# Patient Record
Sex: Male | Born: 1960 | Race: Black or African American | Hispanic: No | Smoking: Former smoker
Health system: Southern US, Community
[De-identification: ages and names within clinical notes are randomized; demographics above are authoritative.]

## PROBLEM LIST (undated history)

## (undated) ENCOUNTER — Emergency Department (HOSPITAL_COMMUNITY): Admission: EM | Payer: Medicaid Other | Source: Home / Self Care

## (undated) VITALS — BP 134/92 | HR 88 | Temp 97.7°F | Resp 18 | Ht 73.0 in | Wt 209.4 lb

## (undated) DIAGNOSIS — F259 Schizoaffective disorder, unspecified: Secondary | ICD-10-CM

## (undated) DIAGNOSIS — I10 Essential (primary) hypertension: Secondary | ICD-10-CM

## (undated) DIAGNOSIS — R569 Unspecified convulsions: Secondary | ICD-10-CM

## (undated) DIAGNOSIS — F3130 Bipolar disorder, current episode depressed, mild or moderate severity, unspecified: Secondary | ICD-10-CM

## (undated) DIAGNOSIS — F250 Schizoaffective disorder, bipolar type: Secondary | ICD-10-CM

## (undated) DIAGNOSIS — F419 Anxiety disorder, unspecified: Secondary | ICD-10-CM

## (undated) HISTORY — PX: HERNIA REPAIR: SHX51

## (undated) HISTORY — PX: CLOSED REDUCTION WITH HUMER PIN INSERTION: SHX5010

## (undated) HISTORY — PX: ANKLE SURGERY: SHX546

## (undated) NOTE — *Deleted (*Deleted)
Adult Psychoeducational Group Not Date:  01/31/2020 Time:  0900-1045 Group Topic/Focus: PROGRESSIVE RELAXATION. A group where deep breathing is taught and tensing and relaxation muscle groups is used. Imagery is used as well.  Pts are asked to imagine 3 pillars that hold them up when they are not able to hold themselves up.  Participation Level:  Active  Participation Quality:  Appropriate  Affect:  Appropriate  Cognitive:  Oriented  Insight: Improving  Engagement in Group:  Engaged  Modes of Intervention:  Activity, Discussion, Education, and Support  Additional Comments:  ***  [facility] A 01/31/2020`  

---

## 2011-09-02 ENCOUNTER — Emergency Department (HOSPITAL_COMMUNITY)
Admission: EM | Admit: 2011-09-02 | Discharge: 2011-09-04 | Disposition: A | Discharge status: Other Acute Inpatient Hospital | Payer: Self-pay | Attending: Emergency Medicine | Admitting: Emergency Medicine

## 2011-09-02 ENCOUNTER — Encounter (HOSPITAL_COMMUNITY): Payer: Self-pay | Admitting: Emergency Medicine

## 2011-09-02 DIAGNOSIS — F32A Depression, unspecified: Secondary | ICD-10-CM

## 2011-09-02 DIAGNOSIS — F3130 Bipolar disorder, current episode depressed, mild or moderate severity, unspecified: Secondary | ICD-10-CM | POA: Insufficient documentation

## 2011-09-02 DIAGNOSIS — F319 Bipolar disorder, unspecified: Secondary | ICD-10-CM

## 2011-09-02 DIAGNOSIS — R45851 Suicidal ideations: Secondary | ICD-10-CM | POA: Insufficient documentation

## 2011-09-02 DIAGNOSIS — F329 Major depressive disorder, single episode, unspecified: Secondary | ICD-10-CM

## 2011-09-02 DIAGNOSIS — I10 Essential (primary) hypertension: Secondary | ICD-10-CM | POA: Insufficient documentation

## 2011-09-02 HISTORY — DX: Bipolar disorder, current episode depressed, mild or moderate severity, unspecified: F31.30

## 2011-09-02 HISTORY — DX: Essential (primary) hypertension: I10

## 2011-09-02 HISTORY — DX: Schizoaffective disorder, bipolar type: F25.0

## 2011-09-02 HISTORY — DX: Schizoaffective disorder, unspecified: F25.9

## 2011-09-02 LAB — RAPID URINE DRUG SCREEN, HOSP PERFORMED
Amphetamines: NOT DETECTED
Barbiturates: NOT DETECTED
Benzodiazepines: NOT DETECTED
Cocaine: POSITIVE — AB
Opiates: NOT DETECTED
Tetrahydrocannabinol: POSITIVE — AB

## 2011-09-02 LAB — COMPREHENSIVE METABOLIC PANEL
ALT: 20 U/L (ref 0–53)
AST: 29 U/L (ref 0–37)
Albumin: 4 g/dL (ref 3.5–5.2)
Alkaline Phosphatase: 47 U/L (ref 39–117)
BUN: 23 mg/dL (ref 6–23)
CO2: 26 mEq/L (ref 19–32)
Calcium: 9 mg/dL (ref 8.4–10.5)
Chloride: 104 mEq/L (ref 96–112)
Creatinine, Ser: 0.99 mg/dL (ref 0.50–1.35)
GFR calc Af Amer: >90 mL/min (ref >90)
GFR calc non Af Amer: >90 mL/min (ref >90)
Glucose, Bld: 103 mg/dL — ABNORMAL HIGH (ref 70–99)
Potassium: 3.9 mEq/L (ref 3.5–5.1)
Sodium: 139 mEq/L (ref 135–145)
Total Bilirubin: 0.4 mg/dL (ref 0.3–1.2)
Total Protein: 7.2 g/dL (ref 6.0–8.3)

## 2011-09-02 LAB — DIFFERENTIAL
Basophils Absolute: 0 10*3/uL (ref 0.0–0.1)
Basophils Relative: 0 % (ref 0–1)
Eosinophils Absolute: 0.1 10*3/uL (ref 0.0–0.7)
Eosinophils Relative: 3 % (ref 0–5)
Lymphocytes Relative: 28 % (ref 12–46)
Lymphs Abs: 1 10*3/uL (ref 0.7–4.0)
Monocytes Absolute: 0.6 10*3/uL (ref 0.1–1.0)
Monocytes Relative: 15 % — ABNORMAL HIGH (ref 3–12)
Neutro Abs: 2 10*3/uL (ref 1.7–7.7)
Neutrophils Relative %: 54 % (ref 43–77)

## 2011-09-02 LAB — CBC
HCT: 45.2 % (ref 39.0–52.0)
Hemoglobin: 15.4 g/dL (ref 13.0–17.0)
MCH: 26.8 pg (ref 26.0–34.0)
MCHC: 34.1 g/dL (ref 30.0–36.0)
MCV: 78.6 fL (ref 78.0–100.0)
Platelets: 247 10*3/uL (ref 150–400)
RBC: 5.75 MIL/uL (ref 4.22–5.81)
RDW: 14.4 % (ref 11.5–15.5)
WBC: 3.6 10*3/uL — ABNORMAL LOW (ref 4.0–10.5)

## 2011-09-02 LAB — URINALYSIS, ROUTINE W REFLEX MICROSCOPIC
Glucose, UA: NEGATIVE mg/dL
Hgb urine dipstick: NEGATIVE
Ketones, ur: NEGATIVE mg/dL
Leukocytes, UA: NEGATIVE
Nitrite: NEGATIVE
Protein, ur: NEGATIVE mg/dL
Specific Gravity, Urine: 1.033 — ABNORMAL HIGH (ref 1.005–1.030)
Urobilinogen, UA: 1 mg/dL (ref 0.0–1.0)
pH: 5.5 (ref 5.0–8.0)

## 2011-09-02 LAB — ETHANOL: Alcohol, Ethyl (B): <11 mg/dL (ref 0–11)

## 2011-09-02 MED ORDER — HALOPERIDOL 2 MG PO TABS
2.0000 mg | ORAL_TABLET | Freq: Two times a day (BID) | ORAL | Status: DC
  Administered 2011-09-02 – 2011-09-04 (×4): 2 mg via ORAL
  Filled 2011-09-02 (×4): qty 1

## 2011-09-02 MED ORDER — DIVALPROEX SODIUM 250 MG PO DR TAB
250.0000 mg | DELAYED_RELEASE_TABLET | Freq: Three times a day (TID) | ORAL | Status: DC
  Administered 2011-09-02: 250 mg via ORAL
  Filled 2011-09-02 (×3): qty 1

## 2011-09-02 MED ORDER — ACETAMINOPHEN 325 MG PO TABS: 650.0000 mg | ORAL_TABLET | ORAL | Status: DC | PRN

## 2011-09-02 MED ORDER — GABAPENTIN 300 MG PO CAPS
300.0000 mg | ORAL_CAPSULE | Freq: Three times a day (TID) | ORAL | Status: DC
  Administered 2011-09-02 – 2011-09-04 (×7): 300 mg via ORAL
  Filled 2011-09-02 (×11): qty 1

## 2011-09-02 MED ORDER — ALUM & MAG HYDROXIDE-SIMETH 200-200-20 MG/5ML PO SUSP: 30.0000 mL | ORAL | Status: DC | PRN

## 2011-09-02 MED ORDER — CLONIDINE HCL 0.1 MG PO TABS
0.2000 mg | ORAL_TABLET | Freq: Two times a day (BID) | ORAL | Status: DC
  Administered 2011-09-02 – 2011-09-04 (×5): 0.2 mg via ORAL
  Filled 2011-09-02 (×3): qty 2
  Filled 2011-09-02: qty 1
  Filled 2011-09-02: qty 2
  Filled 2011-09-02: qty 1

## 2011-09-02 MED ORDER — ZOLPIDEM TARTRATE 5 MG PO TABS
5.0000 mg | ORAL_TABLET | Freq: Every evening | ORAL | Status: DC | PRN
  Administered 2011-09-03: 5 mg via ORAL
  Filled 2011-09-02: qty 1

## 2011-09-02 MED ORDER — BENZTROPINE MESYLATE 1 MG PO TABS
0.5000 mg | ORAL_TABLET | Freq: Two times a day (BID) | ORAL | Status: DC
  Administered 2011-09-02 (×2): 0.5 mg via ORAL
  Filled 2011-09-02 (×3): qty 1

## 2011-09-02 MED ORDER — LORAZEPAM 1 MG PO TABS: 1.0000 mg | ORAL_TABLET | Freq: Three times a day (TID) | ORAL | Status: DC | PRN

## 2011-09-02 MED ORDER — IBUPROFEN 600 MG PO TABS: 600.0000 mg | ORAL_TABLET | Freq: Three times a day (TID) | ORAL | Status: DC | PRN

## 2011-09-02 MED ORDER — PANTOPRAZOLE SODIUM 40 MG PO TBEC
40.0000 mg | DELAYED_RELEASE_TABLET | Freq: Every day | ORAL | Status: DC
  Administered 2011-09-02 – 2011-09-04 (×3): 40 mg via ORAL
  Filled 2011-09-02 (×3): qty 1

## 2011-09-02 MED ORDER — BENZTROPINE MESYLATE 1 MG/ML IJ SOLN: 1.0000 mg | Freq: Two times a day (BID) | INTRAMUSCULAR | Status: DC

## 2011-09-02 MED ORDER — ONDANSETRON HCL 4 MG PO TABS: 4.0000 mg | ORAL_TABLET | Freq: Three times a day (TID) | ORAL | Status: DC | PRN

## 2011-09-02 MED ORDER — NICOTINE 7 MG/24HR TD PT24
7.0000 mg | MEDICATED_PATCH | Freq: Every day | TRANSDERMAL | Status: DC
  Administered 2011-09-02 – 2011-09-04 (×3): 7 mg via TRANSDERMAL
  Filled 2011-09-02 (×3): qty 1

## 2011-09-02 MED ORDER — DIVALPROEX SODIUM 500 MG PO DR TAB
500.0000 mg | DELAYED_RELEASE_TABLET | Freq: Two times a day (BID) | ORAL | Status: DC
  Administered 2011-09-02 – 2011-09-04 (×4): 500 mg via ORAL
  Filled 2011-09-02 (×4): qty 1

## 2011-09-02 NOTE — ED Notes (Addendum)
Pt has h/o of bipolar and schizophrenia, has not taken his medications in several weeks d/t inability to afford them.  Pt called [facility] to [facility] to [facility] unable to accept him and sent him here.  Pt here voluntarily, pt calm and cooperative at this time. Pt reports SI this am without plan, denies HI.

## 2011-09-02 NOTE — ED Provider Notes (Signed)
Pt had telepsych consult.  He rec'd admission.  Continue IVC papers.  He rec'd cogentin, haldol, and vpa.  I ordered all of these. Act consulted for placement.  Cheri Guppy, MD 09/02/11 2123

## 2011-09-02 NOTE — ED Notes (Signed)
Up to the desk on the phone 
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
## 2011-09-02 NOTE — ED Notes (Signed)
Pt to BR to give urine specimen

## 2011-09-02 NOTE — BHH Counselor (Signed)
Trisha Mangle MD telepsych consult recommends inpatient treatment and continue IVC. However, pt is not under IVC. EDP made aware of recommendation and Trisha Mangle incorrectly thinking pt under IVC.Marland Kitchen

## 2011-09-02 NOTE — ED Provider Notes (Signed)
History     CSN: [id number]  Arrival date & time 09/02/11  1039   First MD Initiated Contact with Patient 09/02/11 1059      Chief Complaint  Patient presents with  . V70.1    (Consider location/radiation/quality/duration/timing/severity/associated sxs/prior treatment) The history is provided by the patient. The history is limited by the condition of the patient.   51 year old male comes in because of suicidal ideation. He states that he relocated here recently and out of his medications 2 weeks ago. He has not taken anything during that time. He started having suicidal ideation last night. He states that he had a knife and was going to cut himself with a knife was taken away from him. He has had symptoms of depression with early morning awakening, crying spells, and anhedonia. He has had visual hallucinations seeing his mother and grandmother for both red. He has had auditory hallucinations with voices telling him to kill himself. He admits to drinking 4 beers last night each which were 24 ounces and also using marijuana last night. He denies other drug use. He is here voluntarily and mainly wants to get back on his medications.  Past Medical History  Diagnosis Date  . Bipolar affective disorder, depressed   . Schizophrenia, schizo-affective   . Hypertension     History reviewed. No pertinent past surgical history.  History reviewed. No pertinent family history.  History  Substance Use Topics  . Smoking status: Not on file  . Smokeless tobacco: Not on file  . Alcohol Use:       Review of Systems  All other systems reviewed and are negative.    Allergies  Review of patient's allergies indicates no known allergies.  Home Medications   Current Outpatient Rx  Name Route Sig Dispense Refill  . BENZTROPINE MESYLATE 0.5 MG PO TABS Oral Take 0.5 mg by mouth 2 (two) times daily.    Marland Kitchen CLONIDINE HCL 0.2 MG PO TABS Oral Take 0.2 mg by mouth 2 (two) times daily.    Marland Kitchen  DIVALPROEX SODIUM 250 MG PO TBEC Oral Take 250 mg by mouth 3 (three) times daily.    Marland Kitchen GABAPENTIN 300 MG PO CAPS Oral Take 300 mg by mouth 3 (three) times daily.      BP 159/93  Temp 98.3 F (36.8 C) (Oral)  Resp 14  Ht 6\' 1"  (1.854 m)  Wt 215 lb (97.523 kg)  BMI 28.37 kg/m2  SpO2 98%  Physical Exam  Nursing note and vitals reviewed.  51 year old male who is pleasant and alert and in no acute distress. Vital signs are significant for mild hypertension with blood pressure 159/93. Oxygen saturation is 98% which is normal. Head is normocephalic and atraumatic. PERRLA, EOMI. Oropharynx is clear. Neck is nontender and supple. Back is nontender. Lungs are clear without rales, wheezes, or rhonchi. Heart has regular rate and rhythm without murmur. Abdomen is soft, flat, nontender without masses or hepatosplenomegaly. Extremities have no cyanosis or edema, full range of motion is present. Skin is warm and dry without rash. Neurologic: He is awake, alert, oriented x3, cranial nerves are intact,  there are no motor or sensory deficits. Psychiatric: He appears mildly depressed.  ED Course  Procedures (including critical care time)  Results for orders placed during the hospital encounter of 09/02/11  CBC      Component Value Range   WBC 3.6 (*) 4.0 - 10.5 K/uL   RBC 5.75  4.22 - 5.81 MIL/uL   Hemoglobin 15.4  13.0 - 17.0 g/dL   HCT 96.0  45.4 - 09.8 %   MCV 78.6  78.0 - 100.0 fL   MCH 26.8  26.0 - 34.0 pg   MCHC 34.1  30.0 - 36.0 g/dL   RDW 11.9  14.7 - 82.9 %   Platelets 247  150 - 400 K/uL  DIFFERENTIAL      Component Value Range   Neutrophils Relative 54  43 - 77 %   Neutro Abs 2.0  1.7 - 7.7 K/uL   Lymphocytes Relative 28  12 - 46 %   Lymphs Abs 1.0  0.7 - 4.0 K/uL   Monocytes Relative 15 (*) 3 - 12 %   Monocytes Absolute 0.6  0.1 - 1.0 K/uL   Eosinophils Relative 3  0 - 5 %   Eosinophils Absolute 0.1  0.0 - 0.7 K/uL   Basophils Relative 0  0 - 1 %   Basophils Absolute 0.0  0.0 -  0.1 K/uL  COMPREHENSIVE METABOLIC PANEL      Component Value Range   Sodium 139  135 - 145 mEq/L   Potassium 3.9  3.5 - 5.1 mEq/L   Chloride 104  96 - 112 mEq/L   CO2 26  19 - 32 mEq/L   Glucose, Bld 103 (*) 70 - 99 mg/dL   BUN 23  6 - 23 mg/dL   Creatinine, Ser 5.62  0.50 - 1.35 mg/dL   Calcium 9.0  8.4 - 13.0 mg/dL   Total Protein 7.2  6.0 - 8.3 g/dL   Albumin 4.0  3.5 - 5.2 g/dL   AST 29  0 - 37 U/L   ALT 20  0 - 53 U/L   Alkaline Phosphatase 47  39 - 117 U/L   Total Bilirubin 0.4  0.3 - 1.2 mg/dL   GFR calc non Af Amer >90  >90 mL/min   GFR calc Af Amer >90  >90 mL/min  ETHANOL      Component Value Range   Alcohol, Ethyl (B) <11  0 - 11 mg/dL  URINE RAPID DRUG SCREEN (HOSP PERFORMED)      Component Value Range   Opiates NONE DETECTED  NONE DETECTED   Cocaine POSITIVE (*) NONE DETECTED   Benzodiazepines NONE DETECTED  NONE DETECTED   Amphetamines NONE DETECTED  NONE DETECTED   Tetrahydrocannabinol POSITIVE (*) NONE DETECTED   Barbiturates NONE DETECTED  NONE DETECTED  URINALYSIS, ROUTINE W REFLEX MICROSCOPIC      Component Value Range   Color, Urine AMBER (*) YELLOW   APPearance CLEAR  CLEAR   Specific Gravity, Urine 1.033 (*) 1.005 - 1.030   pH 5.5  5.0 - 8.0   Glucose, UA NEGATIVE  NEGATIVE mg/dL   Hgb urine dipstick NEGATIVE  NEGATIVE   Bilirubin Urine SMALL (*) NEGATIVE   Ketones, ur NEGATIVE  NEGATIVE mg/dL   Protein, ur NEGATIVE  NEGATIVE mg/dL   Urobilinogen, UA 1.0  0.0 - 1.0 mg/dL   Nitrite NEGATIVE  NEGATIVE   Leukocytes, UA NEGATIVE  NEGATIVE   No results found.    1. Suicidal ideation   2. Depression   3. Bipolar disorder   4. Hypertension       MDM  Depression with suicidal ideation and visual and auditory hallucinations. He he'll be placed in the psychiatric emergency department and restarted on his medications. Psychiatry and ACT Team consultations will be obtained. He has no prior records and the [facility]  system.  Dione Booze, MD 09/03/11 1100

## 2011-09-02 NOTE — ED Notes (Signed)
telepsych in progress 

## 2011-09-03 MED ORDER — BENZTROPINE MESYLATE 1 MG PO TABS
1.0000 mg | ORAL_TABLET | Freq: Two times a day (BID) | ORAL | Status: DC
  Administered 2011-09-03 – 2011-09-04 (×3): 1 mg via ORAL
  Filled 2011-09-03 (×2): qty 1

## 2011-09-03 NOTE — ED Notes (Signed)
Pt attended [facility] ED group facilitated by Chaplain Burnis Kingfisher and PhD Counseling intern Juanetta Beets.    Group focused on concept of "journey," encouraging pt's to conceptualize their hospitalization in a larger narrative context and describe concrete next step.   After introductions brief check-in, Pt's worked to [employer] connected with the concept of journey and described how these words connected with their own narrative.  Pt's then selected a photograph and spoke about how this picture symbolizes their journey.  Pt's together decided on a theme of longing for peace in their journey.     Pt introduced self to group and described history of incarceration.  He spoke with group about difficulty finding stability and his desire to be in a safe and stable place.  Described being off his medication and that he was hearing voices. Pt reported that the photographs that were on the table in the room were prompting some voices in his head.  Pt moved to other side of room when group changed to work on an exercise.   Pt remained in group throughout session, but did not engage except when facilitators checked in with him.  He reported that he was feeling ill and was sick to his stomach.    Belva Crome  MDiv, Chaplain
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
## 2011-09-03 NOTE — ED Notes (Signed)
When asked, pt states he has a hx of seizures with the last one being 1.5 to 2 months ago.  Also states he is currently seeing "dead people that he knows"

## 2011-09-03 NOTE — ED Notes (Signed)
Pt reports he is having racing thoughts and AH telling him to hurt himself while he is sleeping, pt reports he wakes up out of his sleep and just stays awake, pt states he is not going to act on the thoughts. Pt pleasant, cooperative in room watching television

## 2011-09-04 ENCOUNTER — Encounter (HOSPITAL_COMMUNITY): Payer: Self-pay | Admitting: *Deleted

## 2011-09-04 ENCOUNTER — Inpatient Hospital Stay (HOSPITAL_COMMUNITY)
Admission: AD | Admit: 2011-09-04 | Discharge: 2011-09-10 | DRG: 897 | Disposition: A | Discharge status: Home / Self Care | Payer: Federal, State, Local not specified - Other | Source: Ambulatory Visit | Attending: Psychiatry | Admitting: Psychiatry

## 2011-09-04 DIAGNOSIS — F1220 Cannabis dependence, uncomplicated: Secondary | ICD-10-CM | POA: Diagnosis present

## 2011-09-04 DIAGNOSIS — G40802 Other epilepsy, not intractable, without status epilepticus: Secondary | ICD-10-CM | POA: Diagnosis present

## 2011-09-04 DIAGNOSIS — Z79899 Other long term (current) drug therapy: Secondary | ICD-10-CM

## 2011-09-04 DIAGNOSIS — F1910 Other psychoactive substance abuse, uncomplicated: Secondary | ICD-10-CM

## 2011-09-04 DIAGNOSIS — F1020 Alcohol dependence, uncomplicated: Secondary | ICD-10-CM | POA: Diagnosis present

## 2011-09-04 DIAGNOSIS — F1410 Cocaine abuse, uncomplicated: Secondary | ICD-10-CM | POA: Diagnosis present

## 2011-09-04 DIAGNOSIS — F39 Unspecified mood [affective] disorder: Secondary | ICD-10-CM

## 2011-09-04 DIAGNOSIS — F1994 Other psychoactive substance use, unspecified with psychoactive substance-induced mood disorder: Principal | ICD-10-CM | POA: Diagnosis present

## 2011-09-04 DIAGNOSIS — I10 Essential (primary) hypertension: Secondary | ICD-10-CM | POA: Diagnosis present

## 2011-09-04 DIAGNOSIS — F1920 Other psychoactive substance dependence, uncomplicated: Secondary | ICD-10-CM | POA: Diagnosis present

## 2011-09-04 HISTORY — DX: Anxiety disorder, unspecified: F41.9

## 2011-09-04 HISTORY — DX: Unspecified convulsions: R56.9

## 2011-09-04 MED ORDER — CHLORDIAZEPOXIDE HCL 25 MG PO CAPS
25.0000 mg | ORAL_CAPSULE | Freq: Four times a day (QID) | ORAL | Status: AC | PRN
  Administered 2011-09-06 – 2011-09-07 (×2): 25 mg via ORAL
  Filled 2011-09-04 (×2): qty 1

## 2011-09-04 MED ORDER — HYDROXYZINE HCL 25 MG PO TABS
25.0000 mg | ORAL_TABLET | Freq: Four times a day (QID) | ORAL | Status: AC | PRN
  Administered 2011-09-05 – 2011-09-06 (×3): 25 mg via ORAL

## 2011-09-04 MED ORDER — VITAMIN B-1 100 MG PO TABS
100.0000 mg | ORAL_TABLET | Freq: Every day | ORAL | Status: DC
  Administered 2011-09-05 – 2011-09-10 (×6): 100 mg via ORAL
  Filled 2011-09-04 (×7): qty 1

## 2011-09-04 MED ORDER — NICOTINE 14 MG/24HR TD PT24
14.0000 mg | MEDICATED_PATCH | Freq: Every day | TRANSDERMAL | Status: DC
  Administered 2011-09-05 – 2011-09-10 (×6): 14 mg via TRANSDERMAL
  Filled 2011-09-04 (×9): qty 1

## 2011-09-04 MED ORDER — LOPERAMIDE HCL 2 MG PO CAPS: 2.0000 mg | ORAL_CAPSULE | ORAL | Status: AC | PRN

## 2011-09-04 MED ORDER — MAGNESIUM HYDROXIDE 400 MG/5ML PO SUSP
30.0000 mL | Freq: Every day | ORAL | Status: DC | PRN
Start: 2011-09-04 — End: 2011-09-10

## 2011-09-04 MED ORDER — ADULT MULTIVITAMIN W/MINERALS CH
1.0000 | ORAL_TABLET | Freq: Every day | ORAL | Status: DC
  Administered 2011-09-05 – 2011-09-07 (×3): 1 via ORAL
  Administered 2011-09-08: 08:00:00 via ORAL
  Administered 2011-09-09 – 2011-09-10 (×2): 1 via ORAL
  Filled 2011-09-04 (×8): qty 1

## 2011-09-04 MED ORDER — CLONIDINE HCL 0.2 MG PO TABS
0.2000 mg | ORAL_TABLET | Freq: Two times a day (BID) | ORAL | Status: DC
  Administered 2011-09-04 – 2011-09-10 (×12): 0.2 mg via ORAL
  Filled 2011-09-04 (×7): qty 1
  Filled 2011-09-04: qty 2
  Filled 2011-09-04 (×7): qty 1

## 2011-09-04 MED ORDER — DIVALPROEX SODIUM 250 MG PO DR TAB
250.0000 mg | DELAYED_RELEASE_TABLET | Freq: Three times a day (TID) | ORAL | Status: DC
  Administered 2011-09-05 – 2011-09-10 (×17): 250 mg via ORAL
  Filled 2011-09-04 (×19): qty 1

## 2011-09-04 MED ORDER — ESCITALOPRAM OXALATE 20 MG PO TABS
20.0000 mg | ORAL_TABLET | Freq: Every day | ORAL | Status: DC
  Administered 2011-09-05: 20 mg via ORAL
  Filled 2011-09-04 (×2): qty 1

## 2011-09-04 MED ORDER — CLONIDINE HCL 0.2 MG PO TABS: 0.2000 mg | ORAL_TABLET | Freq: Three times a day (TID) | ORAL | Status: DC

## 2011-09-04 MED ORDER — ACETAMINOPHEN 325 MG PO TABS
650.0000 mg | ORAL_TABLET | Freq: Four times a day (QID) | ORAL | Status: DC | PRN
Start: 2011-09-04 — End: 2011-09-10
  Administered 2011-09-05: 650 mg via ORAL

## 2011-09-04 MED ORDER — ALUM & MAG HYDROXIDE-SIMETH 200-200-20 MG/5ML PO SUSP: 30.0000 mL | ORAL | Status: DC | PRN

## 2011-09-04 MED ORDER — THIAMINE HCL 100 MG/ML IJ SOLN
100.0000 mg | Freq: Once | INTRAMUSCULAR | Status: AC
  Administered 2011-09-04: 100 mg via INTRAMUSCULAR

## 2011-09-04 MED ORDER — BENZTROPINE MESYLATE 0.5 MG PO TABS
0.5000 mg | ORAL_TABLET | Freq: Two times a day (BID) | ORAL | Status: DC
  Administered 2011-09-05 – 2011-09-10 (×11): 0.5 mg via ORAL
  Filled 2011-09-04 (×13): qty 1

## 2011-09-04 MED ORDER — ONDANSETRON 4 MG PO TBDP: 4.0000 mg | ORAL_TABLET | Freq: Four times a day (QID) | ORAL | Status: AC | PRN

## 2011-09-04 MED ORDER — GABAPENTIN 300 MG PO CAPS
300.0000 mg | ORAL_CAPSULE | Freq: Three times a day (TID) | ORAL | Status: DC
  Administered 2011-09-05 – 2011-09-10 (×17): 300 mg via ORAL
  Filled 2011-09-04 (×19): qty 1

## 2011-09-04 NOTE — Tx Team (Addendum)
Initial Interdisciplinary Treatment Plan  PATIENT STRENGTHS: (choose at least two) Ability for insight Average or above average intelligence Capable of independent living Communication skills General fund of knowledge Motivation for treatment/growth Physical Health Supportive family/friends  PATIENT STRESSORS: Financial difficulties Medication change or noncompliance Substance abuse   PROBLEM LIST: Problem List/Patient Goals Date to be addressed Date deferred Reason deferred Estimated date of resolution  Depression      SI      Anxiety      Polysubstance abuse      Thought Disorder                               DISCHARGE CRITERIA:  Ability to meet basic life and health needs Adequate post-discharge living arrangements Improved stabilization in mood, thinking, and/or behavior Medical problems require only outpatient monitoring Motivation to continue treatment in a less acute level of care Need for constant or close observation no longer present Reduction of life-threatening or endangering symptoms to within safe limits Safe-care adequate arrangements made Verbal commitment to aftercare and medication compliance Withdrawal symptoms are absent or subacute and managed without 24-hour nursing intervention  PRELIMINARY DISCHARGE PLAN: Attend 12-step recovery group Outpatient therapy  PATIENT/FAMIILY INVOLVEMENT: This treatment plan has been presented to and reviewed with the patient, [facility].  The patient has been given the opportunity to ask questions and make suggestions.  Arturo Morton 09/04/2011, 8:49 PM

## 2011-09-04 NOTE — ED Provider Notes (Signed)
  Physical Exam  BP 129/82  Pulse 53  Temp 98.2 F (36.8 C) (Oral)  Resp 18  Ht 6\' 1"  (1.854 m)  Wt 215 lb (97.523 kg)  BMI 28.37 kg/m2  SpO2 100%  Physical Exam  ED Course  Procedures  MDM Accepted at behavioral health. Dr readling      Harrold Donath R. Rubin Payor, MD 09/04/11 1751
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
## 2011-09-04 NOTE — BHH Counselor (Cosign Needed)
Referred to [facility] and declined due to patient acuity. Pt accepted by Dr. Harvie Heck Readling to Dr. Koren Shiver.  Pending a 400 hall bed.

## 2011-09-04 NOTE — Consult Note (Signed)
Reason for Consult: Polysubstance abuse versus dependence noncompliance with treatment schizophrenia, versus schizoaffective disorder Referring Physician: Dr. Alexia Freestone is an 51 y.o. male.  HPI: He patient was seen and chart reviewed. Patient reported he has been suffering with the chronic psychiatric illness, since he was living in [address] [place], [place], and was hospitalized years ago. Patient reportedly relocated to [place], [place], and was hospitalized at [facility] for 28 days and than transferred to this central regional hospitalization where he stayed another  28 days before he was released to his sister's home. Patient reported to much stress over her sister's home, so he was relocated to [place], staying with his cousin for last 2 weeks. Patient stopped taking his medications because he ran out and started using drugs by hanging around with the wrong crowd. Patient has history of for multi-year incarceration for multi-legal troubles. Patient reportedly has no current pending charges. Patient is suffering with the emotional problems including racing thoughts, lack of focus, getting frustrated, tearful, and requesting to stabilize on his medication management and help with a detox and rehabilitation services. Patient feels he was not helped on several places because of his legal history. He patient seems to be genuine and honest when he is giving the information about his past.   Past Medical History  Diagnosis Date  . Bipolar affective disorder, depressed   . Schizophrenia, schizo-affective   . Hypertension     History reviewed. No pertinent past surgical history.  History reviewed. No pertinent family history.  Social History:  does not have a smoking history on file. He does not have any smokeless tobacco history on file. He reports that he drinks alcohol. He reports that he uses illicit drugs (Marijuana and Cocaine).  Allergies: No Known  Allergies  Medications: I have reviewed the patient's current medications.  No results found for this or any previous visit (from the past 48 hour(s)).  No results found.  Positive for anxiety, bad mood, behavior problems, bipolar, depression, excessive alcohol consumption, illegal drug usage, mood swings and I polysubstance abuse versus dependence, and legal problems in the past Blood pressure 129/82, pulse 53, temperature 98.2 F (36.8 C), temperature source Oral, resp. rate 18, height 6\' 1"  (1.854 m), weight 215 lb (97.523 kg), SpO2 100.00%.   Assessment/Plan: Polysubstance abuse versus dependence. Schizophrenia, versus schizoaffective disorder, by history.  Recommended admission to the acute psychiatric hospitalization for safety and seclude therapeutic milieu and stabilization. Patient may need rehabilitation services upon discharge from the behavioral Health Center.  Callista Hoh,JANARDHAHA R. 09/04/2011, 5:15 PM

## 2011-09-04 NOTE — Progress Notes (Signed)
Behavioral Health Group  Co-facilitated behavioral health group w/ Corliss Marcus, MA, MEd, LPCA, NCC, for pt's in [facility]. Group was open and engaged w/ mutual sharing and support. Group focused on grief and loss, specifically losses related to mental health and substance use and how to work through grief processes associated with these losses.  Pt was progressively engaged in the group. Pt shared that he lost his mother to cancer about 16y ago and "I still haven't grieved the loss." Pt reports that he has been in prison for many yrs and now that he is out, has no idea how to survive. Pt stated "I know the basics but that's about it and the way you survive in prison ain't how you survive out here." Pt reports that he has nowhere to go, occ hears voices, and recently began drug use and ETOH to stop voices. Pt stated several times, "I'm not ready to go back to where I was before I came in here" and "I need to go somewhere inpatient to get some help." Pt stated he does not have coping skills to cope and is trying to reach out for help through [facility]. Pt related to others stories who shared about losing loved ones to cancer as well as empathizing w/ other members who stated they felt lost and scared.  Arilynn Blakeney B MS, LPCA, NCC

## 2011-09-04 NOTE — Progress Notes (Signed)
Pt has been accepted to [facility] for treatment by Dr. Allena Katz, bed 301-2. EDP has been notified and is in agreement with disposition. RN made aware to call report. All support paper work has been completed and faxed to [facility] for review. Pt is in agreement to transfer to [facility]. No further needs identified at this time.

## 2011-09-05 DIAGNOSIS — F39 Unspecified mood [affective] disorder: Secondary | ICD-10-CM

## 2011-09-05 LAB — VALPROIC ACID LEVEL: Valproic Acid Lvl: 40.1 ug/mL — ABNORMAL LOW (ref 50.0–100.0)

## 2011-09-05 LAB — TSH: TSH: 2.065 u[IU]/mL (ref 0.350–4.500)

## 2011-09-05 MED ORDER — CITALOPRAM HYDROBROMIDE 20 MG PO TABS
20.0000 mg | ORAL_TABLET | Freq: Every day | ORAL | Status: DC
  Administered 2011-09-05 – 2011-09-10 (×6): 20 mg via ORAL
  Filled 2011-09-05 (×8): qty 1

## 2011-09-05 NOTE — H&P (Signed)
Psychiatric Admission Assessment Adult  Patient Identification:  Adrian Alexander Date of Evaluation:  09/05/2011 Chief Complaint:  Polysubstance Abuse; Schizoaffective Disorder, Unspecified History of Present Illness:  This is a voluntary admission for this 51 year old AAM from [place] who has been in this area for only a few weeks.  He states he is suicidal due to being off of his medications that he normally takes.  He states he is trying to get established with [facility] and was unable to do so yet.  He is voicing auditory hallucinations and suicidal ideation.  These symptoms have worsened with the use of alcohol and cocaine.  He says he drank all night last night and only used cocaine x1.        Prior to admission he reports having poor sleep, decreased appetite, increasing depression and anhedonia, he states he is suicidal and has a plan to cut himself with a knife.  He admits he has made previous attempts at cutting his wrists.  He rates his anxiety as 10/10 and states he does have Auditory hallucinations recently as of yesterday but he could not make them out.  He denies visual hallucinations.  He notes increased hopelessness as well.       He does note seizure disorder and reports seizures with alcohol withdrawal in the past.  He states his last seizure was a month ago.  Past Psychiatric History: Diagnosis:  Schizoaffective disorder with substance abuse  Hospitalizations: 4 in the last year.  Outpatient Care: initiated at [facility]  Substance Abuse Care:  Self-Mutilation:   Suicidal Attempts: previously cut his wrists.  Violent Behaviors:   Past Medical History:   Past Medical History  . Hypertension   . Seizures     Last month. gets a sensation   Seizure History:  see HPI Allergies:  No Known Allergies PTA Medications: Prescriptions prior to admission  Medication Sig Dispense Refill  . benztropine (COGENTIN) 0.5 MG tablet Take 0.5 mg by mouth 2 (two) times daily.      . cloNIDine  (CATAPRES) 0.2 MG tablet Take 0.2 mg by mouth 2 (two) times daily.      . divalproex (DEPAKOTE) 250 MG DR tablet Take 250 mg by mouth 3 (three) times daily.      Marland Kitchen gabapentin (NEURONTIN) 300 MG capsule Take 300 mg by mouth 3 (three) times daily.        Previous Psychotropic Medications:  Medication/Dose                 Substance Abuse History in the last 12 months: Substance Age of 1st Use Last Use Amount Specific Type  Nicotine      Alcohol      Cannabis      Opiates      Cocaine      Methamphetamines      LSD      Ecstasy      Benzodiazepines      Caffeine      Inhalants      Others:                         Consequences of Substance Abuse:   Social History: Current Place of Residence:   Place of Birth:   Family Members: Marital Status:  Separated Children:  Sons:  Daughters: Relationships: Education:  4th grade Educational Problems/Performance: Religious Beliefs/Practices: History of Abuse (Emotional/Phsycial/Sexual) Occupational Experiences; Military History:  None. Legal History: Hobbies/Interests:  Family History:  No  family history on file. ROS:  Negative with the exception of the HPI. PE; Completed by the MD in the ED.  I have reviewed those findings and evaluated the patient and agree with those findings.  Mental Status Examination/Evaluation: Objective:  Appearance: Disheveled  Eye Contact::  Fair  Speech:  Clear and Coherent  Volume:  Normal  Mood:  Anxious and Depressed  Affect:  Congruent  Thought Process:  Goal Directed and Linear  Orientation:  Full  Thought Content:  WDL  Suicidal Thoughts:  No  Homicidal Thoughts:  No  Memory:  Immediate;   Fair  Judgement:  Poor  Insight:  Lacking  Psychomotor Activity:  Normal  Concentration:  Fair  Recall:  Fair  Akathisia:  No  Handed:    AIMS (if indicated):     Assets:  Communication Skills Desire for Improvement  Sleep:  Number of Hours: 6.5     Laboratory/X-Ray Psychological  Evaluation(s)  UDS-cocaine, THC BAL<11 CMP-unremarkable Valproic Acid-40       Assessment:    AXIS I:  Alcohol Dependence; Cocaine Abuse; Cannabis Dependence; BPAD vs. Schizoaffective Disorder, per Hx; Seizure Disorder NOS; HTN AXIS II:  Deferred AXIS III:   Past Medical History  Diagnosis Date  . Bipolar affective disorder, depressed   . Schizophrenia, schizo-affective   . Hypertension   . Anxiety   . Seizures     Last month. gets a sensation   AXIS IV:  housing problems, occupational problems, problems related to social environment, problems with access to health care services and problems with primary support group AXIS V:  51-60 moderate symptoms  Treatment Recommendations Summary: 1. Admit for crisis management and stabilization. 2. Detox as needed. 3. Start medications as ordered. 4. Address concerns for continued health and mental health management upon discharge. 5. Develop treatment plan to decrease risk of relapse or readmission.  Treatment Plan: 1.  Restart home medications as ordered. 2. Monitor progress. 3. Monitor for need for detox protocol based on CIWA and patient's symptoms. 4. Treat health problems as needed. 5. Establish plan for continued care and follow up on discharge. Current Medications:  Current Facility-Administered Medications  Medication Dose Route Frequency Provider Last Rate Last Dose  . acetaminophen (TYLENOL) tablet 650 mg  650 mg Oral Q6H PRN Mickeal Skinner, MD      . alum & mag hydroxide-simeth (MAALOX/MYLANTA) 200-200-20 MG/5ML suspension 30 mL  30 mL Oral Q4H PRN Mickeal Skinner, MD      . benztropine (COGENTIN) tablet 0.5 mg  0.5 mg Oral BID Mickeal Skinner, MD   0.5 mg at 09/05/11 0759  . chlordiazePOXIDE (LIBRIUM) capsule 25 mg  25 mg Oral Q6H PRN Mickeal Skinner, MD      . citalopram (CELEXA) tablet 20 mg  20 mg Oral Daily Alyson Kuroski-Mazzei, DO   20 mg at 09/05/11 1205  . cloNIDine (CATAPRES) tablet 0.2 mg  0.2 mg Oral BID  Mickeal Skinner, MD   0.2 mg at 09/05/11 0758  . divalproex (DEPAKOTE) DR tablet 250 mg  250 mg Oral TID PC Mickeal Skinner, MD   250 mg at 09/05/11 1205  . gabapentin (NEURONTIN) capsule 300 mg  300 mg Oral TID Mickeal Skinner, MD   300 mg at 09/05/11 1205  . hydrOXYzine (ATARAX/VISTARIL) tablet 25 mg  25 mg Oral Q6H PRN Mickeal Skinner, MD      . loperamide (IMODIUM) capsule 2-4 mg  2-4 mg Oral PRN Mickeal Skinner, MD      . magnesium hydroxide (  MILK OF MAGNESIA) suspension 30 mL  30 mL Oral Daily PRN Mickeal Skinner, MD      . multivitamin with minerals tablet 1 tablet  1 tablet Oral Daily Mickeal Skinner, MD   1 tablet at 09/05/11 0758  . nicotine (NICODERM CQ - dosed in mg/24 hours) patch 14 mg  14 mg Transdermal Q0600 Mickeal Skinner, MD   14 mg at 09/05/11 0600  . ondansetron (ZOFRAN-ODT) disintegrating tablet 4 mg  4 mg Oral Q6H PRN Mickeal Skinner, MD      . thiamine (B-1) injection 100 mg  100 mg Intramuscular Once Mickeal Skinner, MD   100 mg at 09/04/11 2200  . thiamine (VITAMIN B-1) tablet 100 mg  100 mg Oral Daily Mickeal Skinner, MD   100 mg at 09/05/11 0758  . DISCONTD: cloNIDine (CATAPRES) tablet 0.2 mg  0.2 mg Oral TID Mickeal Skinner, MD      . DISCONTD: escitalopram (LEXAPRO) tablet 20 mg  20 mg Oral Daily Mickeal Skinner, MD   20 mg at 09/05/11 0758   Facility-Administered Medications Ordered in Other Encounters  Medication Dose Route Frequency Provider Last Rate Last Dose  . DISCONTD: acetaminophen (TYLENOL) tablet 650 mg  650 mg Oral Q4H PRN Dione Booze, MD      . DISCONTD: alum & mag hydroxide-simeth (MAALOX/MYLANTA) 200-200-20 MG/5ML suspension 30 mL  30 mL Oral PRN Dione Booze, MD      . DISCONTD: benztropine (COGENTIN) tablet 1 mg  1 mg Oral BID Cheri Guppy, MD   1 mg at 09/04/11 0936  . DISCONTD: cloNIDine (CATAPRES) tablet 0.2 mg  0.2 mg Oral BID Dione Booze, MD   0.2 mg at 09/04/11 0936  . DISCONTD: divalproex (DEPAKOTE) DR tablet 500 mg  500 mg Oral Q12H Cheri Guppy,  MD   500 mg at 09/04/11 0936  . DISCONTD: gabapentin (NEURONTIN) capsule 300 mg  300 mg Oral TID Dione Booze, MD   300 mg at 09/04/11 1551  . DISCONTD: haloperidol (HALDOL) tablet 2 mg  2 mg Oral BID Cheri Guppy, MD   2 mg at 09/04/11 0936  . DISCONTD: ibuprofen (ADVIL,MOTRIN) tablet 600 mg  600 mg Oral Q8H PRN Dione Booze, MD      . DISCONTD: LORazepam (ATIVAN) tablet 1 mg  1 mg Oral Q8H PRN Dione Booze, MD      . DISCONTD: nicotine (NICODERM CQ - dosed in mg/24 hr) patch 7 mg  7 mg Transdermal Daily Dione Booze, MD   7 mg at 09/04/11 0940  . DISCONTD: ondansetron (ZOFRAN) tablet 4 mg  4 mg Oral Q8H PRN Dione Booze, MD      . DISCONTD: pantoprazole (PROTONIX) EC tablet 40 mg  40 mg Oral Q0600 Dione Booze, MD   40 mg at 09/04/11 0602  . DISCONTD: zolpidem (AMBIEN) tablet 5 mg  5 mg Oral QHS PRN Dione Booze, MD   5 mg at 09/03/11 2151    Observation Level/Precautions:  routine  Laboratory:    Psychotherapy:    Medications:    Routine PRN Medications:  Yes  Consultations:    Discharge Concerns:    Other:      Lloyd Huger T. Malaijah Houchen PAC for  Dr. Lupe Carney  6/19/20132:23 PM
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
## 2011-09-05 NOTE — Discharge Planning (Signed)
New patient attended AM group; good participation.  States he is here due to running out of meds and hearing voices.  Admits to drinking and drugging to deal with the voices.  Was living in [facility] for 2 years.  Recently relocated here.  Prior to that was in prison for 16 years.  Wants to go to rehab from here.

## 2011-09-05 NOTE — Progress Notes (Signed)
Pt. Has flat affect, depressed mood.  Pt. Attended and participated  In evening group.  Encouragement and support given.  Pt. Receptive.

## 2011-09-05 NOTE — Progress Notes (Signed)
D: Pt has passive SI but agrees to contract for safety; pt has blunted affect and a depressed mood; pt is appropriate and cooperative with staff and has minimal complaints; he is quiet but approachable; pt also can't read or write and needs help each day to complete any forms he may need to fill out A: Pt given emotional support from staff; pt is encouraged to come to staff with questions/concerns; pt's medication routine continued R: Pt remains appropriate and cooperative; will continue to monitor for safety

## 2011-09-05 NOTE — [facility] Suicide Risk Assessment (Signed)
Suicide Risk Assessment  Admission Assessment      Demographic factors:  See chart.  Current Mental Status:  Current Mental Status:  (Currently denies SI); SI prior to admission w/ plan "to use a deer knife"; Hx SIB  Patient seen and evaluated. Chart reviewed. Patient stated that his mood was "not good". His affect was mood congruent and irritable. He denied any current thoughts of self injurious behavior, suicidal ideation or homicidal ideation. There were no auditory or visual hallucinations, paranoia, delusional thought processes, or mania noted.  Thought process was linear and goal directed.  No psychomotor agitation or retardation was noted. His speech was normal rate, tone and volume. Eye contact was good. Judgment and insight are fair.  Patient has been up and engaged on the unit.  No acute safety concerns reported from team.  Loss Factors: Financial problems / change in socioeconomic status  Historical Factors: Prior suicide attempts;Family history of suicide;Family history of mental illness or substance abuse; off meds for past 3 wks - Haldol, Depakote, Neurontin, Cogentin, Clonidine & Celexa  Risk Reduction Factors: Positive coping skills or problem solving skills;Sense of responsibility to family; was seeing psychiatrist at [facility]; sisters in [place]  CLINICAL FACTORS: Alcohol Dependence; Cocaine Abuse; Cannabis Dependence; BPAD vs. Schizoaffective Disorder, per Hx;  Seizure Disorder NOS; HTN  COGNITIVE FEATURES THAT CONTRIBUTE TO RISK: limited insight.  SUICIDE RISK: Pt viewed as a chronic increased risk of harm to self in light of his past hx and risk factors.  No acute safety concerns on the unit.  Pt contracting for safety and in need of crisis stabilization & Tx.  PLAN OF CARE: Pt admitted for crisis stabilization and treatment.  Please see orders.  Restarted all outpt meds minus Haldol, which pt wants to start later 2/2 current sedation.  Last drink 4 days ago, pt declined  need for librium taper.  Medications reviewed with pt and medication education provided. Will continue q15 minute checks per unit protocol.  No clinical indication for one on one level of observation at this time.  Pt contracting for safety.  Mental health treatment, medication management and continued sobriety will mitigate against the increased risk of harm to self and/or others.  Discussed the importance of recovery with pt, as well as, tools to move forward in a healthy & safe manner.  Pt agreeable with the plan.  Discussed with the team. Pt interested in going to an [facility] in [place] s/p discharge.  Lupe Carney 09/05/2011, 9:29 AM

## 2011-09-05 NOTE — Progress Notes (Addendum)
D: Patient arrived on the unit tonight.  Patient appears depressed but pleasant and cooperative.  Patient states he is tired and wants to sleep tonight.   Patient denies SI/HI and denies AVH but contracts for safety if they occur. Patient states he just wants to get help and he has been off of his meds for about 3 weeks.  Patient also stated that he fell prior to admission and has had some soreness in his abdomen from the fall.  A: Patient offered encouragement and support.   R: Patient in room resting no apparent distress. Patient contracts for safety  Staff to monitor Q15 min for safety.

## 2011-09-05 NOTE — Progress Notes (Signed)
[facility] Group Notes:  (Counselor/Nursing/MHT/Case Management/Adjunct)  09/05/2011 8:21 PM  Type of Therapy:  Group Therapy at 11  Participation Level:  Minimal  Participation Quality:  Hesitant  Affect:  Depressed and Irritable  Cognitive:  Oriented  Insight:  Limited  Engagement in Group:  Limited  Engagement in Therapy:  Limited  Modes of Intervention:  Clarification, Socialization and Support  Summary of Progress/Problems: Patient was hesitant to share during group discussion on emotional regulation. The one comment patient did share was "seems like nobody wants to help because of my past." Patient was encouraged by others in group to focus on future and ask, tell us what he needs.  Patient's response was to withdraw more.    [name] 09/05/2011, 8:21 PM

## 2011-09-06 DIAGNOSIS — F1220 Cannabis dependence, uncomplicated: Secondary | ICD-10-CM

## 2011-09-06 DIAGNOSIS — F1420 Cocaine dependence, uncomplicated: Secondary | ICD-10-CM

## 2011-09-06 MED ORDER — HALOPERIDOL 2 MG PO TABS
2.0000 mg | ORAL_TABLET | Freq: Every day | ORAL | Status: DC
  Administered 2011-09-06 – 2011-09-09 (×4): 2 mg via ORAL
  Filled 2011-09-06: qty 2
  Filled 2011-09-06 (×5): qty 1

## 2011-09-06 NOTE — Progress Notes (Signed)
[facility] Adult Inpatient Family/Significant Other Suicide Prevention Education  Suicide Prevention Education:  Contact Attempts: [name], patient's sister,  at [phone] has been identified by the patient as the family member who will aid the patient in the event of a mental health crisis.  With written consent from the patient, two attempts were made to provide suicide prevention education, prior to and/or following the patient's discharge.  We were unsuccessful in providing suicide prevention education.  A suicide education pamphlet was given to the patient to share with family/significant other.  Date and time of first attempt: 09/06/2011 /2:55 PM  Message left as voice mail was identified as [name] Date and time of second attempt: 09/07/2011 at 11AM and 12:33 PM   [name] 09/06/2011, 12:34 PM
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
## 2011-09-06 NOTE — Progress Notes (Signed)
Patient ID: Adrian Alexander, male   DOB: [birth year], 51 y.o.   MRN: [id number] D: Pt. In dayroom watching TV, pt. Reports AH, but does not elaborate, but does appear preoccupied. Pt. Says day has been "messed up, got things to take care of so I can get clean." Pt. Expresses interest in 90 day tx. Pt. Reports he is motivated this time, "I'm almost 52". Pt. Says he has supportive family and a son 22 who lives in the city. A: Writer encouraged pt. To move forward as he has already noted life of drinking and drug have been counterproductive. Writer reviewed new med (haldol) and S.E. R: Writer will monitor for S.E. Of new meds. Staff will monitor q52min for safety.

## 2011-09-06 NOTE — Discharge Planning (Signed)
Adrian Alexander attended AM group.  C/O stomach pain; said the nurse was checking it out for him.  No other complaints.  Yesterday, he and I called his ex-wife in [place] who supports him getting into rehab from here.  Adrian Alexander filled out an application for [facility] yesterday, and we FAXed that in.  I also found a rehab called [facility] in [place] that he can go to.  It is a year long program.  Ex stated she would pay for a train ticket to return to [facility] if he needs help with that.

## 2011-09-06 NOTE — Progress Notes (Signed)
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 Schizoaffective Disorder, per Hx; Seizure Disorder NOS; HTN  The patient was seen today and reports the following:  Sleep: "good" Appetite: "improving"  Mild>(1-10) >Severe  Hopelessness (1-10): Depression (1-10):  Anxiety (1-10):  Suicidal Ideation: Denies suicidal ideation. Plan: None Intent: None Means:  None Homicidal Ideation: Denies homicidal ideation. Plan: None Intent: None Means: None  Eye Contact: Good.  General Appearance/Behavior: cooperative and pleasant Motor Behavior:  normal Speech: clear and coherant  Mental Status: oriented x 3 Level of Consciousness:  alert Mood: mildly depressed Affect: congruent  Thought Process: linear Thought Content: voices 20% reduction in auditory hallucinations. Perception: intact  Judgment: improving Insight: fair Cognition: at least average  Sleep: Number of Hours:      height is 6\' 2"  (1.88 m) and weight is 94.802 kg (209 lb). His oral temperature is 98 F (36.7 C). His blood pressure is 104/68 and his pulse is 54. His respiration is 18.    . benztropine  0.5 mg Oral BID  . citalopram  20 mg Oral Daily  . cloNIDine  0.2 mg Oral BID  . divalproex  250 mg Oral TID PC  . gabapentin  300 mg Oral TID  . multivitamin with minerals  1 tablet Oral Daily  . nicotine  14 mg Transdermal Q0600  . thiamine  100 mg Oral Daily    Results for orders placed during the hospital encounter of 09/04/11 (from the past 48 hour(s))  TSH     Status: Normal   Collection Time   09/05/11  6:10 AM      Component Value Range Comment   TSH 2.065  0.350 - 4.500 uIU/mL   VALPROIC ACID LEVEL     Status: Abnormal   Collection Time   09/05/11  6:10 AM      Component Value Range Comment   Valproic Acid Lvl 40.1 (*) 50.0 - 100.0 ug/mL     No results found for this or any previous visit (from the past 48 hour(s)).  ROS:    Constitutional: WDWN AAF NAD   GI: Negative for N,V,D,C   Neuro: Negative for dizziness, blurred vision, visual changes, headaches   Resp: Negative for wheezing, SOB, cough   Cardio: Negative for CP, diaphoresis, fatigue   MSK: Negative for joint pain, swelling, DROM, or ambulatory difficulties. Time was spent with the patient discussing his symptoms. He is currently considering his options for further care upon discharge including going into a treatment program and returning to [place].  Treatment Plan Summary:  1. Restart Haldol to reduce the auditory hallucinations per the patient's request. 2. Continue the current plan of care. 3. Follow as planned with anticipated discharge over the weekend if no complications.  Rona Ravens. [facility] 09/06/2011

## 2011-09-06 NOTE — Progress Notes (Signed)
D) reports sleep as fair, appetite improving, energy level low and ability to pay attention improving. Rates depression 7/10 and hopelessness 6/10. Withdrawal symptoms noted were sedation and chilling and mild tremors. Also states that he has experienced lightheadedness and dizziness.  A) Patient encouraged to go to group and talk about addiction. Encouragement and support given. R) Patient receptive to staff encouragement and instruction.

## 2011-09-06 NOTE — Treatment Plan (Signed)
Interdisciplinary Treatment Plan Update (Adult)  Date: 09/06/2011  Time Reviewed: 11:16 AM   Progress in Treatment: Attending groups: Yes Participating in groups: Yes Taking medication as prescribed: Yes Tolerating medication: Yes   Family/Significant other contact made:  Yes Patient understands diagnosis:  Yes  As evidenced by asking for help with getting back on meds and withdrawal symptoms from alcohol Discussing patient identified problems/goals with staff:  Yes  See below Medical problems stabilized or resolved:  Yes Denies suicidal/homicidal ideation: Yes  In AM group Issues/concerns per patient self-inventory:  Yes  Depression 7, hopelessness 6  Lightheadedness and dizziness Other:  New problem(s) identified: N/A  Reason for Continuation of Hospitalization: Depression Medication stabilization  Interventions implemented related to continuation of hospitalization: Continue current medications  Encourage group attendance and participation  Additional comments:  Estimated length of stay: 1-2 days  Discharge Plan: Rehab  New goal(s): N/A  Review of initial/current patient goals per problem list:   1.  Goal(s):Safely detox from alcohol  Met:  Yes  Target date:6/20  As evidenced NW:[id number] vitals are stable, and he has minimal withdrawal symptoms  PRN librium to be d/ced tomorrow  2.  Goal (s): Stabilize mood with help of medication and therapeutic milieu  Met:  No  Target date 6/21:  As evidenced QM:VHQI report of depression at a 4 or less  3.  Goal(s):Get into rehab from here  Met:  No  Target date:6/21  As evidenced by: confirmation of bed.  He is hoping to get into [facility], or that ex-wife will find a place in [place]  4.  Goal(s):  Met:  No  Target date:  As evidenced by:  Attendees: Patient:     Family:     Physician:  Lupe Carney 09/06/2011 11:16 AM   Nursing:    09/06/2011 11:16 AM   Case Manager:  Richelle Ito, LCSW 09/06/2011  11:16 AM   Counselor:  Ronda Fairly, LCSWA 09/06/2011 11:16 AM   Other:     Other:     Other:     Other:      Scribe for Treatment Team:   Ida Rogue, 09/06/2011 11:16 AM

## 2011-09-06 NOTE — Progress Notes (Signed)
09/06/2011         Time: 1415      Group Topic/Focus: The focus of this group is on discussing various styles of communication and communicating assertively using 'I' (feeling) statements.   Participation Level: Minimal  Participation Quality: Drowsy  Affect: Appropriate  Cognitive: Oriented  Additional Comments: Patient well-engaged while awake, but had trouble staying awake throughout group and fell asleep several times.   Arya Luttrull 09/06/2011 3:46 PM

## 2011-09-06 NOTE — Progress Notes (Signed)
Patient ID: Adrian Alexander, male   DOB: 1961-03-01, 51 y.o.   MRN:  D: Pt. In bed, eyes closed A:  Resp. Even no distress noted. R: Staff will monitor q9min for safety.

## 2011-09-07 NOTE — Progress Notes (Signed)
(  D) Patient presents with flat, sad affect. Reports that the voices come and go and are not as intrusive in the AM. Patient interacts minimally with peers. Eye contact brief. (A) Patient encouraged to review coping skills, attend all groups and let staff know if voices become overwhelming. (R) Patient receptive to encouragement. Joice Lofts RN MS EdS 09/07/2011  9:23 AM

## 2011-09-07 NOTE — Progress Notes (Signed)
(  D)Pt has been flat in affect, depressed in mood. Pt somewhat guarded and speaks quietly. Pt reported feeling anxious and shaky. Pt shared that he could "Feel myself detoxing." (A)PRN Librium given. Support and encouragement given. (R)Pt receptive. Pt went to sleep after eating dinner and has not had any other complaints.

## 2011-09-07 NOTE — Discharge Planning (Signed)
Lynwood reports passive SI in AM group.  Presents as sad, despondent, hopeless.  "I cannot say I would be safe discharging this weekend."  Wants a confirmed bed at rehab from here, and the trip to [place] to get into the [facility] apparently does not fill the bill.  No openings at [facility] today.  [facility] had a bed, but no doctor available for suicide assessment.  Will try again Monday.

## 2011-09-07 NOTE — Progress Notes (Signed)
[facility] Group Notes:  (Counselor/Nursing/MHT/Case Management/Adjunct)  09/07/2011 12:38 PM  Type of Therapy:  Group Therapy at 11:00  Participation Level:  Active  Participation Quality:  Attentive and Sharing  Affect:  Appropriate  Cognitive:  Appropriate  Insight:  Good  Engagement in Group:  Good  Engagement in Therapy:  Good  Modes of Intervention:  Education and Support  Summary of Progress/Problems: Lynwood was attentive to discussion on Post Acute Withdrawal Syndrome (PAWS). He shared that "yes our families may benefit from this information but it is more important how we deliver it  My sister see me walking by on the street she see my attitude but if I man up and sit down in front of her and speak my truth she can then and only then hear me. Communication is hard and if I don't learn how to start it will get harder."    [facility] Group Notes:  (Counselor/Nursing/MHT/Case Management/Adjunct)  09/07/2011 3:57 PM  Type of Therapy:  Group Therapy at 1:15  Participation Level:  Active  Participation Quality:  Appropriate, Attentive and Sharing  Affect:  Appropriate  Cognitive:  Appropriate  Insight:  Good  Engagement in Group:  Good  Engagement in Therapy:  Good  Modes of Intervention:  Clarification, Problem-solving, Socialization and Support  Summary of Progress/Problems:  Lynwood participated in discussion on vulnerability and was large part of group decision making to discuss vulnerability vs doing a relaxation activity.  Lynwood advocated for "discussion that can help [place] learn something."  Patient shared how it is important to learn to trust someone verses keeping things bottled up and learning to accept information and suggestions that will help [place].  He recalled his experience of taking family for granted and learning lesson of how much they meant to him when separated from supports while incarcerated.  "Having a desire and a willingness to improve my life is basically all  I have to go on now."    [name] 09/07/2011, 4:02 PM

## 2011-09-08 DIAGNOSIS — F1020 Alcohol dependence, uncomplicated: Secondary | ICD-10-CM

## 2011-09-08 MED ORDER — HYDROXYZINE HCL 25 MG PO TABS
25.0000 mg | ORAL_TABLET | Freq: Four times a day (QID) | ORAL | Status: DC | PRN
  Administered 2011-09-08 – 2011-09-09 (×2): 25 mg via ORAL

## 2011-09-08 NOTE — Progress Notes (Signed)
Patient ID: Adrian Alexander, male   DOB: [birth year], 51 y.o.   MRN: [id number]  Pt. Seen and evaluated.  It is uncertain at this time whether he is going to be discharged today to go home or to [facility].  The CM is unsure if there is a bed available, and clinical staff has not been given enough notice to prepare for immediate discharge should that be the case.  Adrian Alexander has also expressed interest in returning to the [place] area upon discharge as well.  He is alert and oriented, speech is clear, and he is in full touch with reality.  He denies AH/VH and reports no SI/HI.  He is actively contacting shelters and treatment programs on the phone.  He has been up and active in the unit milieu today and participating in group conversations.  He notes no symptoms of withdrawal and has no new complaints.  He is eating and sleeping well. ROS: negative.  Temp:  [98 F (36.7 C)-98.6 F (37 C)] 98 F (36.7 C) (06/22 0700) Pulse Rate:  [52-63] 52  (06/22 0700) Resp:  [16-18] 16  (06/22 0700) BP: (104-130)/(70-84) 104/70 mmHg (06/22 0705)     . benztropine  0.5 mg Oral BID  . citalopram  20 mg Oral Daily  . cloNIDine  0.2 mg Oral BID  . divalproex  250 mg Oral TID PC  . gabapentin  300 mg Oral TID  . haloperidol  2 mg Oral QHS  . multivitamin with minerals  1 tablet Oral Daily  . nicotine  14 mg Transdermal Q0600  . thiamine  100 mg Oral Daily   A. Alcohol dependence P) Will wait to hear from case manager regarding discharge plans      Continue current medications with out changes.   Rona Ravens. Mirabella Hilario PAC

## 2011-09-08 NOTE — Progress Notes (Signed)
Patient ID: Adrian Alexander, male   DOB: 25-Nov-1960, 51 y.o.   MRN: [id number] D: Pt. Lying in bed, resting, no concerns at this time. A: Writer monitored for signs of detox. Encouraged client to verbalize any concerns. R: Staff will monitor for safety q31min.

## 2011-09-08 NOTE — Progress Notes (Signed)
Patient ID: Adrian Alexander, male   DOB: 12/24/1960, 51 y.o.   MRN: [id number]  Pt. attended and participated in aftercare planning group. Pt. accepted information on suicide prevention, warning signs to look for with suicide and crisis line numbers to use. Pt. listed their current anxiety level as 8 and their current depression level as 8.

## 2011-09-08 NOTE — Progress Notes (Signed)
Patient ID: Adrian Alexander, male   DOB: 08/26/1960, 51 y.o.   MRN: [id number]  [facility] Group Notes:  (Counselor/Nursing/MHT/Case Management/Adjunct)  09/08/2011 1:15 PM  Type of Therapy:  Group Therapy, Dance/Movement Therapy   Participation Level:  Active  Participation Quality:  Appropriate  Affect:  Appropriate  Cognitive:  Appropriate  Insight:  Limited  Engagement in Group:  Good  Engagement in Therapy:  Good  Modes of Intervention:  Clarification, Problem-solving, Role-play, Socialization and Support  Summary of Progress/Problems:  Therapist discussed the term self-sabotage and enabling.  Therapist asked group to discuss the role self-sabotage plays in their lives.  Therapist asked group to describe how enabler's can sabotage the recovery process.  Therapist asked pt. what do we do to sabotage ourselves and why.  Therapist asked pt. to name one healthy coping mechanism to apply when wanting to self sabotage.  Pt. stated "Self sabotage means thinking negative and setting yourself up for failure. Pt. stated "one healthy coping mechanism is taking medication on a regular basis.    Rhunette Croft

## 2011-09-08 NOTE — Progress Notes (Signed)
[facility] MD Progress Note  09/08/2011 11:11 AM  Diagnosis:   Axis I: Bipolar, Depressed, Substance Abuse and Substance Induced Mood Disorder Axis II: Deferred Axis III:  Past Medical History  Diagnosis Date  . Bipolar affective disorder, depressed   . Schizophrenia, schizo-affective   . Hypertension   . Anxiety   . Seizures     Last month. gets a sensation    Subjective: Adrian Alexander presents today complaining of feeling "jittery." When asked to explain what that meant, he stated that his stomach was feeling odd. He denies any cravings for alcohol cocaine or marijuana, but reports that when he woke this morning he felt that he could taste cocaine. He slept somewhat better last night, and was able to eat breakfast this morning. He denies any suicidal or homicidal ideation today. He denies any visual hallucinations, but does endorse some auditory hallucinations of voices he is unable to understand. He does feel that those are diminishing. He expresses a desire to attend a residential long-term treatment facility after discharge.  ADL's:  Intact  Sleep: Good  Appetite:  Good  Suicidal Ideation:  Patient denies thought, plan, or intent Homicidal Ideation:  Patient denies thought, plan, or intent  AEB (as evidenced by):  Mental Status Examination/Evaluation: Objective:  Appearance: Disheveled  Eye Contact::  Fair  Speech:  Clear and Coherent  Volume:  Decreased  Mood:  Depressed  Affect:  Congruent  Thought Process:  Linear  Orientation:  Full  Thought Content:  Hallucinations: Auditory  Suicidal Thoughts:  No  Homicidal Thoughts:  No  Memory:  Immediate;   Good Recent;   Good Remote;   Good  Judgement:  Fair  Insight:  Fair  Psychomotor Activity:  Psychomotor Retardation  Concentration:  Good  Recall:  Good  Akathisia:  No  Handed:    AIMS (if indicated):     Assets:  Desire for Improvement  Sleep:  Number of Hours: 5.5    Vital Signs:Blood pressure 104/70, pulse 52,  temperature 98 F (36.7 C), temperature source Oral, resp. rate 16, height 6\' 2"  (1.88 m), weight 94.802 kg (209 lb). Current Medications: Current Facility-Administered Medications  Medication Dose Route Frequency Provider Last Rate Last Dose  . acetaminophen (TYLENOL) tablet 650 mg  650 mg Oral Q6H PRN Mickeal Skinner, MD   650 mg at 09/05/11 1702  . alum & mag hydroxide-simeth (MAALOX/MYLANTA) 200-200-20 MG/5ML suspension 30 mL  30 mL Oral Q4H PRN Mickeal Skinner, MD      . benztropine (COGENTIN) tablet 0.5 mg  0.5 mg Oral BID Mickeal Skinner, MD   0.5 mg at 09/08/11 0759  . chlordiazePOXIDE (LIBRIUM) capsule 25 mg  25 mg Oral Q6H PRN Mickeal Skinner, MD   25 mg at 09/07/11 1706  . citalopram (CELEXA) tablet 20 mg  20 mg Oral Daily Alyson Kuroski-Mazzei, DO   20 mg at 09/08/11 0756  . cloNIDine (CATAPRES) tablet 0.2 mg  0.2 mg Oral BID Mickeal Skinner, MD   0.2 mg at 09/08/11 0756  . divalproex (DEPAKOTE) DR tablet 250 mg  250 mg Oral TID PC Mickeal Skinner, MD   250 mg at 09/08/11 0756  . gabapentin (NEURONTIN) capsule 300 mg  300 mg Oral TID Mickeal Skinner, MD   300 mg at 09/08/11 0756  . haloperidol (HALDOL) tablet 2 mg  2 mg Oral QHS Verne Spurr, PA-C   2 mg at 09/07/11 2208  . hydrOXYzine (ATARAX/VISTARIL) tablet 25 mg  25 mg Oral Q6H PRN Mickeal Skinner, MD  25 mg at 09/06/11 2146  . loperamide (IMODIUM) capsule 2-4 mg  2-4 mg Oral PRN Mickeal Skinner, MD      . magnesium hydroxide (MILK OF MAGNESIA) suspension 30 mL  30 mL Oral Daily PRN Mickeal Skinner, MD      . multivitamin with minerals tablet 1 tablet  1 tablet Oral Daily Mickeal Skinner, MD      . nicotine (NICODERM CQ - dosed in mg/24 hours) patch 14 mg  14 mg Transdermal Q0600 Mickeal Skinner, MD   14 mg at 09/08/11 1610  . ondansetron (ZOFRAN-ODT) disintegrating tablet 4 mg  4 mg Oral Q6H PRN Mickeal Skinner, MD      . thiamine (VITAMIN B-1) tablet 100 mg  100 mg Oral Daily Mickeal Skinner, MD   100 mg at 09/08/11 0756    Lab Results:  No results found for this or any previous visit (from the past 48 hour(s)).  Physical Findings: AIMS:  , ,  ,  ,    CIWA:  CIWA-Ar Total: 0  COWS:     Treatment Plan Summary: Daily contact with patient to assess and evaluate symptoms and progress in treatment Medication management  Plan: We will continue his current plan of care, and monitor him closely. We'll continue to research possible facilities to attend after discharge.  Kenyada Dosch 09/08/2011, 11:11 AM

## 2011-09-08 NOTE — Progress Notes (Signed)
D slept poorly last nite, appetite is improving, energy level is low and ability to pay attention is improving, depressed 81/0 and hopeless 8/10, denies Si or Hi, wants to take better care of himself and go to treatment, eats in the DR and takes his meds as ordered by MD, attending group. A q23min safety checks continue and support offered R safety maintained

## 2011-09-08 NOTE — Progress Notes (Signed)
[facility] Family/Significant Other Suicide Prevention Education  Suicide Prevention Education:  Education Completed; [name], patient's sister, at [phone]  has been identified by the patient as the family member/significant other with whom the patient will be residing, and identified as the person(s) who will aid the patient in the event of a mental health crisis (suicidal ideations/suicide attempt).  With written consent from the patient, the family member/significant other has been provided the following suicide prevention education, prior to the and/or following the discharge of the patient.  The suicide prevention education provided includes the following:  Suicide risk factors  Suicide prevention and interventions  National Suicide Hotline telephone number  [facility] assessment telephone number  [facility] Emergency Assistance 911  [facility] and/or Residential Mobile Crisis Unit telephone number  Request made of family/significant other to:  Remove weapons (e.g., guns, rifles, knives), all items previously/currently identified as safety concern.    Remove drugs/medications (over-the-counter, prescriptions, illicit drugs), all items previously/currently identified as a safety concern.  The family member/significant other verbalizes understanding of the suicide prevention education information provided.  The family member/significant other agrees to remove the items of safety concern listed above.  Sister would like to see pt in a treatment center and reports that his lack of housing is his main trigger to use, for he has many friends that "get high". Sister states that if the pt "get around the right people" he will take his medications. Sister would like to the pt go back to [facility] for that is where his sibling are and he has more supports. [facility] 09/08/2011, 10:17 AM

## 2011-09-08 NOTE — Progress Notes (Signed)
Pt pleasant on approach.  States that he needs anxiety medication, his librium protocol was discontinued today and with it his vistaril.  Pt wanted to see if it was possible to get this back.  No other complaints voiced, positive for evening AA group.  Interacting appropriately on unit, denies SI/HI/hallucinations at this time.  Doctor on call notified of Pt's request for anxiety medication, orders received.  Support and encouragement offered, Pt pleased at new orders, will continue to monitor.

## 2011-09-09 MED ORDER — TRAZODONE HCL 50 MG PO TABS
50.0000 mg | ORAL_TABLET | Freq: Every day | ORAL | Status: DC
  Administered 2011-09-09: 50 mg via ORAL
  Filled 2011-09-09 (×2): qty 1

## 2011-09-09 MED ORDER — BENZOCAINE 10 % MT GEL
Freq: Four times a day (QID) | OROMUCOSAL | Status: DC | PRN
  Filled 2011-09-09: qty 9.4

## 2011-09-09 MED ORDER — IBUPROFEN 200 MG PO TABS
400.0000 mg | ORAL_TABLET | Freq: Four times a day (QID) | ORAL | Status: DC | PRN
  Administered 2011-09-09: 400 mg via ORAL
  Filled 2011-09-09: qty 2

## 2011-09-09 NOTE — Progress Notes (Signed)
Patient ID: Adrian Alexander, male   DOB: 1960/08/16, 51 y.o.   MRN: [id number] Pt. attended and participated in aftercare planning group. Pt. accepted information on suicide prevention, warning signs to look for with suicide and crisis line numbers to use. The pt. agreed to call crisis line numbers if having warning signs or having thoughts of suicide. Pt. listed their current anxiety level as 6 and depression level as 6 on a scale of 1 to 10 with 10  being the high.

## 2011-09-09 NOTE — Progress Notes (Signed)
D.  Pt pleasant on approach, does report left upper tooth pain.  Pt positive for group this evening, currently denying SI/HI/hallucinations though he did report some audio and visual hallucinations earlier today.  A.  Doctor notified of tooth pain, orders received.  Support and encouragement provided. R.  Pt remains safe on unit, in dayroom eating a snack.

## 2011-09-09 NOTE — Progress Notes (Signed)
D: Pt has been up and has been active and visible in milieu today, pt has expressed he has been feeling anxious and having auditory hallucinations, pt has received all medications today without incident and has been participating in various milieu activities. A: Pt provided with support and encouragement. R: Safety maintained

## 2011-09-09 NOTE — Progress Notes (Signed)
Patient ID: Ariston Grandison, male   DOB: 11-11-1960, 51 y.o.   MRN: [id number]  [facility] Group Notes:  (Counselor/Nursing/MHT/Case Management/Adjunct)  09/09/2011 1:15 PM  Type of Therapy:  Group Therapy, Dance/Movement Therapy   Participation Level:  Active  Participation Quality:  Appropriate  Affect:  Appropriate  Cognitive:  Appropriate  Insight:  Limited  Engagement in Group:  Good  Engagement in Therapy:  Good  Modes of Intervention:  Clarification, Problem-solving, Role-play, Socialization and Support  Summary of Progress/Problems:  Therapist read the poem "I Am Your Disease" and asked group how does this poem apply to their lives.  Therapist asked group what are ways to support yourselves when you feeling as if you have no where or no one to turn to.  Pt. stated that "This poem is very powerful and I see myself in this poem.  If I have can keep going back to AA meetings for support".   Rhunette Croft

## 2011-09-09 NOTE — Progress Notes (Signed)
[facility] MD Progress Note  09/09/2011 9:26 AM  Diagnosis:   Axis I: Alcohol Abuse, Bipolar, Depressed, Substance Abuse and Substance Induced Mood Disorder Axis II: Deferred Axis III:  Past Medical History  Diagnosis Date  . Bipolar affective disorder, depressed   . Schizophrenia, schizo-affective   . Hypertension   . Anxiety   . Seizures     Last month. gets a sensation   Subjective: Adrian Alexander reports that he feels jittery today. He states that he ran out of his medication that helps him to not feel jittery. He also continues to complain about and on feeling in his stomach. He endorses cravings for cocaine. He also endorses auditory and visual hallucinations, but declines an offer to increase his Haldol. He reports that he fell asleep okay last night, but then woke early this morning. He tends to have increased anxiety when he is alone, so he tries to stay around people as much as possible. He denies any suicidal or homicidal ideation. He expresses a desire to go to [facility] for 14 days, then transition to a long-term treatment facility for substance abuse.  ADL's:  Intact  Sleep: Fair  Appetite:  Good  Suicidal Ideation:  Patient denies thought, plan, intent Homicidal Ideation:  Patient denies thought, plan, or intent  AEB (as evidenced by):  Mental Status Examination/Evaluation: Objective:  Appearance: Disheveled  Eye Contact::  Minimal  Speech:  Clear and Coherent and Slow  Volume:  Decreased  Mood:  Depressed  Affect:  Congruent  Thought Process:  Goal Directed and Logical  Orientation:  Full  Thought Content:  Hallucinations: Auditory Visual  Suicidal Thoughts:  No  Homicidal Thoughts:  No  Memory:  Immediate;   Good Recent;   Good Remote;   Good  Judgement:  Fair  Insight:  Fair  Psychomotor Activity:  Psychomotor Retardation  Concentration:  Good  Recall:  Good  Akathisia:  No  Handed:    AIMS (if indicated):     Assets:  Desire for Improvement Resilience  Sleep:   Number of Hours: 5.75    Vital Signs:Blood pressure 120/75, pulse 59, temperature 96.8 F (36 C), temperature source Oral, resp. rate 16, height 6\' 2"  (1.88 m), weight 94.802 kg (209 lb). Current Medications: Current Facility-Administered Medications  Medication Dose Route Frequency Provider Last Rate Last Dose  . acetaminophen (TYLENOL) tablet 650 mg  650 mg Oral Q6H PRN Mickeal Skinner, MD   650 mg at 09/05/11 1702  . alum & mag hydroxide-simeth (MAALOX/MYLANTA) 200-200-20 MG/5ML suspension 30 mL  30 mL Oral Q4H PRN Mickeal Skinner, MD      . benztropine (COGENTIN) tablet 0.5 mg  0.5 mg Oral BID Mickeal Skinner, MD   0.5 mg at 09/09/11 0830  . citalopram (CELEXA) tablet 20 mg  20 mg Oral Daily Alyson Kuroski-Mazzei, DO   20 mg at 09/09/11 0830  . cloNIDine (CATAPRES) tablet 0.2 mg  0.2 mg Oral BID Mickeal Skinner, MD   0.2 mg at 09/09/11 0830  . divalproex (DEPAKOTE) DR tablet 250 mg  250 mg Oral TID PC Mickeal Skinner, MD   250 mg at 09/09/11 0830  . gabapentin (NEURONTIN) capsule 300 mg  300 mg Oral TID Mickeal Skinner, MD   300 mg at 09/09/11 0830  . haloperidol (HALDOL) tablet 2 mg  2 mg Oral QHS Verne Spurr, PA-C   2 mg at 09/08/11 2135  . hydrOXYzine (ATARAX/VISTARIL) tablet 25 mg  25 mg Oral Q6H PRN Nehemiah Settle, MD  25 mg at 09/08/11 2207  . magnesium hydroxide (MILK OF MAGNESIA) suspension 30 mL  30 mL Oral Daily PRN Mickeal Skinner, MD      . multivitamin with minerals tablet 1 tablet  1 tablet Oral Daily Mickeal Skinner, MD   1 tablet at 09/09/11 0830  . nicotine (NICODERM CQ - dosed in mg/24 hours) patch 14 mg  14 mg Transdermal Q0600 Mickeal Skinner, MD   14 mg at 09/09/11 0600  . thiamine (VITAMIN B-1) tablet 100 mg  100 mg Oral Daily Mickeal Skinner, MD   100 mg at 09/09/11 0830  . traZODone (DESYREL) tablet 50 mg  50 mg Oral QHS Jorje Guild, PA-C        Lab Results: No results found for this or any previous visit (from the past 48 hour(s)).  Physical Findings: AIMS:   , ,  ,  ,    CIWA:  CIWA-Ar Total: 3  COWS:     Treatment Plan Summary: Daily contact with patient to assess and evaluate symptoms and progress in treatment Medication management  Plan: We will start him on trazodone 50 mg at bedtime. We will continue to research possible treatment facilities for him to discharge to.  Adrian Alexander 09/09/2011, 9:26 AM

## 2011-09-10 DIAGNOSIS — F1994 Other psychoactive substance use, unspecified with psychoactive substance-induced mood disorder: Secondary | ICD-10-CM | POA: Diagnosis present

## 2011-09-10 DIAGNOSIS — F1920 Other psychoactive substance dependence, uncomplicated: Secondary | ICD-10-CM

## 2011-09-10 MED ORDER — HALOPERIDOL 2 MG PO TABS: 2.0000 mg | ORAL_TABLET | Freq: Every day | ORAL | Status: AC

## 2011-09-10 MED ORDER — GABAPENTIN 300 MG PO CAPS: 300.0000 mg | ORAL_CAPSULE | Freq: Three times a day (TID) | ORAL | Status: DC

## 2011-09-10 MED ORDER — DIVALPROEX SODIUM 250 MG PO DR TAB: 250.0000 mg | DELAYED_RELEASE_TABLET | Freq: Three times a day (TID) | ORAL | Status: DC

## 2011-09-10 MED ORDER — TRAZODONE HCL 50 MG PO TABS: 50.0000 mg | ORAL_TABLET | Freq: Every day | ORAL | Status: AC

## 2011-09-10 MED ORDER — CITALOPRAM HYDROBROMIDE 20 MG PO TABS: 20.0000 mg | ORAL_TABLET | Freq: Every day | ORAL | Status: AC

## 2011-09-10 MED ORDER — BENZTROPINE MESYLATE 0.5 MG PO TABS: 0.5000 mg | ORAL_TABLET | Freq: Two times a day (BID) | ORAL | Status: DC

## 2011-09-10 MED ORDER — CLONIDINE HCL 0.2 MG PO TABS: 0.2000 mg | ORAL_TABLET | Freq: Two times a day (BID) | ORAL | Status: DC

## 2011-09-10 NOTE — Discharge Summary (Signed)
Physician Discharge Summary Note  Patient:  Adrian Alexander is an 51 y.o., male .6  MRN:  [id number] DOB:  [birth year] Patient phone:  [phone] (home)  Patient address:   [address] [place] [address],   Date of Admission:  09/04/2011 Date of Discharge:  // Reason for Admission:  Discharge Diagnoses: Principal Problem:  *Alcohol dependence Active Problems:  Mood disorder Discharge Diagnoses:  AXIS I: Polysubstance Dependence including Alcohol, Cocaine and Cannabis.  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  AXIS II: Deferred.  AXIS III: 1. Seizure Disorder.  2. Hypertension.  AXIS IV: Chronic Mental Illness. Substance Abuse Related Illnesses. Unemployment. Corporate treasurer.  AXIS V: GAF at time of admission approximately 40. GAF at time of discharge approximately 50.   Level of Care:  Residential treatment facility  Hospital Course:  Adrian Alexander was admitted for crisis management and stabilization.  He was restarted on Neurontin and clonidine for his anxiety and hypertension respectively.  For his psychosis he was given Haldol for his psychosis as well as depakote for mood stabilization.  He was monitored with daily evaluations with clinical providers, saw case managers, Materials engineer. He was also evaluated with  Daily self assessments to monitor his mental and emotional status.  After evaluation by the treatment team Adrian Alexander voiced a desire to enter into a treatment program and placement was sought at [facility].  His detox was completed and he was able to go to [facility] on the 6th day of his admission when a bed was available.  He noted no withdrawal symptoms and was in full contact with reality.  He was motivated and anxious to move to the next step of recovery.   Consults:  none  Significant Diagnostic Studies:  none  Discharge Vitals:   Blood pressure 118/74, pulse 62, temperature 97 F (36.1 C), temperature source Oral, resp. rate 20,  height 6\' 2"  (1.88 m), weight 94.802 kg (209 lb).  Mental Status Exam: See Mental Status Examination and Suicide Risk Assessment completed by Attending Physician prior to discharge.  Discharge destination:  [facility] Is patient on multiple antipsychotic therapies at discharge:  No   Has Patient had three or more failed trials of antipsychotic monotherapy by history:  No Recommended Plan for Multiple Antipsychotic Therapies: not applicable  Discharge Orders    Future Orders Please Complete By Expires   Diet - low sodium heart healthy      Increase activity slowly      Discharge instructions      Comments:   Take all medications as prescribed.  Keep all follow up appointments to make sure you get your refills in a timely manner.     Medication List  As of 09/10/2011 10:28 AM   TAKE these medications      Indication    benztropine 0.5 MG tablet   Commonly known as: COGENTIN   Take 1 tablet (0.5 mg total) by mouth 2 (two) times daily. For EPS.       citalopram 20 MG tablet   Commonly known as: CELEXA   Take 1 tablet (20 mg total) by mouth daily. For anxiety and depression.    Indication: Social Anxiety Disorder      cloNIDine 0.2 MG tablet   Commonly known as: CATAPRES   Take 1 tablet (0.2 mg total) by mouth 2 (two) times daily. For hypertension.    Indication: High Blood Pressure      divalproex 250 MG DR tablet   Commonly  known as: DEPAKOTE   Take 1 tablet (250 mg total) by mouth 3 (three) times daily. For mood stabilization.       gabapentin 300 MG capsule   Commonly known as: NEURONTIN   Take 1 capsule (300 mg total) by mouth 3 (three) times daily. For anxiety and pain.    Indication: Neurogenic Pain      haloperidol 2 MG tablet   Commonly known as: HALDOL   Take 1 tablet (2 mg total) by mouth at bedtime. For sleep and mental clarity.       traZODone 50 MG tablet   Commonly known as: DESYREL   Take 1 tablet (50 mg total) by mouth at bedtime. For insomnia.    Indication:  Trouble Sleeping           Follow-up Information    Follow up with [facility] on 09/10/2011. (Will pick you up today)    Contact information:   [address] [employer]  [336] [phone]         Follow-up recommendations:  Heart healthy diet, activity as tolerated.  Comments:    Signed: Lloyd Huger T. Chris Narasimhan PAC For Dr. Harvie Heck D. Readling 09/10/2011, 10:28 AM

## 2011-09-10 NOTE — Progress Notes (Signed)
[facility] Case Management Discharge Plan:  Will you be returning to the same living situation after discharge: No. At discharge, do you have transportation home?:Yes,  [facility] Do you have the ability to pay for your medications:Yes,  mental health  Interagency Information:     Release of information consent forms completed and in the chart;  Patient's signature needed at discharge.  Patient to Follow up at:  Follow-up Information    Follow up with [facility] on 09/10/2011. (Will pick you up today)    Contact information:   [address]  [place]  [336] [phone]         Patient denies SI/HI:   Yes,  yes    Safety Planning and Suicide Prevention discussed:  Yes,  yes  Barrier to discharge identified:No.  Summary and Recommendations:   Adrian Alexander 09/10/2011, 9:50 AM

## 2011-09-10 NOTE — [facility] Suicide Risk Assessment (Signed)
Suicide Risk Assessment  Discharge Assessment     Demographic factors:  Male;Divorced or widowed;Low socioeconomic status;Unemployed  Current Mental Status Per Nursing Assessment::   On Admission:   (Currently denies SI) At Discharge:  The patient reports to sleeping well last night and report a good appetite.  He reports mild depressive symptoms as well as moderate anxiety but adamantly denies any suicidal or homicidal ideations or any auditory or visual hallucinations or delusional thinking.  He denies any alcohol withdrawal and reports some mild craving for cocaine.  He states he is ready for discharge today to [facility] for further treatment of his substance abuse issues.  Current Mental Status Per Physician:  Diagnosis:  Axis I: Polysubstance Dependence including Alcohol, Cocaine and Cannabis.            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  The patient was seen today and reports the following:   ADL's: Intact.   Sleep: The patient reports to sleeping well last night.  Appetite: The patient reports a good appetite today.   Mild>(1-10) >Severe  Hopelessness (1-10): 0  Depression (1-10): 3  Anxiety (1-10): 6   Suicidal Ideation: The patient adamantly denies any suicidal ideations today.  Plan: No  Intent: No  Means: No   Homicidal Ideation: The patient adamantly denies any homicidal ideations today.  Plan: No  Intent: No.  Means: No   General Appearance/Behavior: The patient was friendly and cooperative today with this provider.  Eye Contact: Good.  Speech: Appropriate in rate and volume with no pressuring noted today.  Motor Behavior: wnl.  Level of Consciousness: Alert and Oriented x 3.  Mental Status: Alert and Oriented x 3.  Mood: Appears mildly depressed today.  Affect: Appears mildly constricted.  Anxiety Level: Moderate anxiety reported today.  Thought Process: wnl.  Thought Content: The patient denies any auditory or visual hallucinations  today as well as any delusional thinking. Perception: wnl..  JudgmentPeri [place].  Insight: Good.  Cognition: Oriented to person, place and time.   Loss Factors: Financial problems / change in socioeconomic status  Historical Factors: Prior suicide attempts;Family history of suicide;Family history of mental illness or substance abuse  Risk Reduction Factors:   Good insight into illness.  Patient going to a longer term care facility for further treatment of   Continued Clinical Symptoms:  Depression:   Comorbid alcohol abuse/dependence Alcohol/Substance Abuse/Dependencies More than one psychiatric diagnosis Previous Psychiatric Diagnoses and Treatments  Discharge Diagnoses:   AXIS I:   Polysubstance Dependence including Alcohol, Cocaine and Cannabis.              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 AXIS II:   Deferred. AXIS III:   1.  Seizure Disorder.   2.  Hypertension. AXIS IV:   Chronic Mental Illness.  Substance Abuse Related Illnesses.  Unemployment.  Corporate treasurer. AXIS V:   GAF at time of admission approximately 40.  GAF at time of discharge approximately 50.  Cognitive Features That Contribute To Risk:  None Noted.    Review of Systems:  Neurological: The patient denies any headaches today. He denies any seizures or dizziness.  G.I.: The patient denies any constipation or G.I. Upset today.  Musculoskeletal: The patient denies any muscle or skeletal difficulties.   Time was spent today discussing with the patient his current symptoms.  The patient reports to sleeping well last night and report a good appetite.  He reports mild depressive symptoms as well as moderate anxiety but  adamantly denies any suicidal or homicidal ideations or any auditory or visual hallucinations or delusional thinking.  He denies any alcohol withdrawal and reports some mild craving for cocaine.  He states he is ready for discharge today to [facility] for further treatment of his  substance abuse issues.  Current Medications:    . benztropine  0.5 mg Oral BID  . citalopram  20 mg Oral Daily  . cloNIDine  0.2 mg Oral BID  . divalproex  250 mg Oral TID PC  . gabapentin  300 mg Oral TID  . haloperidol  2 mg Oral QHS  . multivitamin with minerals  1 tablet Oral Daily  . nicotine  14 mg Transdermal Q0600  . thiamine  100 mg Oral Daily  . traZODone  50 mg Oral QHS   Treatment Plan Summary:  1. Daily contact with patient to assess and evaluate symptoms and progress in treatment.  2. Medication management  3. The patient will deny suicidal ideations or homicidal ideations for 48 hours prior to discharge and have a depression and anxiety rating of 3 or less. The patient will also deny any auditory or visual hallucinations or delusional thinking.  4. The patient will deny any symptoms of substance withdrawal at time of discharge.   Plan:  1. Will continue the patient on the medications as listed above. 2. Laboratory studies reviewed.  3. Will continue to monitor.  4. The patient will be discharge today to enter [facility] for further treatment of his substance abuse issues.  Suicide Risk:  Minimal: No identifiable suicidal ideation.  Patients presenting with no risk factors but with morbid ruminations; may be classified as minimal risk based on the severity of the depressive symptoms  Plan Of Care/Follow-up recommendations:  Activity:  As tolerated. Diet:  Heart Healthy Diet. Other:  Please take all medications only as directed and keep all scheduled follow up appointments.  Saima Monterroso 09/10/2011, 1:12 PM

## 2011-09-10 NOTE — Progress Notes (Signed)
Patient discharged to [facility] per MD order.  Patient denies any SI/HI/AVH.  Patient received all medications, prescriptions and personal belongings.  He was cooperative and pleasant upon discharge.  He left ambulatory with [facility] representative.

## 2011-09-10 NOTE — Treatment Plan (Signed)
Interdisciplinary Treatment Plan Update (Adult)  Date: 09/10/2011  Time Reviewed: 6:05 PM   Progress in Treatment: Attending groups: Yes Participating in groups: Yes Taking medication as prescribed: Yes Tolerating medication: Yes   Family/Significant othe contact made:   Patient understands diagnosis:  Yes Discussing patient identified problems/goals with staff:  Yes Medical problems stabilized or resolved:  Yes Denies suicidal/homicidal ideation: Yes  In AM group Issues/concerns per patient self-inventory:  None noted Other:  New problem(s) identified: N/A  Reason for Continuation of Hospitalization: Other; describe D/C today  Interventions implemented related to continuation of hospitalization:   Additional comments:  Estimated length of stay:d/c today  Discharge Plan: transfer to [facility] today  New goal(s): N/A  Review of initial/current patient goals per problem list:   1.  Goal(s): Safely detox from alcohol  Met:  Yes  Target date:6/24  As evidenced ZO:[id number] vitals, CiWA score of 0  2.  Goal (s): Stabilize mood  Met:  Yes  Target date:6/24  As evidenced [id number] denied depression and rated his anxiety as minimal [after he learned that he had been accepted at [facility]]  3.  Goal(s):Eliminate SI  Met:  Yes  Target date:6/24  As evidenced [id number] denied SI in AM group  4.  Goal(s):Eliminate psychosis  Met:  Yes  Target date:6/24  As evidenced HY:[id number] denies hearing voices today  Attendees: Patient:  Adrian Alexander 09/10/2011 6:05 PM  Family:     Physician:  Harvie Heck Readling 09/10/2011 6:05 PM   Nursing:    09/10/2011 6:05 PM   Case Manager:  Richelle Ito, LCSW 09/10/2011 6:05 PM   Counselor:   09/10/2011 6:05 PM   Other:     Other:     Other:     Other:      Scribe for Treatment Team:   Ida Rogue, 09/10/2011 6:05 PM

## 2011-09-10 NOTE — Progress Notes (Addendum)
[facility] Group Notes:  (Counselor/Nursing/MHT/Case Management/Adjunct)  09/10/2011 12:22 PM  Type of Therapy:  Group Therapy at 11  Participation Level:  Active  Participation Quality:  Appropriate, Attentive and Sharing  Affect:  Appropriate  Cognitive:  Appropriate  Insight:  Good  Engagement in Group:  Good  Engagement in Therapy:  Good  Modes of Intervention:  Problem-solving and Support  Summary of Progress/Problems:  Adrian Alexander was not involved in group conflict and was in fact a calming voice. "we are in tight quarters and conflict and disagreements are going to arise."  After group settled down Adrian Alexander shared his acceptance that change will take time to become a part of daily life.  "I never made that connection between years of using and expecting changes to become natural after a week; It's not going to happen that fast is it?"    Adrian Alexander 09/10/2011, 12:22 PM

## 2011-09-11 NOTE — Progress Notes (Signed)
Patient Discharge Instructions: No consent for [facility]  Wandra Scot, 09/11/2011, 5:34 PM

## 2011-10-11 ENCOUNTER — Emergency Department (HOSPITAL_BASED_OUTPATIENT_CLINIC_OR_DEPARTMENT_OTHER): Payer: Self-pay

## 2011-10-11 ENCOUNTER — Emergency Department (HOSPITAL_BASED_OUTPATIENT_CLINIC_OR_DEPARTMENT_OTHER)
Admission: EM | Admit: 2011-10-11 | Discharge: 2011-10-11 | Disposition: A | Discharge status: Home / Self Care | Payer: Self-pay | Attending: Emergency Medicine | Admitting: Emergency Medicine

## 2011-10-11 ENCOUNTER — Encounter (HOSPITAL_BASED_OUTPATIENT_CLINIC_OR_DEPARTMENT_OTHER): Payer: Self-pay | Admitting: Emergency Medicine

## 2011-10-11 DIAGNOSIS — S93402A Sprain of unspecified ligament of left ankle, initial encounter: Secondary | ICD-10-CM

## 2011-10-11 DIAGNOSIS — Z79899 Other long term (current) drug therapy: Secondary | ICD-10-CM | POA: Insufficient documentation

## 2011-10-11 DIAGNOSIS — X500XXA Overexertion from strenuous movement or load, initial encounter: Secondary | ICD-10-CM | POA: Insufficient documentation

## 2011-10-11 DIAGNOSIS — Z8659 Personal history of other mental and behavioral disorders: Secondary | ICD-10-CM | POA: Insufficient documentation

## 2011-10-11 DIAGNOSIS — I10 Essential (primary) hypertension: Secondary | ICD-10-CM | POA: Insufficient documentation

## 2011-10-11 DIAGNOSIS — F17200 Nicotine dependence, unspecified, uncomplicated: Secondary | ICD-10-CM | POA: Insufficient documentation

## 2011-10-11 DIAGNOSIS — Y9301 Activity, walking, marching and hiking: Secondary | ICD-10-CM | POA: Insufficient documentation

## 2011-10-11 DIAGNOSIS — Y9289 Other specified places as the place of occurrence of the external cause: Secondary | ICD-10-CM | POA: Insufficient documentation

## 2011-10-11 DIAGNOSIS — S93409A Sprain of unspecified ligament of unspecified ankle, initial encounter: Secondary | ICD-10-CM | POA: Insufficient documentation

## 2011-10-11 HISTORY — DX: Schizoaffective disorder, bipolar type: F25.0

## 2011-10-11 HISTORY — DX: Schizoaffective disorder, unspecified: F25.9

## 2011-10-11 HISTORY — DX: Unspecified convulsions: R56.9

## 2011-10-11 MED ORDER — OXYCODONE-ACETAMINOPHEN 5-325 MG PO TABS: 1.0000 | ORAL_TABLET | Freq: Four times a day (QID) | ORAL | Status: AC | PRN

## 2011-10-11 MED ORDER — OXYCODONE-ACETAMINOPHEN 5-325 MG PO TABS
1.0000 | ORAL_TABLET | Freq: Once | ORAL | Status: AC
  Administered 2011-10-11: 1 via ORAL
  Filled 2011-10-11: qty 1

## 2011-10-11 NOTE — ED Notes (Signed)
Patient transported to X-ray 

## 2011-10-11 NOTE — ED Provider Notes (Signed)
History     CSN: [id number]  Arrival date & time 10/11/11  4098   First MD Initiated Contact with Patient 10/11/11 1855      Chief Complaint  Patient presents with  . Ankle Pain    (Consider location/radiation/quality/duration/timing/severity/associated sxs/prior treatment) HPI Pt presents with c/o left ankle pain.  He states he had previous ankle injury and surgical repair approx 20 years ago.  Today was walking and twisted his ankle while walking on the edge of a curb.  He now c/o pain on lateral left ankle.  Has pain with weight bearing, but has been able to ambulate.  Denies neck or back pain, no head injury.  Movement and palpation makes pain worse.  Has not taken anything for the pain. There are no other associated systemic symptoms, there are no other alleviating or modifying factors.   Past Medical History  Diagnosis Date  . Hypertension   . Seizure   . Schizo-affective schizophrenia     Past Surgical History  Procedure Date  . Ankle surgery   . Hernia repair     No family history on file.  History  Substance Use Topics  . Smoking status: Current Everyday Smoker  . Smokeless tobacco: Current User  . Alcohol Use: No      Review of Systems ROS reviewed and all otherwise negative except for mentioned in HPI  Allergies  Review of patient's allergies indicates no known allergies.  Home Medications   Current Outpatient Rx  Name Route Sig Dispense Refill  . BENZTROPINE MESYLATE PO Oral Take by mouth.    . CELEXA PO Oral Take by mouth.    . CLONIDINE HCL 0.2 MG PO TABS Oral Take 0.2 mg by mouth at bedtime.    Marland Kitchen DIVALPROEX SODIUM 250 MG PO TBEC Oral Take 250 mg by mouth 3 (three) times daily.    Marland Kitchen GABAPENTIN 300 MG PO CAPS Oral Take 300 mg by mouth 3 (three) times daily.    Marland Kitchen HALOPERIDOL PO Oral Take by mouth.    . TRAZODONE HCL PO Oral Take by mouth.    . OXYCODONE-ACETAMINOPHEN 5-325 MG PO TABS Oral Take 1-2 tablets by mouth every 6 (six) hours as needed  for pain. 15 tablet 0    BP 146/89  Pulse 68  Temp 98.6 F (37 C) (Oral)  Resp 16  Ht 6\' 2"  (1.88 m)  Wt 240 lb (108.863 kg)  BMI 30.81 kg/m2  SpO2 96% Vitals reviewed Physical Exam Physical Examination: General appearance - alert, well appearing, and in no distress Mental status - alert, oriented to person, place, and time Eyes - no scleral icterus, no conjunctival injection Neck - supple, no midline tenderness Back exam - full range of motion, no midline tenderness Neurological - alert, oriented, normal speech, strength and sensation grossly intact Musculoskeletal - mild ttp over lateral left malleolus tenderness, no deformity or swelling Extremities - peripheral pulses normal, no pedal edema, no clubbing or cyanosis Skin - normal coloration and turgor, no rashes, well healed skin over   ED Course  Procedures (including critical care time)  7:47 PM  Xray images reviewed by me  Labs Reviewed - No data to display No results found.   1. Left ankle sprain       MDM  Pt presenting with left ankle pain after twisting injury while walking today.  Xray shows no acute abnormality- hardware intact from prior ORIF.  ASO placed, will give crutches for comfort.  Pt given information  for orthopedics followup.  Given pain meds.  Discharged with strict return precautions.  Pt agreeable with plan.        Ethelda Chick, MD 10/15/11 [phone]

## 2011-10-11 NOTE — ED Notes (Signed)
Pt got too close to the edge of the sidewalk and his left ankle twisted, causing him to fall.  Pt sts he has a metal plate and screws in this ankle from a fx 20 yrs ago.  Sts he heard a "pop".

## 2012-03-05 ENCOUNTER — Emergency Department (HOSPITAL_COMMUNITY)
Admission: EM | Admit: 2012-03-05 | Discharge: 2012-03-05 | Disposition: A | Discharge status: Home / Self Care | Payer: Self-pay | Attending: Emergency Medicine | Admitting: Emergency Medicine

## 2012-03-05 ENCOUNTER — Encounter (HOSPITAL_COMMUNITY): Payer: Self-pay | Admitting: Emergency Medicine

## 2012-03-05 DIAGNOSIS — Z79899 Other long term (current) drug therapy: Secondary | ICD-10-CM | POA: Insufficient documentation

## 2012-03-05 DIAGNOSIS — M79604 Pain in right leg: Secondary | ICD-10-CM

## 2012-03-05 DIAGNOSIS — F411 Generalized anxiety disorder: Secondary | ICD-10-CM | POA: Insufficient documentation

## 2012-03-05 DIAGNOSIS — Z8669 Personal history of other diseases of the nervous system and sense organs: Secondary | ICD-10-CM | POA: Insufficient documentation

## 2012-03-05 DIAGNOSIS — M79609 Pain in unspecified limb: Secondary | ICD-10-CM | POA: Insufficient documentation

## 2012-03-05 DIAGNOSIS — I10 Essential (primary) hypertension: Secondary | ICD-10-CM | POA: Insufficient documentation

## 2012-03-05 DIAGNOSIS — F259 Schizoaffective disorder, unspecified: Secondary | ICD-10-CM | POA: Insufficient documentation

## 2012-03-05 DIAGNOSIS — R209 Unspecified disturbances of skin sensation: Secondary | ICD-10-CM | POA: Insufficient documentation

## 2012-03-05 DIAGNOSIS — F319 Bipolar disorder, unspecified: Secondary | ICD-10-CM | POA: Insufficient documentation

## 2012-03-05 DIAGNOSIS — F17200 Nicotine dependence, unspecified, uncomplicated: Secondary | ICD-10-CM | POA: Insufficient documentation

## 2012-03-05 MED ORDER — HYDROMORPHONE HCL PF 1 MG/ML IJ SOLN
1.0000 mg | Freq: Once | INTRAMUSCULAR | Status: AC
  Administered 2012-03-05: 1 mg via INTRAMUSCULAR
  Filled 2012-03-05: qty 1

## 2012-03-05 MED ORDER — ENOXAPARIN SODIUM 100 MG/ML ~~LOC~~ SOLN
100.0000 mg | Freq: Once | SUBCUTANEOUS | Status: AC
  Administered 2012-03-05: 100 mg via SUBCUTANEOUS
  Filled 2012-03-05: qty 1

## 2012-03-05 MED ORDER — KETOROLAC TROMETHAMINE 60 MG/2ML IM SOLN
60.0000 mg | Freq: Once | INTRAMUSCULAR | Status: AC
  Administered 2012-03-05: 60 mg via INTRAMUSCULAR
  Filled 2012-03-05: qty 2

## 2012-03-05 NOTE — ED Notes (Signed)
C/o right lower leg pain since yesterday.  No known injury.

## 2012-03-05 NOTE — ED Notes (Signed)
Pt reports right leg pain that started yesterday; pt denies any injury to extremity; pt states the pain is throbbing and constant; skin warm to touch with weakness noted;

## 2012-03-05 NOTE — ED Provider Notes (Signed)
History     CSN: [id number]  Arrival date & time 03/05/12  3086   First MD Initiated Contact with Patient 03/05/12 2005      Chief Complaint  Patient presents with  . Leg Pain    (Consider location/radiation/quality/duration/timing/severity/associated sxs/prior treatment) HPI History provided by pt.   Pt developed deep, throbbing pain from right mid-posterior thigh to foot yesterday evening.  It feels like his circulation is being cut off.  Aggravated by bearing weight and associated w/ numbness of forefoot.  Denies fever, rash, low back pain, extremity weakness,              .  Denies trauma.  No RF for DVT.   Past Medical History  Diagnosis Date  . Bipolar affective disorder, depressed   . Schizophrenia, schizo-affective   . Hypertension   . Anxiety   . Seizures     Last month. gets a sensation    Past Surgical History  Procedure Date  . Closed reduction with humer pin insertion     left ankle 1998  . Hernia repair     while in prison    No family history on file.  History  Substance Use Topics  . Smoking status: Current Every Day Smoker -- 0.1 packs/day for 20 years    Types: Cigarettes  . Smokeless tobacco: Current User    Types: Snuff  . Alcohol Use: 25.2 oz/week    42 Cans of beer per week     Comment: 6pk/day      Review of Systems  All other systems reviewed and are negative.    Allergies  Review of patient's allergies indicates no known allergies.  Home Medications   Current Outpatient Rx  Name  Route  Sig  Dispense  Refill  . BENZTROPINE MESYLATE 0.5 MG PO TABS   Oral   Take 1 tablet (0.5 mg total) by mouth 2 (two) times daily. For EPS.   60 tablet   0   . CITALOPRAM HYDROBROMIDE 20 MG PO TABS   Oral   Take 1 tablet (20 mg total) by mouth daily. For anxiety and depression.   30 tablet   0   . CLONIDINE HCL 0.2 MG PO TABS   Oral   Take 1 tablet (0.2 mg total) by mouth 2 (two) times daily. For hypertension.   60 tablet   0   .  DIVALPROEX SODIUM 250 MG PO TBEC   Oral   Take 1 tablet (250 mg total) by mouth 3 (three) times daily. For mood stabilization.   90 tablet   0   . GABAPENTIN 300 MG PO CAPS   Oral   Take 1 capsule (300 mg total) by mouth 3 (three) times daily. For anxiety and pain.   90 capsule   0   . HALOPERIDOL 2 MG PO TABS   Oral   Take 1 tablet (2 mg total) by mouth at bedtime. For sleep and mental clarity.   30 tablet   0   . TRAZODONE HCL 50 MG PO TABS   Oral   Take 1 tablet (50 mg total) by mouth at bedtime. For insomnia.   30 tablet   0     BP 187/117  Pulse 80  Temp 98 F (36.7 C) (Oral)  Resp 18  SpO2 95%  Physical Exam  Nursing note and vitals reviewed. Constitutional: He is oriented to person, place, and time. He appears well-developed and well-nourished. No distress.  HENT:  Head: Normocephalic and atraumatic.  Eyes:       Normal appearance  Neck: Normal range of motion.  Cardiovascular: Normal rate and regular rhythm.        hypertensive  Pulmonary/Chest: Effort normal and breath sounds normal. No respiratory distress.  Musculoskeletal: Normal range of motion.       RLE w/out deformity, edema, erythema or other skin changes.  Tenderness popliteal fossa and posterior thigh.  Pain w/ passive dorsiflexion of ankle and extreme flexion of knee.  No pain w/ ROM of hip.  Lumbar spine non-tender.  2+ DP pulse and distal sensation intact.    Neurological: He is alert and oriented to person, place, and time.  Skin: Skin is warm and dry. No rash noted.  Psychiatric: He has a normal mood and affect. His behavior is normal.    ED Course  Procedures (including critical care time)  Labs Reviewed - No data to display No results found.   1. Leg pain, right       MDM  51yo M presents w/ non-traumatic RLE pain since yesterday evening.  Low risk DVT but patient is concerned this may be the etiology and he has tenderness of popliteal space and posterior thigh on exam.  Will  order lovenox injection and outpatient venous doppler for am.  Pt received IM dilaudid and toradol as well.  D/t h/o opioid and alcohol dependence, will not prescribe pain medication.  Instructed him to return to ER if he develops CP/SOB over night.           Otilio Miu, PA-C 03/05/12 2046

## 2012-03-06 NOTE — ED Provider Notes (Signed)
Medical screening examination/treatment/procedure(s) were performed by non-physician practitioner and as supervising physician I was immediately available for consultation/collaboration.   Charles B. Sheldon, MD 03/06/12 1341 

## 2012-07-13 ENCOUNTER — Encounter (HOSPITAL_COMMUNITY): Payer: Self-pay | Admitting: *Deleted

## 2012-07-13 ENCOUNTER — Emergency Department (HOSPITAL_COMMUNITY): Payer: Self-pay

## 2012-07-13 ENCOUNTER — Emergency Department (HOSPITAL_COMMUNITY)
Admission: EM | Admit: 2012-07-13 | Discharge: 2012-07-13 | Disposition: A | Discharge status: Home / Self Care | Payer: Self-pay | Attending: Emergency Medicine | Admitting: Emergency Medicine

## 2012-07-13 DIAGNOSIS — M549 Dorsalgia, unspecified: Secondary | ICD-10-CM | POA: Insufficient documentation

## 2012-07-13 DIAGNOSIS — Z79899 Other long term (current) drug therapy: Secondary | ICD-10-CM | POA: Insufficient documentation

## 2012-07-13 DIAGNOSIS — M62838 Other muscle spasm: Secondary | ICD-10-CM

## 2012-07-13 DIAGNOSIS — F17200 Nicotine dependence, unspecified, uncomplicated: Secondary | ICD-10-CM | POA: Insufficient documentation

## 2012-07-13 DIAGNOSIS — I10 Essential (primary) hypertension: Secondary | ICD-10-CM | POA: Insufficient documentation

## 2012-07-13 DIAGNOSIS — G40909 Epilepsy, unspecified, not intractable, without status epilepticus: Secondary | ICD-10-CM | POA: Insufficient documentation

## 2012-07-13 DIAGNOSIS — R569 Unspecified convulsions: Secondary | ICD-10-CM

## 2012-07-13 DIAGNOSIS — F259 Schizoaffective disorder, unspecified: Secondary | ICD-10-CM | POA: Insufficient documentation

## 2012-07-13 LAB — COMPREHENSIVE METABOLIC PANEL
ALT: 40 U/L (ref 0–53)
AST: 34 U/L (ref 0–37)
Albumin: 3.2 g/dL — ABNORMAL LOW (ref 3.5–5.2)
Alkaline Phosphatase: 45 U/L (ref 39–117)
BUN: 11 mg/dL (ref 6–23)
CO2: 26 mEq/L (ref 19–32)
Calcium: 9.1 mg/dL (ref 8.4–10.5)
Chloride: 106 mEq/L (ref 96–112)
Creatinine, Ser: 1.03 mg/dL (ref 0.50–1.35)
GFR calc Af Amer: >90 mL/min (ref >90)
GFR calc non Af Amer: 84 mL/min — ABNORMAL LOW (ref >90)
Glucose, Bld: 93 mg/dL (ref 70–99)
Potassium: 4 mEq/L (ref 3.5–5.1)
Sodium: 138 mEq/L (ref 135–145)
Total Bilirubin: 0.3 mg/dL (ref 0.3–1.2)
Total Protein: 6.1 g/dL (ref 6.0–8.3)

## 2012-07-13 LAB — URINALYSIS, ROUTINE W REFLEX MICROSCOPIC
Glucose, UA: NEGATIVE mg/dL
Hgb urine dipstick: NEGATIVE
Ketones, ur: NEGATIVE mg/dL
Leukocytes, UA: NEGATIVE
Nitrite: NEGATIVE
Protein, ur: NEGATIVE mg/dL
Specific Gravity, Urine: 1.029 (ref 1.005–1.030)
Urobilinogen, UA: 1 mg/dL (ref 0.0–1.0)
pH: 7 (ref 5.0–8.0)

## 2012-07-13 LAB — VALPROIC ACID LEVEL: Valproic Acid Lvl: <10 ug/mL — ABNORMAL LOW (ref 50.0–100.0)

## 2012-07-13 LAB — CBC
HCT: 42.8 % (ref 39.0–52.0)
Hemoglobin: 14.5 g/dL (ref 13.0–17.0)
MCH: 25.6 pg — ABNORMAL LOW (ref 26.0–34.0)
MCHC: 33.9 g/dL (ref 30.0–36.0)
MCV: 75.5 fL — ABNORMAL LOW (ref 78.0–100.0)
Platelets: 245 10*3/uL (ref 150–400)
RBC: 5.67 MIL/uL (ref 4.22–5.81)
RDW: 13.2 % (ref 11.5–15.5)
WBC: 4.9 10*3/uL (ref 4.0–10.5)

## 2012-07-13 MED ORDER — HYDROCODONE-ACETAMINOPHEN 5-325 MG PO TABS: 1.0000 | ORAL_TABLET | Freq: Four times a day (QID) | ORAL | Status: DC | PRN

## 2012-07-13 MED ORDER — NAPROXEN 500 MG PO TABS: 500.0000 mg | ORAL_TABLET | Freq: Two times a day (BID) | ORAL | Status: DC

## 2012-07-13 MED ORDER — KETOROLAC TROMETHAMINE 30 MG/ML IJ SOLN
15.0000 mg | Freq: Once | INTRAMUSCULAR | Status: AC
  Administered 2012-07-13: 15 mg via INTRAVENOUS
  Filled 2012-07-13: qty 1

## 2012-07-13 MED ORDER — SODIUM CHLORIDE 0.9 % IV SOLN
INTRAVENOUS | Status: DC
  Administered 2012-07-13: 16:00:00 via INTRAVENOUS

## 2012-07-13 MED ORDER — LORAZEPAM 2 MG/ML IJ SOLN
1.0000 mg | Freq: Once | INTRAMUSCULAR | Status: AC
  Administered 2012-07-13: 1 mg via INTRAVENOUS
  Filled 2012-07-13: qty 1

## 2012-07-13 MED ORDER — HYDROMORPHONE HCL PF 1 MG/ML IJ SOLN
0.5000 mg | Freq: Once | INTRAMUSCULAR | Status: AC
  Administered 2012-07-13: 0.5 mg via INTRAVENOUS
  Filled 2012-07-13: qty 1

## 2012-07-13 MED ORDER — ONDANSETRON HCL 4 MG/2ML IJ SOLN
4.0000 mg | Freq: Once | INTRAMUSCULAR | Status: AC
  Administered 2012-07-13: 4 mg via INTRAVENOUS
  Filled 2012-07-13: qty 2

## 2012-07-13 MED ORDER — DIAZEPAM 5 MG PO TABS: 5.0000 mg | ORAL_TABLET | Freq: Four times a day (QID) | ORAL | Status: DC | PRN

## 2012-07-13 MED ORDER — DIAZEPAM 5 MG/ML IJ SOLN
5.0000 mg | Freq: Once | INTRAMUSCULAR | Status: AC
  Administered 2012-07-13: 5 mg via INTRAVENOUS
  Filled 2012-07-13: qty 2

## 2012-07-13 MED ORDER — HYDROMORPHONE HCL PF 1 MG/ML IJ SOLN
1.0000 mg | Freq: Once | INTRAMUSCULAR | Status: AC
  Administered 2012-07-13: 1 mg via INTRAVENOUS
  Filled 2012-07-13: qty 1

## 2012-07-13 MED ORDER — VALPROATE SODIUM 500 MG/5ML IV SOLN
1000.0000 mg | Freq: Once | INTRAVENOUS | Status: AC
  Administered 2012-07-13: 1000 mg via INTRAVENOUS
  Filled 2012-07-13: qty 10

## 2012-07-13 NOTE — ED Notes (Signed)
Pt aware of the need for a urine sample, urinal at bedside. 

## 2012-07-13 NOTE — ED Notes (Signed)
Pt sts had a seizure this am and fell down hurting his lower back. Pt reports pain 8/10 in his lower back.

## 2012-07-13 NOTE — ED Notes (Signed)
Pt given turkey sandwich and water as requested. 

## 2012-07-13 NOTE — ED Notes (Signed)
[id number]<JY> Expected date:<BR> Expected time:<BR> Means of arrival:<BR> Comments:<BR> Triage

## 2012-07-13 NOTE — ED Provider Notes (Signed)
History     CSN: [id number]  Arrival date & time 07/13/12  1132   First MD Initiated Contact with Patient 07/13/12 1421      No chief complaint on file.   (Consider location/radiation/quality/duration/timing/severity/associated sxs/prior treatment) Patient is a 52 y.o. male presenting with seizures. The history is provided by the patient.  Seizures Seizure activity on arrival: no   Seizure type:  Unable to specify Preceding symptoms: aura   Initial focality:  Unable to specify Episode characteristics: generalized shaking and tongue biting (lip biting )   Postictal symptoms: confusion, memory loss and somnolence   Return to baseline: yes   Severity:  Moderate Duration:  2 minutes Timing:  Once Number of seizures this episode:  1 Progression:  Resolved Context: stress (heat stress, was mowing lawn )   Context: not alcohol withdrawal, not cerebral palsy, not change in medication, not sleeping less, not developmental delay, not drug use, not emotional upset, not family hx of seizures, not fever, not flashing visual stimuli, not hydrocephalus, not intracranial lesion, not intracranial shunt, medical compliance, not possible hypoglycemia, not possible medication ingestion, not pregnant and not previous head injury   Recent head injury:  No recent head injuries PTA treatment:  None History of seizures: yes     Past Medical History  Diagnosis Date  . Hypertension   . Seizure   . Schizo-affective schizophrenia     Past Surgical History  Procedure Laterality Date  . Ankle surgery    . Hernia repair      History reviewed. No pertinent family history.  History  Substance Use Topics  . Smoking status: Current Every Day Smoker  . Smokeless tobacco: Current User  . Alcohol Use: No      Review of Systems  Neurological: Positive for seizures.  All other systems reviewed and are negative.    Allergies  Review of patient's allergies indicates no known allergies.  Home  Medications   Current Outpatient Rx  Name  Route  Sig  Dispense  Refill  . benztropine (COGENTIN) 1 MG tablet   Oral   Take 1 mg by mouth 2 (two) times daily.         . citalopram (CELEXA) 20 MG tablet   Oral   Take 20 mg by mouth daily.         . cloNIDine (CATAPRES) 0.2 MG tablet   Oral   Take 0.2 mg by mouth 2 (two) times daily.          . divalproex (DEPAKOTE) 250 MG DR tablet   Oral   Take 250 mg by mouth 3 (three) times daily.         Marland Kitchen gabapentin (NEURONTIN) 300 MG capsule   Oral   Take 300 mg by mouth 3 (three) times daily.         . haloperidol (HALDOL) 2 MG tablet   Oral   Take 2 mg by mouth at bedtime.         . traZODone (DESYREL) 50 MG tablet   Oral   Take 50 mg by mouth at bedtime.           BP 109/58  Pulse 56  Temp(Src) 98.3 F (36.8 C) (Oral)  Resp 18  Wt 231 lb (104.781 kg)  BMI 29.65 kg/m2  SpO2 98%  Physical Exam  Nursing note and vitals reviewed. Constitutional: He appears well-developed and well-nourished. No distress.  Post ictal, mildly altered, Hypertensive   HENT:  Head: Normocephalic.  Atraumatic, No evidence of tongue oral lacerations but there is a mild superficial lip bite   Eyes: EOM are normal. Pupils are equal, round, and reactive to light.  Neck: Normal range of motion. Neck supple.  Cervical spinous process non tender without step offs, no difficulty or pain with flexion or extension of neck  Cardiovascular: Normal rate, regular rhythm, normal heart sounds and intact distal pulses.   Pulmonary/Chest: Breath sounds normal. No respiratory distress. He has no wheezes. He has no rales.  Abdominal: Soft. There is no tenderness.  Musculoskeletal: He exhibits no edema and no tenderness.  ttp from thoracic spine to coccyx. Full active & passive ROM of arms bilaterally  Neurological:  CN III-VII intact. Iintact coordination, sensation, and motor (finger grip, biceps, hamstrings & dorsiflexion). No pass pointing, good  rapid coordination. Gait normal.   Skin: Skin is warm and dry. He is not diaphoretic.  intact    ED Course  Procedures (including critical care time)  Labs Reviewed  CBC - Abnormal; Notable for the following:    MCV 75.5 (*)    MCH 25.6 (*)    All other components within normal limits  COMPREHENSIVE METABOLIC PANEL - Abnormal; Notable for the following:    Albumin 3.2 (*)    GFR calc non Af Amer 84 (*)    All other components within normal limits  URINALYSIS, ROUTINE W REFLEX MICROSCOPIC - Abnormal; Notable for the following:    Color, Urine AMBER (*)    APPearance CLOUDY (*)    Bilirubin Urine SMALL (*)    All other components within normal limits  VALPROIC ACID LEVEL   Dg Thoracic Spine 2 View  07/13/2012  *RADIOLOGY REPORT*  Clinical Data: Fall with mid back pain.  THORACIC SPINE - 2 VIEW  Comparison: None  Findings: Normal alignment is noted. There is no evidence of fracture or subluxation. No focal bony lesions are identified. The disc spaces are maintained.  IMPRESSION: No acute bony abnormality.   Original Report Authenticated By: Harmon Pier, M.D.    Dg Lumbar Spine Complete  07/13/2012  *RADIOLOGY REPORT*  Clinical Data: Fall with low back pain.  LUMBAR SPINE - COMPLETE 4+ VIEW  Comparison: None  Findings: Five non-rib bearing lumbar type vertebra are identified in normal alignment. There is no evidence of fracture or subluxation. No focal bony lesions or spondylolysis identified. The disc spaces are maintained except for mild degenerative disc disease at L5-S1.  IMPRESSION: No evidence of acute bony abnormality.  Mild degenerative disc disease at L5-S1.   Original Report Authenticated By: Harmon Pier, M.D.    Dg Sacrum/coccyx  07/13/2012  *RADIOLOGY REPORT*  Clinical Data: Fall with sacral/coccyx pain.  SACRUM AND COCCYX - 2+ VIEW  Comparison: None  Findings: There is no evidence of fracture or subluxation. The visualized bony structures are unremarkable. No focal bony lesions  are identified. The visualized joints are unremarkable.  IMPRESSION: No evidence of acute bony abnormality.   Original Report Authenticated By: Harmon Pier, M.D.      No diagnosis found.    MDM  Patient with no evidence of focal neuro deficits on physical exam and is at mental baseline.  Labs and imaging have been reviewed.  Depakote level low, loading dose given. Patient is advised to followup with neurologist in regards to today's event.  Spoke with patient and family in detail about driving restrictions until cleared by a neurologist.  Patient verbalizes understanding.  Answered all questions.  Patient is hemodynamically stable  and in no acute distress prior to discharge.          Jaci Carrel, [place] 07/13/12 1751

## 2012-07-13 NOTE — ED Notes (Signed)
He tells me that as he was mowing a lawn; he had a seizure and fell onto a sidewalk.  He c/o low back pain radiating into right leg/foot.  He is oriented x 4 and in in no distress.  He denies l.o.c. And denies any neck pain.

## 2012-07-13 NOTE — ED Notes (Signed)
gi

## 2012-07-16 NOTE — ED Provider Notes (Signed)
Medical screening examination/treatment/procedure(s) were performed by non-physician practitioner and as supervising physician I was immediately available for consultation/collaboration.  Alaiya Martindelcampo L Mikia Delaluz, MD 07/16/12 0057 

## 2014-07-21 ENCOUNTER — Other Ambulatory Visit (HOSPITAL_COMMUNITY)
Admission: RE | Admit: 2014-07-21 | Discharge: 2014-07-21 | Disposition: A | Discharge status: Home / Self Care | Payer: Self-pay | Source: Ambulatory Visit | Attending: Nurse Practitioner | Admitting: Nurse Practitioner

## 2014-07-23 ENCOUNTER — Encounter (HOSPITAL_COMMUNITY): Payer: Self-pay | Admitting: *Deleted

## 2014-10-12 ENCOUNTER — Encounter (HOSPITAL_COMMUNITY): Payer: Self-pay | Admitting: Emergency Medicine

## 2014-10-12 ENCOUNTER — Emergency Department (HOSPITAL_COMMUNITY)
Admission: EM | Admit: 2014-10-12 | Discharge: 2014-10-12 | Disposition: A | Discharge status: Home / Self Care | Payer: Self-pay | Attending: Emergency Medicine | Admitting: Emergency Medicine

## 2014-10-12 DIAGNOSIS — G40909 Epilepsy, unspecified, not intractable, without status epilepticus: Secondary | ICD-10-CM | POA: Insufficient documentation

## 2014-10-12 DIAGNOSIS — Z791 Long term (current) use of non-steroidal anti-inflammatories (NSAID): Secondary | ICD-10-CM | POA: Insufficient documentation

## 2014-10-12 DIAGNOSIS — H538 Other visual disturbances: Secondary | ICD-10-CM | POA: Insufficient documentation

## 2014-10-12 DIAGNOSIS — I10 Essential (primary) hypertension: Secondary | ICD-10-CM | POA: Insufficient documentation

## 2014-10-12 DIAGNOSIS — H5711 Ocular pain, right eye: Secondary | ICD-10-CM | POA: Insufficient documentation

## 2014-10-12 DIAGNOSIS — Z79899 Other long term (current) drug therapy: Secondary | ICD-10-CM | POA: Insufficient documentation

## 2014-10-12 DIAGNOSIS — F319 Bipolar disorder, unspecified: Secondary | ICD-10-CM | POA: Insufficient documentation

## 2014-10-12 DIAGNOSIS — F419 Anxiety disorder, unspecified: Secondary | ICD-10-CM | POA: Insufficient documentation

## 2014-10-12 DIAGNOSIS — Z720 Tobacco use: Secondary | ICD-10-CM | POA: Insufficient documentation

## 2014-10-12 DIAGNOSIS — F209 Schizophrenia, unspecified: Secondary | ICD-10-CM | POA: Insufficient documentation

## 2014-10-12 MED ORDER — FLUORESCEIN SODIUM 1 MG OP STRP
1.0000 | ORAL_STRIP | Freq: Once | OPHTHALMIC | Status: AC
  Administered 2014-10-12: 1 via OPHTHALMIC
  Filled 2014-10-12: qty 1

## 2014-10-12 MED ORDER — TETRACAINE HCL 0.5 % OP SOLN
2.0000 [drp] | Freq: Once | OPHTHALMIC | Status: AC
  Administered 2014-10-12: 2 [drp] via OPHTHALMIC
  Filled 2014-10-12: qty 2

## 2014-10-12 NOTE — Discharge Instructions (Signed)
Please read and follow all provided instructions.  Your diagnoses today include:  1. Eye pain, right    Tests performed today include:  Visual acuity testing to check your vision  Fluorescein dye examination to look for scratches on your eye  Tonometry to check the pressure inside of your eye  Vital signs. See below for your results today.   Medications prescribed:   None  Take any prescribed medications only as directed.  Home care instructions:  Follow any educational materials contained in this packet.   If you have an eye infection, wash your hands often as this is very contagious and is easily spread from person to person.   Follow-up instructions: Go to the eye doctor's office at 3:30pm.   Return instructions:   Please return to the Emergency Department if you experience worsening symptoms.   Please return immediately if you develop severe pain, pus drainage, new change in vision, or fever.  Please return if you have any other emergent concerns.  Additional Information:  Your vital signs today were: BP 147/88 mmHg   Pulse 68   Temp(Src) 98.3 F (36.8 C) (Oral)   Resp 18   SpO2 96% If your blood pressure (BP) was elevated above 135/85 this visit, please have this repeated by your doctor within one month. ---------------

## 2014-10-12 NOTE — ED Provider Notes (Signed)
CSN: [id number]     Arrival date & time 10/12/14  1057 History  This chart was scribed for non-physician practitioner, [name], PA-C, working with Blane Ohara, MD by Charline Bills, ED Scribe. This patient was seen in room TR04C/TR04C and the patient's care was started at 12:22 PM.   Chief Complaint  Patient presents with  . Eye Pain  . Eye Problem   The history is provided by the patient. No language interpreter was used.   HPI Comments: Adrian Alexander is a 54 y.o. male, with a h/o HTN, who presents to the Emergency Department complaining of constant right eye pain onset 2 days ago. 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 He reports associated eye drainage (tearing), eye redness, photophobia and blurred vision for the past 2 days. No treatments tried PTA. He denies eye crusting, foreign bodies, nausea and vomiting. No h/o glaucoma.   Patient states that his last episode of similar symptoms was approximately 2 years ago. He was seen in the emergency department and was treated with 2 medications, one of which was a white colored medication "like milk" (probably a steroid-dependent medication). Patient decided not to follow-up and symptoms eventually improved.  Past Medical History  Diagnosis Date  . Bipolar affective disorder, depressed   . Schizophrenia, schizo-affective   . Anxiety   . Seizures     Last month. gets a sensation  . Hypertension   . Seizure   . Schizo-affective schizophrenia    Past Surgical History  Procedure Laterality Date  . Closed reduction with humer pin insertion      left ankle 1998  . Hernia repair      while in prison  . Ankle surgery    . Hernia repair     No family history on file. History  Substance Use Topics  . Smoking status: Current Every Day Smoker -- 0.10 packs/day for 20 years    Types: Cigarettes  . Smokeless tobacco: Current User    Types: Snuff  . Alcohol Use: No     Comment: 6pk/day     Review of Systems  Constitutional: Negative for fever.  HENT: Negative for rhinorrhea and sore throat.   Eyes: Positive for photophobia, pain, discharge, redness and visual disturbance (blurry vision). Negative for itching.  Respiratory: Negative for cough.   Cardiovascular: Negative for chest pain.  Gastrointestinal: Negative for nausea, vomiting, abdominal pain and diarrhea.  Genitourinary: Negative for dysuria.  Musculoskeletal: Negative for myalgias.  Skin: Negative for rash.  Neurological: Negative for headaches.   Allergies  Review of patient's allergies indicates no known allergies.  Home Medications   Prior to Admission medications   Medication Sig Start Date End Date Taking? Authorizing Provider  benztropine (COGENTIN) 0.5 MG tablet Take 1 tablet (0.5 mg total) by mouth 2 (two) times daily. For EPS. 09/10/11   Tamala Julian, PA-C  benztropine (COGENTIN) 1 MG tablet Take 1 mg by mouth 2 (two) times daily.    Historical Provider, MD  citalopram (CELEXA) 20 MG tablet Take 20 mg by mouth daily.    Historical Provider, MD  cloNIDine (CATAPRES) 0.2 MG tablet Take 1 tablet (0.2 mg total) by mouth 2 (two) times daily. For hypertension. 09/10/11   Tamala Julian, PA-C  cloNIDine (CATAPRES) 0.2 MG tablet Take 0.2 mg by mouth 2 (two) times daily.     Historical Provider, MD  diazepam (VALIUM) 5 MG tablet Take 1 tablet (5 mg  total) by mouth every 6 (six) hours as needed for anxiety. 07/13/12   Lisette Paz, PA-C  divalproex (DEPAKOTE) 250 MG DR tablet Take 1 tablet (250 mg total) by mouth 3 (three) times daily. For mood stabilization. 09/10/11   Tamala Julian, PA-C  divalproex (DEPAKOTE) 250 MG DR tablet Take 250 mg by mouth 3 (three) times daily.    Historical Provider, MD  gabapentin (NEURONTIN) 300 MG capsule Take 1 capsule (300 mg total) by mouth 3 (three) times daily. For anxiety and pain. 09/10/11   Tamala Julian, PA-C  gabapentin (NEURONTIN) 300 MG capsule Take 300 mg by mouth  3 (three) times daily.    Historical Provider, MD  haloperidol (HALDOL) 2 MG tablet Take 2 mg by mouth at bedtime.    Historical Provider, MD  HYDROcodone-acetaminophen (NORCO/VICODIN) 5-325 MG per tablet Take 1 tablet by mouth every 6 (six) hours as needed for pain. 07/13/12   Lisette Paz, PA-C  naproxen (NAPROSYN) 500 MG tablet Take 1 tablet (500 mg total) by mouth 2 (two) times daily. 07/13/12   Lisette Paz, PA-C  traZODone (DESYREL) 50 MG tablet Take 50 mg by mouth at bedtime.    Historical Provider, MD   BP 147/88 mmHg  Pulse 68  Temp(Src) 98.3 F (36.8 C) (Oral)  Resp 18  SpO2 96%  Physical Exam  Constitutional: He appears well-developed and well-nourished. No distress.  HENT:  Head: Normocephalic and atraumatic.  Eyes: EOM are normal. Pupils are equal, round, and reactive to light. Right eye exhibits discharge (tearing). Right eye exhibits no chemosis. No foreign body present in the right eye. Left eye exhibits no chemosis and no discharge. No foreign body present in the left eye. Right conjunctiva is injected. Right conjunctiva has no hemorrhage. Left conjunctiva is not injected. Left conjunctiva has no hemorrhage. Right eye exhibits normal extraocular motion. Left eye exhibits normal extraocular motion.  Slit lamp exam:      The right eye shows no corneal abrasion, no corneal ulcer, no foreign body and no fluorescein uptake.  Significant photophobia with light shined in right eye. Patient also has pain in right eye when light shined into left eye.  Neck: Normal range of motion. Neck supple. No tracheal deviation present.  Cardiovascular: Normal rate, regular rhythm and normal heart sounds.   Pulmonary/Chest: Effort normal and breath sounds normal. No respiratory distress.  Abdominal: Soft. There is no tenderness.  Musculoskeletal: Normal range of motion.  Neurological: He is alert.  Skin: Skin is warm and dry.  Psychiatric: He has a normal mood and affect. His behavior is normal.   Nursing note and vitals reviewed.  ED Course  Procedures (including critical care time) DIAGNOSTIC STUDIES: Oxygen Saturation is 96% on RA, adequate by my interpretation.    COORDINATION OF CARE: 12:28 PM-Discussed treatment plan which includes fluorescein exam, tonometry and follow-up with ophthalmology with pt at bedside and pt agreed to plan.   Labs Review Labs Reviewed - No data to display  Imaging Review No results found.   EKG Interpretation None       Two drops of tetracaine instilled into affected eye.   Fluorescein strip applied to affected eye. Slit lamp used to assess for corneal abrasion. No corneal abrasion identified. No foreign bodies noted. No visible hyphema.   Tonometry performed. Right eye pressure: 14,14  Patient tolerated procedure well without immediate complication.    Spoke with Dr. Clarisa Kindred. She will see in office this afternoon at 3:30pm. Will withhold steroid drops  at this time 2/2 poor compliance and not following up in past.   Patient informed of plan and agrees. Will discharge and he is to go to ophthalmologist office for follow-up.   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 No foreign bodies noted. No surrounding erythema, swelling, vision loss suspicious for orbital or periorbital cellulitis. No signs of glaucoma, intraocular pressures normal. No symptoms of retinal detachment. Ophthalmologic follow-up obtained.  I personally performed the services described in this documentation, which was scribed in my presence. The recorded information has been reviewed and is accurate.    [name], PA-C 10/12/14 1407  Blane Ohara, MD 10/12/14 [phone]

## 2014-10-12 NOTE — ED Notes (Signed)
Pt. Stated, I've got this eye pain and drainage for 2 days I have an old eye injury and it flares up about every 2 years.

## 2015-01-24 ENCOUNTER — Encounter (HOSPITAL_COMMUNITY): Payer: Self-pay | Admitting: *Deleted

## 2015-01-24 ENCOUNTER — Inpatient Hospital Stay (HOSPITAL_COMMUNITY)
Admission: AD | Admit: 2015-01-24 | Discharge: 2015-01-28 | DRG: 881 | Disposition: A | Discharge status: Home / Self Care | Payer: Medicaid Other | Source: Intra-hospital | Attending: Emergency Medicine | Admitting: Emergency Medicine

## 2015-01-24 ENCOUNTER — Encounter (HOSPITAL_COMMUNITY): Payer: Self-pay | Admitting: Emergency Medicine

## 2015-01-24 ENCOUNTER — Emergency Department (HOSPITAL_COMMUNITY)
Admission: EM | Admit: 2015-01-24 | Discharge: 2015-01-24 | Disposition: A | Discharge status: Home / Self Care | Payer: Medicaid Other | Attending: Emergency Medicine | Admitting: Emergency Medicine

## 2015-01-24 ENCOUNTER — Ambulatory Visit (HOSPITAL_COMMUNITY)
Admission: RE | Admit: 2015-01-24 | Discharge: 2015-01-24 | Disposition: A | Discharge status: Home / Self Care | Payer: Medicaid Other | Attending: Psychiatry | Admitting: Psychiatry

## 2015-01-24 DIAGNOSIS — F1020 Alcohol dependence, uncomplicated: Secondary | ICD-10-CM | POA: Diagnosis not present

## 2015-01-24 DIAGNOSIS — F319 Bipolar disorder, unspecified: Secondary | ICD-10-CM | POA: Insufficient documentation

## 2015-01-24 DIAGNOSIS — F322 Major depressive disorder, single episode, severe without psychotic features: Secondary | ICD-10-CM | POA: Diagnosis present

## 2015-01-24 DIAGNOSIS — Z791 Long term (current) use of non-steroidal anti-inflammatories (NSAID): Secondary | ICD-10-CM | POA: Diagnosis not present

## 2015-01-24 DIAGNOSIS — Y904 Blood alcohol level of 80-99 mg/100 ml: Secondary | ICD-10-CM | POA: Diagnosis present

## 2015-01-24 DIAGNOSIS — F4310 Post-traumatic stress disorder, unspecified: Secondary | ICD-10-CM | POA: Diagnosis present

## 2015-01-24 DIAGNOSIS — H5710 Ocular pain, unspecified eye: Secondary | ICD-10-CM

## 2015-01-24 DIAGNOSIS — H5711 Ocular pain, right eye: Secondary | ICD-10-CM | POA: Diagnosis present

## 2015-01-24 DIAGNOSIS — F329 Major depressive disorder, single episode, unspecified: Secondary | ICD-10-CM | POA: Diagnosis present

## 2015-01-24 DIAGNOSIS — F10239 Alcohol dependence with withdrawal, unspecified: Secondary | ICD-10-CM | POA: Diagnosis present

## 2015-01-24 DIAGNOSIS — F259 Schizoaffective disorder, unspecified: Secondary | ICD-10-CM | POA: Diagnosis present

## 2015-01-24 DIAGNOSIS — G4700 Insomnia, unspecified: Secondary | ICD-10-CM | POA: Diagnosis present

## 2015-01-24 DIAGNOSIS — Z79899 Other long term (current) drug therapy: Secondary | ICD-10-CM | POA: Diagnosis not present

## 2015-01-24 DIAGNOSIS — F1420 Cocaine dependence, uncomplicated: Secondary | ICD-10-CM | POA: Insufficient documentation

## 2015-01-24 DIAGNOSIS — F1210 Cannabis abuse, uncomplicated: Secondary | ICD-10-CM | POA: Diagnosis not present

## 2015-01-24 DIAGNOSIS — F410 Panic disorder [episodic paroxysmal anxiety] without agoraphobia: Secondary | ICD-10-CM | POA: Diagnosis present

## 2015-01-24 DIAGNOSIS — I10 Essential (primary) hypertension: Secondary | ICD-10-CM | POA: Diagnosis present

## 2015-01-24 DIAGNOSIS — G40909 Epilepsy, unspecified, not intractable, without status epilepticus: Secondary | ICD-10-CM | POA: Diagnosis not present

## 2015-01-24 DIAGNOSIS — Z008 Encounter for other general examination: Secondary | ICD-10-CM | POA: Diagnosis present

## 2015-01-24 DIAGNOSIS — F1414 Cocaine abuse with cocaine-induced mood disorder: Secondary | ICD-10-CM | POA: Diagnosis present

## 2015-01-24 DIAGNOSIS — R45851 Suicidal ideations: Secondary | ICD-10-CM | POA: Diagnosis present

## 2015-01-24 DIAGNOSIS — F1994 Other psychoactive substance use, unspecified with psychoactive substance-induced mood disorder: Secondary | ICD-10-CM | POA: Diagnosis present

## 2015-01-24 DIAGNOSIS — Z720 Tobacco use: Secondary | ICD-10-CM | POA: Diagnosis not present

## 2015-01-24 DIAGNOSIS — F17210 Nicotine dependence, cigarettes, uncomplicated: Secondary | ICD-10-CM | POA: Diagnosis present

## 2015-01-24 DIAGNOSIS — F419 Anxiety disorder, unspecified: Secondary | ICD-10-CM | POA: Insufficient documentation

## 2015-01-24 DIAGNOSIS — F10230 Alcohol dependence with withdrawal, uncomplicated: Secondary | ICD-10-CM | POA: Diagnosis not present

## 2015-01-24 DIAGNOSIS — H209 Unspecified iridocyclitis: Secondary | ICD-10-CM

## 2015-01-24 LAB — COMPREHENSIVE METABOLIC PANEL
ALT: 28 U/L (ref 17–63)
AST: 32 U/L (ref 15–41)
Albumin: 4.5 g/dL (ref 3.5–5.0)
Alkaline Phosphatase: 49 U/L (ref 38–126)
Anion gap: 9 (ref 5–15)
BUN: 11 mg/dL (ref 6–20)
CO2: 25 mmol/L (ref 22–32)
Calcium: 9.2 mg/dL (ref 8.9–10.3)
Chloride: 105 mmol/L (ref 101–111)
Creatinine, Ser: 0.95 mg/dL (ref 0.61–1.24)
GFR calc Af Amer: >60 mL/min (ref >60)
GFR calc non Af Amer: >60 mL/min (ref >60)
Glucose, Bld: 79 mg/dL (ref 65–99)
Potassium: 3.8 mmol/L (ref 3.5–5.1)
Sodium: 139 mmol/L (ref 135–145)
Total Bilirubin: 0.9 mg/dL (ref 0.3–1.2)
Total Protein: 7.7 g/dL (ref 6.5–8.1)

## 2015-01-24 LAB — RAPID URINE DRUG SCREEN, HOSP PERFORMED
Amphetamines: NOT DETECTED
Barbiturates: NOT DETECTED
Benzodiazepines: NOT DETECTED
Cocaine: NOT DETECTED
Opiates: NOT DETECTED
Tetrahydrocannabinol: POSITIVE — AB

## 2015-01-24 LAB — CBC
HCT: 49.1 % (ref 39.0–52.0)
Hemoglobin: 16.3 g/dL (ref 13.0–17.0)
MCH: 26.5 pg (ref 26.0–34.0)
MCHC: 33.2 g/dL (ref 30.0–36.0)
MCV: 79.7 fL (ref 78.0–100.0)
Platelets: 259 10*3/uL (ref 150–400)
RBC: 6.16 MIL/uL — ABNORMAL HIGH (ref 4.22–5.81)
RDW: 14.1 % (ref 11.5–15.5)
WBC: 5.6 10*3/uL (ref 4.0–10.5)

## 2015-01-24 LAB — ETHANOL: Alcohol, Ethyl (B): 98 mg/dL — ABNORMAL HIGH (ref <5)

## 2015-01-24 MED ORDER — PHENYTOIN SODIUM EXTENDED 100 MG PO CAPS
300.0000 mg | ORAL_CAPSULE | Freq: Two times a day (BID) | ORAL | Status: DC
  Administered 2015-01-24 – 2015-01-25 (×2): 300 mg via ORAL
  Filled 2015-01-24 (×5): qty 3

## 2015-01-24 MED ORDER — ADULT MULTIVITAMIN W/MINERALS CH
1.0000 | ORAL_TABLET | Freq: Every day | ORAL | Status: DC
  Administered 2015-01-24: 1 via ORAL
  Filled 2015-01-24 (×3): qty 1

## 2015-01-24 MED ORDER — ADULT MULTIVITAMIN W/MINERALS CH
1.0000 | ORAL_TABLET | Freq: Every day | ORAL | Status: DC
  Administered 2015-01-24 – 2015-01-28 (×5): 1 via ORAL
  Filled 2015-01-24 (×7): qty 1

## 2015-01-24 MED ORDER — THIAMINE HCL 100 MG/ML IJ SOLN: 100.0000 mg | Freq: Once | INTRAMUSCULAR | Status: DC

## 2015-01-24 MED ORDER — CHLORDIAZEPOXIDE HCL 25 MG PO CAPS: 25.0000 mg | ORAL_CAPSULE | Freq: Four times a day (QID) | ORAL | Status: DC | PRN

## 2015-01-24 MED ORDER — CHLORDIAZEPOXIDE HCL 25 MG PO CAPS: 25.0000 mg | ORAL_CAPSULE | Freq: Every day | ORAL | Status: DC

## 2015-01-24 MED ORDER — IBUPROFEN 600 MG PO TABS
600.0000 mg | ORAL_TABLET | Freq: Four times a day (QID) | ORAL | Status: DC | PRN
  Administered 2015-01-24 – 2015-01-25 (×2): 600 mg via ORAL
  Filled 2015-01-24 (×2): qty 1

## 2015-01-24 MED ORDER — VITAMIN D3 25 MCG (1000 UNIT) PO TABS
2000.0000 [IU] | ORAL_TABLET | Freq: Every day | ORAL | Status: DC
  Administered 2015-01-24 – 2015-01-28 (×5): 2000 [IU] via ORAL
  Filled 2015-01-24 (×9): qty 2

## 2015-01-24 MED ORDER — CHLORDIAZEPOXIDE HCL 25 MG PO CAPS
25.0000 mg | ORAL_CAPSULE | ORAL | Status: AC
  Administered 2015-01-27 (×2): 25 mg via ORAL
  Filled 2015-01-24 (×2): qty 1

## 2015-01-24 MED ORDER — CHLORDIAZEPOXIDE HCL 25 MG PO CAPS
25.0000 mg | ORAL_CAPSULE | Freq: Three times a day (TID) | ORAL | Status: AC
  Administered 2015-01-26 (×3): 25 mg via ORAL
  Filled 2015-01-24 (×3): qty 1

## 2015-01-24 MED ORDER — CLONIDINE HCL 0.3 MG PO TABS
0.3000 mg | ORAL_TABLET | Freq: Two times a day (BID) | ORAL | Status: DC
  Administered 2015-01-24 – 2015-01-28 (×7): 0.3 mg via ORAL
  Filled 2015-01-24 (×3): qty 1
  Filled 2015-01-24: qty 3
  Filled 2015-01-24 (×7): qty 1

## 2015-01-24 MED ORDER — ENSURE ENLIVE PO LIQD
237.0000 mL | Freq: Two times a day (BID) | ORAL | Status: DC
  Administered 2015-01-25 – 2015-01-28 (×6): 237 mL via ORAL
  Filled 2015-01-24: qty 237

## 2015-01-24 MED ORDER — CHLORDIAZEPOXIDE HCL 25 MG PO CAPS
25.0000 mg | ORAL_CAPSULE | Freq: Four times a day (QID) | ORAL | Status: AC
  Administered 2015-01-24 – 2015-01-25 (×5): 25 mg via ORAL
  Filled 2015-01-24 (×5): qty 1

## 2015-01-24 MED ORDER — LOPERAMIDE HCL 2 MG PO CAPS: 2.0000 mg | ORAL_CAPSULE | ORAL | Status: DC | PRN

## 2015-01-24 MED ORDER — INFLUENZA VAC SPLIT QUAD 0.5 ML IM SUSY
0.5000 mL | PREFILLED_SYRINGE | INTRAMUSCULAR | Status: DC
  Filled 2015-01-24: qty 0.5

## 2015-01-24 MED ORDER — LISINOPRIL 40 MG PO TABS
40.0000 mg | ORAL_TABLET | Freq: Every day | ORAL | Status: DC
  Administered 2015-01-24 – 2015-01-28 (×5): 40 mg via ORAL
  Filled 2015-01-24 (×4): qty 1
  Filled 2015-01-24: qty 2
  Filled 2015-01-24 (×3): qty 1

## 2015-01-24 MED ORDER — POLYVINYL ALCOHOL 1.4 % OP SOLN
2.0000 [drp] | OPHTHALMIC | Status: DC | PRN
  Administered 2015-01-25 – 2015-01-26 (×9): 2 [drp] via OPHTHALMIC
  Filled 2015-01-24: qty 15

## 2015-01-24 MED ORDER — GABAPENTIN 400 MG PO CAPS
400.0000 mg | ORAL_CAPSULE | Freq: Two times a day (BID) | ORAL | Status: DC
  Administered 2015-01-24 – 2015-01-25 (×2): 400 mg via ORAL
  Filled 2015-01-24 (×5): qty 1

## 2015-01-24 MED ORDER — VITAMIN B-1 100 MG PO TABS
100.0000 mg | ORAL_TABLET | Freq: Every day | ORAL | Status: DC
  Administered 2015-01-25 – 2015-01-28 (×4): 100 mg via ORAL
  Filled 2015-01-24 (×5): qty 1

## 2015-01-24 MED ORDER — HYDROCHLOROTHIAZIDE 12.5 MG PO CAPS
12.5000 mg | ORAL_CAPSULE | Freq: Every day | ORAL | Status: DC
  Administered 2015-01-24 – 2015-01-28 (×5): 12.5 mg via ORAL
  Filled 2015-01-24 (×7): qty 1

## 2015-01-24 MED ORDER — CARVEDILOL 6.25 MG PO TABS
6.2500 mg | ORAL_TABLET | Freq: Two times a day (BID) | ORAL | Status: DC
  Administered 2015-01-24 – 2015-01-28 (×7): 6.25 mg via ORAL
  Filled 2015-01-24 (×11): qty 1
  Filled 2015-01-24: qty 2
  Filled 2015-01-24: qty 1

## 2015-01-24 MED ORDER — ALUM & MAG HYDROXIDE-SIMETH 200-200-20 MG/5ML PO SUSP: 30.0000 mL | ORAL | Status: DC | PRN

## 2015-01-24 MED ORDER — HYDROXYZINE HCL 25 MG PO TABS: 25.0000 mg | ORAL_TABLET | Freq: Four times a day (QID) | ORAL | Status: DC | PRN

## 2015-01-24 MED ORDER — TRAZODONE HCL 100 MG PO TABS
100.0000 mg | ORAL_TABLET | Freq: Every day | ORAL | Status: DC
  Administered 2015-01-24: 100 mg via ORAL
  Filled 2015-01-24 (×3): qty 1

## 2015-01-24 MED ORDER — ONDANSETRON 4 MG PO TBDP
4.0000 mg | ORAL_TABLET | Freq: Four times a day (QID) | ORAL | Status: DC | PRN
  Administered 2015-01-24 – 2015-01-27 (×7): 4 mg via ORAL
  Filled 2015-01-24 (×7): qty 1

## 2015-01-24 MED ORDER — MAGNESIUM HYDROXIDE 400 MG/5ML PO SUSP: 30.0000 mL | Freq: Every day | ORAL | Status: DC | PRN

## 2015-01-24 MED ORDER — CLONIDINE HCL 0.2 MG PO TABS: 0.2000 mg | ORAL_TABLET | Freq: Two times a day (BID) | ORAL | Status: DC

## 2015-01-24 MED ORDER — ACETAMINOPHEN 325 MG PO TABS
650.0000 mg | ORAL_TABLET | Freq: Four times a day (QID) | ORAL | Status: DC | PRN
  Administered 2015-01-26: 650 mg via ORAL
  Filled 2015-01-24: qty 2

## 2015-01-24 MED ORDER — AMLODIPINE BESYLATE 10 MG PO TABS
10.0000 mg | ORAL_TABLET | Freq: Every day | ORAL | Status: DC
  Administered 2015-01-24 – 2015-01-28 (×5): 10 mg via ORAL
  Filled 2015-01-24: qty 2
  Filled 2015-01-24 (×7): qty 1

## 2015-01-24 NOTE — ED Notes (Signed)
Per pt, states he has been using drugs and ETOh to cope-states he is suicidal-[facility]states he has bed and will be going back-her fir medical clearance

## 2015-01-24 NOTE — Tx Team (Signed)
Initial Interdisciplinary Treatment Plan   PATIENT STRESSORS: Health problems Substance abuse   PATIENT STRENGTHS: Ability for insight Average or above average intelligence Capable of independent living Motivation for treatment/growth Supportive family/friends   PROBLEM LIST: Problem List/Patient Goals Date to be addressed Date deferred Reason deferred Estimated date of resolution  Polysubstance abuse 01/24/2015     Depression 01/24/2015     Suicidal Ideation 01/24/2015                                          DISCHARGE CRITERIA:  Improved stabilization in mood, thinking, and/or behavior Motivation to continue treatment in a less acute level of care Need for constant or close observation no longer present Withdrawal symptoms are absent or subacute and managed without 24-hour nursing intervention  PRELIMINARY DISCHARGE PLAN: Attend 12-step recovery group Return to previous living arrangement  PATIENT/FAMIILY INVOLVEMENT: This treatment plan has been presented to and reviewed with the patient, Adrian Alexander.  The patient and family have been given the opportunity to ask questions and make suggestions.  Adrian Alexander, Adrian Alexander 01/24/2015, 5:32 PM

## 2015-01-24 NOTE — Progress Notes (Signed)
Pt attended evening AA group. 

## 2015-01-24 NOTE — ED Provider Notes (Signed)
CSN: [id number]     Arrival date & time 01/24/15  1120 History   First MD Initiated Contact with Patient 01/24/15 1300     Chief Complaint  Patient presents with  . Medical Clearance     (Consider location/radiation/quality/duration/timing/severity/associated sxs/prior Treatment) HPI 54 year old male with history of schizophrenia, bipolar disorder, polysubstance abuse who presents for medical clearance. Says that he presented to behavioral health unit today for concern of substance addiction, requesting detox there and having thoughts of suicidal ideations. Denies plan for suicide, and denies any homicidal thoughts or plans. Denies any hallucinations, tremors, or symptoms of withdrawal. States that he has had alcohol withdrawal in the past, but currently does not feel that he is. Last use of alcohol was this morning at 7 AM, and reports drinking beer. Last used cocaine yesterday evening. Sent to the ED for medical clearance. Past Medical History  Diagnosis Date  . Bipolar affective disorder, depressed (HCC)   . Schizophrenia, schizo-affective (HCC)   . Anxiety   . Seizures (HCC)     Last month. gets a sensation  . Hypertension   . Seizure (HCC)   . Schizo-affective schizophrenia [facility])    Past Surgical History  Procedure Laterality Date  . Closed reduction with humer pin insertion      left ankle 1998  . Hernia repair      while in prison  . Ankle surgery    . Hernia repair     No family history on file. Social History  Substance Use Topics  . Smoking status: Current Every Day Smoker -- 0.10 packs/day for 20 years    Types: Cigarettes  . Smokeless tobacco: Current User    Types: Snuff  . Alcohol Use: No     Comment: 6pk/day    Review of Systems 10/14 systems reviewed and are negative other than those stated in the HPI    Allergies  Review of patient's allergies indicates no known allergies.  Home Medications   Prior to Admission medications   Medication Sig Start  Date End Date Taking? Authorizing Provider  amLODipine (NORVASC) 10 MG tablet Take 10 mg by mouth daily.   Yes Historical Provider, MD  carvedilol (COREG) 6.25 MG tablet Take 6.25 mg by mouth 2 (two) times daily with a meal.   Yes Historical Provider, MD  Cholecalciferol (VITAMIN D) 2000 UNITS tablet Take 2,000 Units by mouth daily.   Yes Historical Provider, MD  cloNIDine (CATAPRES) 0.3 MG tablet Take 0.3 mg by mouth 2 (two) times daily.   Yes Historical Provider, MD  gabapentin (NEURONTIN) 400 MG capsule Take 400 mg by mouth 2 (two) times daily.   Yes Historical Provider, MD  hydrochlorothiazide (MICROZIDE) 12.5 MG capsule Take 12.5 mg by mouth daily.   Yes Historical Provider, MD  lisinopril (PRINIVIL,ZESTRIL) 40 MG tablet Take 40 mg by mouth daily.   Yes Historical Provider, MD  Multiple Vitamin (MULTIVITAMIN WITH MINERALS) TABS tablet Take 1 tablet by mouth daily.   Yes Historical Provider, MD  naproxen (NAPROSYN) 500 MG tablet Take 1 tablet (500 mg total) by mouth 2 (two) times daily. Patient taking differently: Take 500 mg by mouth 2 (two) times daily as needed for mild pain.  07/13/12  Yes Lisette Paz, PA-C  phenytoin (DILANTIN) 100 MG ER capsule Take 300 mg by mouth 2 (two) times daily.   Yes Historical Provider, MD  QUEtiapine (SEROQUEL) 400 MG tablet Take 400 mg by mouth at bedtime.   Yes Historical Provider, MD  traZODone (  DESYREL) 100 MG tablet Take 100 mg by mouth at bedtime.   Yes Historical Provider, MD  benztropine (COGENTIN) 0.5 MG tablet Take 1 tablet (0.5 mg total) by mouth 2 (two) times daily. For EPS. Patient not taking: Reported on 01/24/2015 09/10/11   Tamala JulianNeil T Mashburn, PA-C  cloNIDine (CATAPRES) 0.2 MG tablet Take 1 tablet (0.2 mg total) by mouth 2 (two) times daily. For hypertension. Patient not taking: Reported on 01/24/2015 09/10/11   Rona RavensNeil T Mashburn, PA-C  diazepam (VALIUM) 5 MG tablet Take 1 tablet (5 mg total) by mouth every 6 (six) hours as needed for anxiety. Patient not  taking: Reported on 01/24/2015 07/13/12   Jaci CarrelLisette Paz, PA-C  divalproex (DEPAKOTE) 250 MG DR tablet Take 1 tablet (250 mg total) by mouth 3 (three) times daily. For mood stabilization. Patient not taking: Reported on 01/24/2015 09/10/11   Tamala JulianNeil T Mashburn, PA-C  gabapentin (NEURONTIN) 300 MG capsule Take 1 capsule (300 mg total) by mouth 3 (three) times daily. For anxiety and pain. Patient not taking: Reported on 01/24/2015 09/10/11   Rona RavensNeil T Mashburn, PA-C   BP 148/98 mmHg  Pulse 74  Temp(Src) 98.2 F (36.8 C) (Oral)  Resp 18  SpO2 100% Physical Exam Physical Exam  Nursing note and vitals reviewed. Constitutional: Well developed, well nourished, non-toxic, and in no acute distress Head: Normocephalic and atraumatic.  Mouth/Throat: Oropharynx is clear and moist.  Neck: Normal range of motion. Neck supple.  Cardiovascular: Normal rate and regular rhythm.   Pulmonary/Chest: Effort normal and breath sounds normal.  Abdominal: Soft. There is no tenderness. There is no rebound and no guarding.  Musculoskeletal: Normal range of motion.  Neurological: Alert, no facial droop, fluent speech, moves all extremities symmetrically Skin: Skin is warm and dry.  Psychiatric: Cooperative  ED Course  Procedures (including critical care time) Labs Review Labs Reviewed  ETHANOL - Abnormal; Notable for the following:    Alcohol, Ethyl (B) 98 (*)    All other components within normal limits  CBC - Abnormal; Notable for the following:    RBC 6.16 (*)    All other components within normal limits  URINE RAPID DRUG SCREEN, HOSP PERFORMED - Abnormal; Notable for the following:    Tetrahydrocannabinol POSITIVE (*)    All other components within normal limits  COMPREHENSIVE METABOLIC PANEL   I have personally reviewed and evaluated these images and lab results as part of my medical decision-making.   MDM   Final diagnoses:  Medical clearance for psychiatric admission   Patient is well appearing and in  no acute distress. Vital signs within normal limits. No major signs/symptoms of intoxication and no evidence of withdrawal. EtOH level in 90s and UDS positive for THC. No electrolyte or metabolic derangements. Medically cleared for psychiatric evaluation. [facility]     Lavera Guiseana Duo Saralynn Langhorst, MD 01/24/15 [phone]

## 2015-01-24 NOTE — Progress Notes (Signed)
Admission note:  Patient is a 54 yo male with hx of schizophrenia, bipolar disorder and polysubstance abuse.  [facility] and reported SI.  Patient had no specific plan; denies HI/AVH.  Patient denies any withdrawal symptoms.  His last drink was 0700 in which patient drank 2 beers.  Patient also states that he used 1 gram of cocaine and smoked 3 blunts over the weekend.  He reports drinking a 12-pack of beer daily.  His BAL was 98.  He was positive for THC.  He states alcohol is his main drug of choice.  He reports depressive symptoms as insomnia, crying spells, listlessness and extreme sadness.  Patient does not work; he currently draws disability due his mental health status.  Patient denies HI/AVH.  Patient has a medical hx of HTN and seizure d/o. Patient oriented to room and unit.

## 2015-01-24 NOTE — ED Notes (Signed)
[facility] called 

## 2015-01-24 NOTE — Discharge Instructions (Signed)
You are medically cleared for admission at the behavior health unit.
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
## 2015-01-25 ENCOUNTER — Encounter (HOSPITAL_COMMUNITY): Payer: Self-pay | Admitting: Psychiatry

## 2015-01-25 DIAGNOSIS — F1414 Cocaine abuse with cocaine-induced mood disorder: Secondary | ICD-10-CM | POA: Diagnosis present

## 2015-01-25 DIAGNOSIS — F4310 Post-traumatic stress disorder, unspecified: Secondary | ICD-10-CM

## 2015-01-25 DIAGNOSIS — F259 Schizoaffective disorder, unspecified: Secondary | ICD-10-CM | POA: Diagnosis present

## 2015-01-25 DIAGNOSIS — F10230 Alcohol dependence with withdrawal, uncomplicated: Secondary | ICD-10-CM | POA: Diagnosis present

## 2015-01-25 DIAGNOSIS — F251 Schizoaffective disorder, depressive type: Secondary | ICD-10-CM

## 2015-01-25 DIAGNOSIS — F1994 Other psychoactive substance use, unspecified with psychoactive substance-induced mood disorder: Secondary | ICD-10-CM

## 2015-01-25 LAB — PHENYTOIN LEVEL, TOTAL: Phenytoin Lvl: 2.9 ug/mL — ABNORMAL LOW (ref 10.0–20.0)

## 2015-01-25 MED ORDER — TRAZODONE HCL 100 MG PO TABS
100.0000 mg | ORAL_TABLET | Freq: Every evening | ORAL | Status: DC | PRN
  Administered 2015-01-25 – 2015-01-27 (×3): 100 mg via ORAL
  Filled 2015-01-25 (×3): qty 1

## 2015-01-25 MED ORDER — QUETIAPINE FUMARATE 200 MG PO TABS
200.0000 mg | ORAL_TABLET | Freq: Every day | ORAL | Status: DC
  Administered 2015-01-25 – 2015-01-27 (×3): 200 mg via ORAL
  Filled 2015-01-25 (×4): qty 1

## 2015-01-25 MED ORDER — GABAPENTIN 400 MG PO CAPS
400.0000 mg | ORAL_CAPSULE | Freq: Three times a day (TID) | ORAL | Status: DC
  Administered 2015-01-25 – 2015-01-28 (×8): 400 mg via ORAL
  Filled 2015-01-25 (×12): qty 1

## 2015-01-25 MED ORDER — NICOTINE 21 MG/24HR TD PT24
21.0000 mg | MEDICATED_PATCH | Freq: Every day | TRANSDERMAL | Status: DC
  Administered 2015-01-25 – 2015-01-27 (×3): 21 mg via TRANSDERMAL
  Filled 2015-01-25 (×4): qty 1
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
## 2015-01-25 NOTE — Clinical Social Work Note (Signed)
Per pt request, [facility] referral faxed to [facility] [address] at [phone].  Trula SladeHeather Smart, MSW, LCSW Clinical Social Worker 01/25/2015 11:34 AM

## 2015-01-25 NOTE — Progress Notes (Signed)
D:  Patient's self inventory sheet, patient has fair sleep, sleep medication is helpful.  Poor appetite, low energy level, poor concentration.  Rated depression and hopeless 8, anxiety 10.  Withdrawals, chilling, agitation, nausea, irritability.  Denied SI.  Physical problems, lightheaded, pain, dizzy, headaches, worst pain #10 in past 24 hours, right eye.  Goal is for eye to improve, plans to talk to staff/MD.  No discharge plans. A:  Medications administered per MD orders.  Emotional support and encouragement given patient. R:  Denied SI and HI.  Denied A/V hallucinations.  Safety maintained with 15 minute checks. Patient did not think he needed the flu shot today.  Will discuss with MD.
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
## 2015-01-25 NOTE — BHH Group Notes (Signed)
BHH LCSW Group Therapy  01/25/2015 1:08 PM  Type of Therapy:  Group Therapy  Participation Level:  Active  Participation Quality:  Attentive  Affect:  Appropriate  Cognitive:  Alert and Oriented  Insight:  Engaged  Engagement in Therapy:  Improving  Modes of Intervention:  Confrontation, Discussion, Education, Exploration, Problem-solving, Rapport Building, Socialization and Support  Summary of Progress/Problems:  MHA Speaker absent today. CSW provided pts with Mental Health Association pamphlet and discussed services provided. The pt processed ways by which to relate to the speaker. MHA speaker provided handouts and educational information pertaining to groups and services offered by the [facility]. Adrian Alexander was attentive and engaged during today's processing group. He was receptive to all information provided and shared his personal experiences with [employer] and the [facility]. Adrian Alexander continues to demonstrate progress in the group setting and improving insight.   Smart, Parissa Chiao LCSW 01/25/2015, 1:08 PM

## 2015-01-25 NOTE — Progress Notes (Signed)
Recreation Therapy Notes  Animal-Assisted Activity (AAA) Program Checklist/Progress Notes Patient Eligibility Criteria Checklist & Daily Group note for Rec Tx Intervention  Date: 11.08.2016 Time: 2:45pm Location: [address] Dayroom    AAA/T Program Assumption of Risk Form signed by Patient/ or Parent Legal Guardian yes  Patient is free of allergies or sever asthma yes  Patient reports no fear of animals yes  Patient reports no history of cruelty to animals yes  Patient understands his/her participation is voluntary yes  Patient washes hands before animal contact yes  Patient washes hands after animal contact yes  Behavioral Response: Appropriate   Education: Hand Washing, Appropriate Animal Interaction   Education Outcome: Acknowledges education.   Clinical Observations/Feedback: Patient engaged appropriately with therapy dog and peers during session.    Gabriana Wilmott L Zyann Mabry, LRT/CTRS        Yaw Escoto L 01/25/2015 3:11 PM 

## 2015-01-25 NOTE — B[facility] Suicide Risk Assessment (Signed)
[facility]   Nursing information obtained from:    Demographic factors:    Current Mental Status:    Loss Factors:    Historical Factors:    Risk Reduction Factors:    Total Time spent with patient: 45 minutes Principal Problem: Substance induced mood disorder (HCC) Diagnosis:   Patient Active Problem List   Diagnosis Date Noted  . Alcohol dependence with withdrawal, uncomplicated (HCC) [F10.230] 01/25/2015  . Post traumatic stress disorder (PTSD) [F43.10] 01/25/2015  . Schizoaffective disorder (HCC) [F25.9] 01/25/2015  . Cocaine abuse with cocaine-induced mood disorder (HCC) [F14.14] 01/25/2015  . MDD (major depressive disorder) (HCC) [F32.9] 01/24/2015  . Substance induced mood disorder [facility](HCC) [F19.94] 09/10/2011     Continued Clinical Symptoms:  Alcohol Use Disorder Identification Test Final Score (AUDIT): 36 The "Alcohol Use Disorders Identification Test", Guidelines for Use in Primary Care, Second Edition.  World Science writerHealth Organization [facility](WHO). Score between 0-7:  no or low risk or alcohol related problems. Score between 8-15:  moderate risk of alcohol related problems. Score between 16-19:  high risk of alcohol related problems. Score 20 or above:  warrants further diagnostic evaluation for alcohol dependence and treatment.   CLINICAL FACTORS:   Alcohol/Substance Abuse/Dependencies  Psychiatric Specialty Exam: Physical Exam  ROS  Blood pressure 92/67, pulse 62, temperature 97.5 F (36.4 C), temperature source Oral, resp. rate 16, height 6\' 1"  (1.854 m), weight 90.719 kg (200 lb).Body mass index is 26.39 kg/(m^2).    COGNITIVE FEATURES THAT CONTRIBUTE TO RISK:  Closed-mindedness, Polarized thinking and Thought constriction (tunnel vision)    SUICIDE RISK:   Moderate:  Frequent suicidal ideation with limited intensity, and duration, some specificity in terms of plans, no associated intent, good self-control, limited dysphoria/symptomatology, some  risk factors present, and identifiable protective factors, including available and accessible social support.  PLAN OF CARE: see admission H and P  Medical Decision Making:  Review of Psycho-Social Stressors (1), Review or order clinical lab tests (1), Review of Medication Regimen & Side Effects (2) and Review of New Medication or Change in Dosage (2)  I certify that inpatient services furnished can reasonably be expected to improve the patient's condition.   Adrian Alexander 01/25/2015, 5:00 PM

## 2015-01-25 NOTE — Progress Notes (Signed)
NUTRITION ASSESSMENT  Pt identified as at risk on the Malnutrition Screen Tool  INTERVENTION: 1. Educated patient on the importance of nutrition and encouraged intake of food and beverages. 2. Discussed weight goals. 3. Supplements: continue Ensure Enlive BID, each supplement provides 350 kcal and 20 grams of protein  NUTRITION DIAGNOSIS: Unintentional weight loss related to sub-optimal intake as evidenced by pt report.   Goal: Pt to meet >/= 90% of their estimated nutrition needs.  Monitor:  PO intake  Assessment:  Pt seen for MST. Pt admitted for severe anxiety and depression. He has a hx of schizophrenia, bipolar disorder, and polysubstance abuse and was admitted for alcohol detox and SI. Limited weight hx available. Ensure Shiela Mayernlive has already been ordered BID.  54 y.o. male  Height: Ht Readings from Last 1 Encounters:  01/24/15 6\' 1"  (1.854 m)    Weight: Wt Readings from Last 1 Encounters:  01/24/15 200 lb (90.719 kg)    Weight Hx: Wt Readings from Last 10 Encounters:  01/24/15 200 lb (90.719 kg)  07/13/12 231 lb (104.781 kg)  10/11/11 240 lb (108.863 kg)  09/04/11 209 lb (94.802 kg)  09/02/11 215 lb (97.523 kg)    BMI:  Body mass index is 26.39 kg/(m^2). Pt meets criteria for overweight based on current BMI.  Estimated Nutritional Needs: Kcal: 25-30 kcal/kg Protein: > 1 gram protein/kg Fluid: 1 ml/kcal  Diet Order: Diet regular Room service appropriate?: Yes; Fluid consistency:: Thin Pt is also offered choice of unit snacks mid-morning and mid-afternoon.  Pt is eating as desired.   Lab results and medications reviewed.      Trenton GammonJessica Trish Mancinelli, RD, LDN Inpatient Clinical Dietitian Pager # [phone] After hours/weekend pager # [phone]
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
## 2015-01-25 NOTE — BHH Group Notes (Addendum)

## 2015-01-25 NOTE — BHH Suicide Risk Assessment (Signed)
BHH INPATIENT:  Family/Significant Other Suicide Prevention Education  Suicide Prevention Education:  Contact Attempts: Landis MartinsMonica Hemingway (fiance) [phone] has been identified by the patient as the family member/significant other with whom the patient will be residing, and identified as the person(s) who will aid the patient in the event of a mental health crisis.  With written consent from the patient, two attempts were made to provide suicide prevention education, prior to and/or following the patient's discharge.  We were unsuccessful in providing suicide prevention education.  A suicide education pamphlet was given to the patient to share with family/significant other.  Date and time of first attempt: 01/25/15 at 2;10PM (message left for [facility] requesting call back at her earliest convenience).   Smart, Demaya Hardge LCSW 01/25/2015, 2:12 PM    CSW spoke with fiance and completed SPE/discussed aftercare plan.   Trula SladeHeather Smart, MSW, LCSW Clinical Social Worker 01/26/2015 12:02 PM

## 2015-01-25 NOTE — Progress Notes (Signed)
Pt attended evening AA group. 

## 2015-01-26 MED ORDER — POLYMYXIN B-TRIMETHOPRIM 10000-0.1 UNIT/ML-% OP SOLN
2.0000 [drp] | OPHTHALMIC | Status: DC
  Administered 2015-01-26 – 2015-01-28 (×5): 2 [drp] via OPHTHALMIC
  Filled 2015-01-26: qty 10
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
## 2015-01-26 NOTE — Progress Notes (Signed)
Pt was very open in wrap-up group about not being able to read or write. He also talked about his 8516 year jail term. He also stated that he is happy about the person he is becoming.

## 2015-01-26 NOTE — [facility] Group Notes (Signed)
[facility]   01/26/2015 11:39 AM  Participation Quality:  DID NOT ATTEND-pt meeting with RN regarding eye infection-excused from group.   Smart, Hareem Surowiec LCSW

## 2015-01-26 NOTE — Progress Notes (Addendum)
D: Pt presents sad in affect and depressed in mood. Pt presents with a CIWA level of 2 for anxiety and mild tremors. Pt is currently negative for any SI/HI/AV. Pt continues to verbalize experience in [facility]. Pt endorses an overall improvement in his mood.  A: Writer administered scheduled and prn medications to pt, per MD orders. Continued support and availability as needed was extended to this pt. Staff continue to monitor pt with q215min checks.  R: No adverse drug reactions noted. Pt receptive to treatment. Pt remains safe at this time.

## 2015-01-26 NOTE — Progress Notes (Signed)
Recreation Therapy Notes  Date: 01/26/15 Time: 930 Location: 300 Hall Dayroom  Group Topic: Stress Management  Goal Area(s) Addresses:  Patient will verbalize importance of using healthy stress management.  Patient will identify positive emotions associated with healthy stress management.   Intervention: Stress Management  Activity :  Progressive Muscle Relaxation.  LRT introduced and educated patients on stress management technique of progressive muscle relaxation.  A script was used to deliver the technique to patients and patients were asked to follow script read allowed by LRT to engage in practicing the stress management technique.    Education:  Stress Management, Discharge Planning.   Education Outcome: Acknowledges edcuation/In group clarification offered/Needs additional education  Clinical Observations/Feedback:  Patient did not attend group.    Caroll RancherMarjette Hairo Garraway, LRT/CTRS         Caroll RancherLindsay, Kaylem Gidney A 01/26/2015 4:13 PM

## 2015-01-26 NOTE — [facility] Group Notes (Signed)
[facility] LCSW Group Therapy  01/26/2015 12:44 PM  Type of Therapy:  Group Therapy  Participation Level:  Active  Participation Quality:  Attentive  Affect:  Appropriate  Cognitive:  Alert and Oriented  Insight:  Engaged  Engagement in Therapy:  Engaged  Modes of Intervention:  Confrontation, Discussion, Education, Exploration, Problem-solving, Rapport Building, Socialization and Support  Summary of Progress/Problems: Today's Topic: Overcoming Obstacles. Patients identified one short term goal and potential obstacles in reaching this goal. Patients processed barriers involved in overcoming these obstacles. Patients identified steps necessary for overcoming these obstacles and explored motivation (internal and external) for facing these difficulties head on. Adrian Alexander was attentive and engaged during today's processing group. He shared that his goal was to get himself "right for myself and my girlfriend." Adrian Alexander continues to show progress in the group setting and improving insight. He states that due to court date, [facility] but [facility] groups.    Smart, Nichola Cieslinski LCSW 01/26/2015, 12:44 PM
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
## 2015-01-26 NOTE — Progress Notes (Signed)
D. D: Pt continues to be very flat and depressed on the unit today. Pt also continues to be very isolative and complained of eyes being pain " I think I am going blind I need to go to the ED"  Eyes drops administered as ordred. Per self inventory, pt had a poor sleep, fair appetite, low energy and poor concentration. Pt goal for today is "get my eyes focused, focus in groups." Pt reported that his depression was a 8, his hopelessness was a 09, and that his anxiety was a 09. Pt reported being negative SI/HI, no AH/VH noted. A: 15 min checks continued for patient safety. R: Pts safety maintained.

## 2015-01-27 ENCOUNTER — Inpatient Hospital Stay (HOSPITAL_COMMUNITY): Payer: Medicaid Other

## 2015-01-27 ENCOUNTER — Encounter (HOSPITAL_COMMUNITY): Payer: Self-pay | Admitting: Emergency Medicine

## 2015-01-27 MED ORDER — QUETIAPINE FUMARATE 200 MG PO TABS: 200.0000 mg | ORAL_TABLET | Freq: Every day | ORAL | Status: DC

## 2015-01-27 MED ORDER — ONDANSETRON 4 MG PO TBDP
4.0000 mg | ORAL_TABLET | Freq: Once | ORAL | Status: AC
  Administered 2015-01-27: 4 mg via ORAL
  Filled 2015-01-27: qty 1

## 2015-01-27 MED ORDER — HYDROCHLOROTHIAZIDE 12.5 MG PO CAPS: 12.5000 mg | ORAL_CAPSULE | Freq: Every day | ORAL | Status: DC

## 2015-01-27 MED ORDER — GABAPENTIN 400 MG PO CAPS: 400.0000 mg | ORAL_CAPSULE | Freq: Three times a day (TID) | ORAL | Status: DC

## 2015-01-27 MED ORDER — AMLODIPINE BESYLATE 10 MG PO TABS: 10.0000 mg | ORAL_TABLET | Freq: Every day | ORAL | Status: DC

## 2015-01-27 MED ORDER — FLUORESCEIN SODIUM 1 MG OP STRP
1.0000 | ORAL_STRIP | Freq: Once | OPHTHALMIC | Status: AC
  Administered 2015-01-27: 1 via OPHTHALMIC
  Filled 2015-01-27: qty 1

## 2015-01-27 MED ORDER — ADULT MULTIVITAMIN W/MINERALS CH: 1.0000 | ORAL_TABLET | Freq: Every day | ORAL | Status: AC

## 2015-01-27 MED ORDER — TETRACAINE HCL 0.5 % OP SOLN
2.0000 [drp] | Freq: Once | OPHTHALMIC | Status: AC
  Administered 2015-01-27: 2 [drp] via OPHTHALMIC
  Filled 2015-01-27: qty 2

## 2015-01-27 MED ORDER — CLONIDINE HCL 0.3 MG PO TABS: 0.3000 mg | ORAL_TABLET | Freq: Two times a day (BID) | ORAL | Status: DC

## 2015-01-27 MED ORDER — VITAMIN D3 25 MCG (1000 UNIT) PO TABS: 2000.0000 [IU] | ORAL_TABLET | Freq: Every day | ORAL | Status: AC
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
## 2015-01-27 NOTE — Progress Notes (Signed)
D: Pt presents with a lack of energy this evening. Pt was minimal in interaction. Pt reported that he felt like a seizure was coming as he was kneeling in a chair. Pt reports that he has "fainting seizures" and that he usually sits in the stated position for safety. Writer sat with the pt for 5 minutes in the dayroom for safety. Pt is currently denying any suicidal ideation. Pt verbally contracts for safety. Pt participates within the milieu.  A: Writer administered scheduled and prn medications to pt, per MD orders. Continued support and availability as needed was extended to this pt. Staff continue to monitor pt with q2015min checks.  R: No adverse drug reactions noted. Pt receptive to treatment. Pt remains safe at this time.
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
## 2015-01-27 NOTE — Progress Notes (Signed)
[facility] Dub MikesLugo and FNP notified of pt return and discharge summary recommendations. Will continue to monitor.

## 2015-01-27 NOTE — Plan of Care (Signed)
Problem: Alteration in mood Goal: LTG-Patient reports reduction in suicidal thoughts (Patient reports reduction in suicidal thoughts and is able to verbalize a safety plan for whenever patient is feeling suicidal)  Outcome: Progressing Pt denies any SI. Pt verbally contracts for safety.      

## 2015-01-27 NOTE — Progress Notes (Signed)
D: Pt presents appropriate in affect and pleasant in mood. Pt's eyes present with less drainage and redness as writer compares to previous a.m. assessment on 11/10. Pt previously had difficulty with keeping his right eye opened (and eventually his left). Pt verbalizes readiness for discharge. Pt is requesting to be discharged after breakfast. Pt plans to use the bus as his mode of transportation. Pt is denying any suicidal ideation at this time.  A: Writer administered scheduled and prn medications to pt, per MD orders. Continued support and availability as needed was extended to this pt. Staff continues to monitor pt with q2515min checks.  R: No adverse drug reactions noted. Pt receptive to treatment. Pt remains safe at this time.

## 2015-01-27 NOTE — Plan of Care (Signed)
Problem: Diagnosis: Increased Risk For Suicide Attempt Goal: LTG-Patient Will Report Absence of Withdrawal Symptoms LTG (by discharge): Patient will report absence of withdrawal symptoms.  Outcome: Progressing Pt denies any current withdrawal symptoms.

## 2015-01-27 NOTE — Discharge Instructions (Signed)
Your CT scan looks normal today. There is no sign of fracture to the bones around your eye or any problems within your eyes themselves. Please follow-up with Dr. Clelia CroftShaw after you are discharged from the hospital. Your symptoms should resolve on their own with time.  Please obtain all of your results from medical records or have your doctors office obtain the results - share them with your doctor - you should be seen at your doctors office in the next 2 days. Call today to arrange your follow up. Take the medications as prescribed. Please review all of the medicines and only take them if you do not have an allergy to them. Please be aware that if you are taking birth control pills, taking other prescriptions, ESPECIALLY ANTIBIOTICS may make the birth control ineffective - if this is the case, either do not engage in sexual activity or use alternative methods of birth control such as condoms until you have finished the medicine and your family doctor says it is OK to restart them. If you are on a blood thinner such as COUMADIN, be aware that any other medicine that you take may cause the coumadin to either work too much, or not enough - you should have your coumadin level rechecked in next 7 days if this is the case.  ?  It is also a possibility that you have an allergic reaction to any of the medicines that you have been prescribed - Everybody reacts differently to medications and while MOST people have no trouble with most medicines, you may have a reaction such as nausea, vomiting, rash, swelling, shortness of breath. If this is the case, please stop taking the medicine immediately and contact your physician.  ?  You should return to the ER if you develop severe or worsening symptoms.

## 2015-01-27 NOTE — ED Notes (Signed)
Per pt, states right eye infection and thinks it is gone to left eye-states he cant see
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
## 2015-01-27 NOTE — Progress Notes (Signed)
D:Per patient self inventory form pt reports he slept poor last night with the use of sleep medication. He reports a poor appetite, low energy level, poor concentration. He rates depression 7/10, hopelessness 9/10, anxiety 9/10- all on 0-10 scale, 10 being th worse. Pt denies SI/HI. Denies AVH. Reports eye pain 10/10. Reports his goal for the day is "eye checked out" and the "Dr." will help him meet his goal today.  A:Special checks q 15 mins in place for safety. Medication administered per MD order (see eMAR) Encouragement an support provided.  R:Safety maintained. Will continue to monitor.

## 2015-01-28 MED ORDER — CARVEDILOL 6.25 MG PO TABS: 6.2500 mg | ORAL_TABLET | Freq: Two times a day (BID) | ORAL | Status: DC

## 2015-01-28 NOTE — Progress Notes (Signed)
Recreation Therapy Notes  Date: 01/28/15 Time: 930 Location: 300 Group Room  Group Topic: Stress Management  Goal Area(s) Addresses:  Patient will verbalize importance of using healthy stress management.  Patient will identify positive emotions associated with healthy stress management.   Intervention: Stress Management  Activity :  Guided Imagery.  LRT introduced the concept of guided imagery to the patients.  Patients were asked to follow along as LRT read the script to engage in the activity of guided imagery.  Education:  Stress Management, Discharge Planning.   Education Outcome: Acknowledges edcuation/In group clarification offered/Needs additional education  Clinical Observations/Feedback: Patient did not attend group.   Caroll RancherMarjette Hope Holst, LRT/CTRS         Caroll RancherLindsay, Chesley Veasey A 01/28/2015 1:49 PM
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
## 2015-01-28 NOTE — Progress Notes (Signed)
Pt attended karaoke group this evening.  

## 2015-01-28 NOTE — Progress Notes (Signed)
Patient ID: Adrian Alexander, male   DOB: 05/28/1960, 54 y.o.   MRN: [id number] [facility]  AVS reviewed with pt.  Pt given opportunity to ask questions concerning discharge, and pt stated pt did not have questions about discharge.  Scripts for medicines given to pt.  Belongings returned to pt and belonging sheet signed.  Pt escorted to lobby.

## 2015-01-28 NOTE — Progress Notes (Signed)
  [facility] Discharge [facility] :  Will you be returning to the same living situation after discharge:  Yes,  home At discharge, do you have transportation home?: Yes,  taxi voucher? Do you have the ability to pay for your medications: Yes,  medicaid  Release of information consent forms completed and in the chart;  Patient's signature needed at discharge.  Patient to Follow up at: Follow-up Information    Follow up with [facility] (Medication Management) On 02/18/2015.   Why:  at 10:30am for medication management with [name]   Contact information:   ATTN: [name] [address] [place], [place] [address] Phone: [phone] Fax: [phone]      Follow up with [facility] (Counseling) On 02/09/2015.   Why:  at 11:00am for therapy with [name]   Contact information:   ATTN: Audrey [address] , [place] [address] Phone: [phone] Fax: [phone]      Schedule an appointment as soon as possible for a visit with [name], [name], MD.   Specialty:  Ophthalmology   Why:  when you are discharged   Contact information:   [address] [place] [address] [phone]       Follow up with [facility]   Specialty:  Emergency Medicine   Why:  If symptoms worsen   Contact information:   [address] [id number] [place] [id number] [phone]      Follow up with Ophthalmology. Schedule an appointment as soon as possible for a visit in 1 day.   Why:  Right eye Pain/ Blurred vision F/U   Contact information:   Kristin BruinsSHAW, STEVEN JAMES, MD [address]  [place] [address] [phone]      Next level of care provider has access to [facility] Link:yes  Patient denies SI/HI: Yes,  during group/self report.     Safety Planning and Suicide Prevention discussed: Yes,  SPE completed with pt's fiance  Have you used any form of  tobacco in the last 30 days? (Cigarettes, Smokeless Tobacco, Cigars, and/or Pipes): Yes  Has patient been referred to the Quitline?: Patient refused referral  Smart, [name] LCSW 01/28/2015, 8:15 AM

## 2015-02-02 ENCOUNTER — Encounter (HOSPITAL_COMMUNITY): Payer: Self-pay | Admitting: Cardiology

## 2015-02-02 ENCOUNTER — Emergency Department (HOSPITAL_COMMUNITY)
Admission: EM | Admit: 2015-02-02 | Discharge: 2015-02-02 | Disposition: A | Discharge status: Home / Self Care | Payer: Medicaid Other | Attending: Emergency Medicine | Admitting: Emergency Medicine

## 2015-02-02 DIAGNOSIS — F17210 Nicotine dependence, cigarettes, uncomplicated: Secondary | ICD-10-CM | POA: Insufficient documentation

## 2015-02-02 DIAGNOSIS — F419 Anxiety disorder, unspecified: Secondary | ICD-10-CM | POA: Insufficient documentation

## 2015-02-02 DIAGNOSIS — Z79899 Other long term (current) drug therapy: Secondary | ICD-10-CM | POA: Insufficient documentation

## 2015-02-02 DIAGNOSIS — H209 Unspecified iridocyclitis: Secondary | ICD-10-CM | POA: Diagnosis not present

## 2015-02-02 DIAGNOSIS — I10 Essential (primary) hypertension: Secondary | ICD-10-CM | POA: Diagnosis not present

## 2015-02-02 DIAGNOSIS — F209 Schizophrenia, unspecified: Secondary | ICD-10-CM | POA: Insufficient documentation

## 2015-02-02 DIAGNOSIS — F319 Bipolar disorder, unspecified: Secondary | ICD-10-CM | POA: Insufficient documentation

## 2015-02-02 DIAGNOSIS — H5711 Ocular pain, right eye: Secondary | ICD-10-CM | POA: Diagnosis present

## 2015-02-02 MED ORDER — HYDROCODONE-ACETAMINOPHEN 5-325 MG PO TABS: 1.0000 | ORAL_TABLET | Freq: Four times a day (QID) | ORAL | Status: DC | PRN

## 2015-02-02 MED ORDER — TETRACAINE HCL 0.5 % OP SOLN
2.0000 [drp] | Freq: Once | OPHTHALMIC | Status: AC
  Administered 2015-02-02: 2 [drp] via OPHTHALMIC
  Filled 2015-02-02: qty 2

## 2015-02-02 MED ORDER — FLUORESCEIN SODIUM 1 MG OP STRP
1.0000 | ORAL_STRIP | Freq: Once | OPHTHALMIC | Status: AC
  Administered 2015-02-02: 1 via OPHTHALMIC
  Filled 2015-02-02: qty 1
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
## 2015-02-02 NOTE — Discharge Instructions (Signed)
Call Dr. Clelia CroftShaw about scheduling an appointment to see him as soon as possible

## 2015-02-02 NOTE — ED Notes (Signed)
Provider at the bedside.  
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
## 2015-02-07 ENCOUNTER — Ambulatory Visit (INDEPENDENT_AMBULATORY_CARE_PROVIDER_SITE_OTHER): Payer: Medicaid Other | Admitting: Family Medicine

## 2015-02-07 ENCOUNTER — Encounter: Payer: Self-pay | Admitting: Family Medicine

## 2015-02-07 VITALS — BP 144/98 | HR 88 | Temp 98.3°F | Ht 73.0 in | Wt 208.0 lb

## 2015-02-07 DIAGNOSIS — Z7689 Persons encountering health services in other specified circumstances: Secondary | ICD-10-CM

## 2015-02-07 DIAGNOSIS — H209 Unspecified iridocyclitis: Secondary | ICD-10-CM | POA: Diagnosis not present

## 2015-02-07 DIAGNOSIS — I10 Essential (primary) hypertension: Secondary | ICD-10-CM | POA: Diagnosis not present

## 2015-02-07 DIAGNOSIS — Z0000 Encounter for general adult medical examination without abnormal findings: Secondary | ICD-10-CM

## 2015-02-07 DIAGNOSIS — Z7189 Other specified counseling: Secondary | ICD-10-CM | POA: Diagnosis not present

## 2015-02-07 LAB — COMPLETE METABOLIC PANEL WITH GFR
ALT: 30 U/L (ref 9–46)
AST: 22 U/L (ref 10–35)
Albumin: 4.2 g/dL (ref 3.6–5.1)
Alkaline Phosphatase: 43 U/L (ref 40–115)
BUN: 13 mg/dL (ref 7–25)
CO2: 24 mmol/L (ref 20–31)
Calcium: 9.3 mg/dL (ref 8.6–10.3)
Chloride: 106 mmol/L (ref 98–110)
Creat: 1.03 mg/dL (ref 0.70–1.33)
GFR, Est African American: >89 mL/min (ref >=60)
GFR, Est Non African American: 83 mL/min (ref >=60)
Glucose, Bld: 78 mg/dL (ref 65–99)
Potassium: 3.8 mmol/L (ref 3.5–5.3)
Sodium: 141 mmol/L (ref 135–146)
Total Bilirubin: 0.7 mg/dL (ref 0.2–1.2)
Total Protein: 7.3 g/dL (ref 6.1–8.1)

## 2015-02-07 LAB — CBC WITH DIFFERENTIAL/PLATELET
Basophils Absolute: 0 10*3/uL (ref 0.0–0.1)
Basophils Relative: 0 % (ref 0–1)
Eosinophils Absolute: 0 10*3/uL (ref 0.0–0.7)
Eosinophils Relative: 0 % (ref 0–5)
HCT: 45.9 % (ref 39.0–52.0)
Hemoglobin: 15.4 g/dL (ref 13.0–17.0)
Lymphocytes Relative: 29 % (ref 12–46)
Lymphs Abs: 1.4 10*3/uL (ref 0.7–4.0)
MCH: 26.3 pg (ref 26.0–34.0)
MCHC: 33.6 g/dL (ref 30.0–36.0)
MCV: 78.5 fL (ref 78.0–100.0)
MPV: 9.4 fL (ref 8.6–12.4)
Monocytes Absolute: 0.7 10*3/uL (ref 0.1–1.0)
Monocytes Relative: 14 % — ABNORMAL HIGH (ref 3–12)
Neutro Abs: 2.7 10*3/uL (ref 1.7–7.7)
Neutrophils Relative %: 57 % (ref 43–77)
Platelets: 289 10*3/uL (ref 150–400)
RBC: 5.85 MIL/uL — ABNORMAL HIGH (ref 4.22–5.81)
RDW: 14.6 % (ref 11.5–15.5)
WBC: 4.8 10*3/uL (ref 4.0–10.5)

## 2015-02-07 LAB — LIPID PANEL
Cholesterol: 149 mg/dL (ref 125–200)
HDL: 42 mg/dL (ref >=40)
LDL Cholesterol: 91 mg/dL (ref <130)
Total CHOL/HDL Ratio: 3.5 Ratio (ref <=5.0)
Triglycerides: 81 mg/dL (ref <150)
VLDL: 16 mg/dL (ref <30)

## 2015-02-07 MED ORDER — GABAPENTIN 400 MG PO CAPS: 400.0000 mg | ORAL_CAPSULE | Freq: Three times a day (TID) | ORAL | Status: DC

## 2015-02-07 MED ORDER — AMLODIPINE BESYLATE 10 MG PO TABS: 10.0000 mg | ORAL_TABLET | Freq: Every day | ORAL | Status: DC

## 2015-02-07 MED ORDER — CARVEDILOL 25 MG PO TABS: 25.0000 mg | ORAL_TABLET | Freq: Two times a day (BID) | ORAL | Status: DC

## 2015-02-07 MED ORDER — CLONIDINE HCL 0.3 MG PO TABS: 0.3000 mg | ORAL_TABLET | Freq: Two times a day (BID) | ORAL | Status: DC

## 2015-02-07 MED ORDER — HYDROCHLOROTHIAZIDE 12.5 MG PO CAPS: 12.5000 mg | ORAL_CAPSULE | Freq: Every day | ORAL | Status: DC
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
## 2015-02-08 LAB — PSA: PSA: 0.45 ng/mL (ref <=4.00)

## 2015-02-08 LAB — HIV ANTIBODY (ROUTINE TESTING W REFLEX): HIV 1&2 Ab, 4th Generation: NONREACTIVE

## 2015-02-08 LAB — TSH: TSH: 0.683 u[IU]/mL (ref 0.350–4.500)

## 2015-02-10 LAB — VITAMIN D 1,25 DIHYDROXY
Vitamin D 1, 25 (OH)2 Total: 92 pg/mL — ABNORMAL HIGH (ref 18–72)
Vitamin D2 1, 25 (OH)2: <8 pg/mL
Vitamin D3 1, 25 (OH)2: 92 pg/mL

## 2015-05-13 ENCOUNTER — Ambulatory Visit: Payer: Self-pay | Admitting: Family Medicine

## 2015-05-30 ENCOUNTER — Other Ambulatory Visit: Payer: Self-pay | Admitting: Family Medicine

## 2015-06-07 ENCOUNTER — Encounter (HOSPITAL_COMMUNITY): Payer: Self-pay | Admitting: *Deleted

## 2015-06-07 ENCOUNTER — Emergency Department (HOSPITAL_COMMUNITY): Payer: Medicaid Other

## 2015-06-07 ENCOUNTER — Emergency Department (HOSPITAL_COMMUNITY)
Admission: EM | Admit: 2015-06-07 | Discharge: 2015-06-07 | Disposition: A | Discharge status: Home / Self Care | Payer: Medicaid Other | Attending: Emergency Medicine | Admitting: Emergency Medicine

## 2015-06-07 DIAGNOSIS — F209 Schizophrenia, unspecified: Secondary | ICD-10-CM | POA: Insufficient documentation

## 2015-06-07 DIAGNOSIS — F319 Bipolar disorder, unspecified: Secondary | ICD-10-CM | POA: Insufficient documentation

## 2015-06-07 DIAGNOSIS — Z87891 Personal history of nicotine dependence: Secondary | ICD-10-CM | POA: Insufficient documentation

## 2015-06-07 DIAGNOSIS — Z79899 Other long term (current) drug therapy: Secondary | ICD-10-CM | POA: Diagnosis not present

## 2015-06-07 DIAGNOSIS — K6289 Other specified diseases of anus and rectum: Secondary | ICD-10-CM | POA: Diagnosis not present

## 2015-06-07 DIAGNOSIS — M545 Low back pain: Secondary | ICD-10-CM | POA: Insufficient documentation

## 2015-06-07 DIAGNOSIS — I10 Essential (primary) hypertension: Secondary | ICD-10-CM | POA: Diagnosis not present

## 2015-06-07 DIAGNOSIS — M533 Sacrococcygeal disorders, not elsewhere classified: Secondary | ICD-10-CM

## 2015-06-07 LAB — I-STAT CHEM 8, ED
BUN: 24 mg/dL — ABNORMAL HIGH (ref 6–20)
Calcium, Ion: 1.18 mmol/L (ref 1.12–1.23)
Chloride: 103 mmol/L (ref 101–111)
Creatinine, Ser: 0.9 mg/dL (ref 0.61–1.24)
Glucose, Bld: 96 mg/dL (ref 65–99)
HCT: 51 % (ref 39.0–52.0)
Hemoglobin: 17.3 g/dL — ABNORMAL HIGH (ref 13.0–17.0)
Potassium: 3.9 mmol/L (ref 3.5–5.1)
Sodium: 143 mmol/L (ref 135–145)
TCO2: 28 mmol/L (ref 0–100)

## 2015-06-07 LAB — CBC WITH DIFFERENTIAL/PLATELET
Basophils Absolute: 0 10*3/uL (ref 0.0–0.1)
Basophils Relative: 0 %
Eosinophils Absolute: 0.1 10*3/uL (ref 0.0–0.7)
Eosinophils Relative: 1 %
HCT: 46.6 % (ref 39.0–52.0)
Hemoglobin: 15.3 g/dL (ref 13.0–17.0)
Lymphocytes Relative: 28 %
Lymphs Abs: 1.2 10*3/uL (ref 0.7–4.0)
MCH: 26.2 pg (ref 26.0–34.0)
MCHC: 32.8 g/dL (ref 30.0–36.0)
MCV: 79.7 fL (ref 78.0–100.0)
Monocytes Absolute: 0.7 10*3/uL (ref 0.1–1.0)
Monocytes Relative: 16 %
Neutro Abs: 2.4 10*3/uL (ref 1.7–7.7)
Neutrophils Relative %: 55 %
Platelets: 246 10*3/uL (ref 150–400)
RBC: 5.85 MIL/uL — ABNORMAL HIGH (ref 4.22–5.81)
RDW: 13.6 % (ref 11.5–15.5)
WBC: 4.5 10*3/uL (ref 4.0–10.5)

## 2015-06-07 MED ORDER — IOHEXOL 300 MG/ML  SOLN
100.0000 mL | Freq: Once | INTRAMUSCULAR | Status: AC | PRN
  Administered 2015-06-07: 100 mL via INTRAVENOUS

## 2015-06-07 MED ORDER — CYCLOBENZAPRINE HCL 10 MG PO TABS: 10.0000 mg | ORAL_TABLET | Freq: Two times a day (BID) | ORAL | Status: DC | PRN

## 2015-06-07 MED ORDER — HYDROMORPHONE HCL 1 MG/ML IJ SOLN
0.5000 mg | Freq: Once | INTRAMUSCULAR | Status: AC
  Administered 2015-06-07: 0.5 mg via INTRAVENOUS
  Filled 2015-06-07: qty 1

## 2015-06-07 MED ORDER — IBUPROFEN 600 MG PO TABS: 600.0000 mg | ORAL_TABLET | Freq: Four times a day (QID) | ORAL | Status: DC | PRN

## 2015-06-07 MED ORDER — ONDANSETRON HCL 4 MG/2ML IJ SOLN
4.0000 mg | Freq: Once | INTRAMUSCULAR | Status: AC
  Administered 2015-06-07: 4 mg via INTRAVENOUS
  Filled 2015-06-07: qty 2

## 2015-06-07 MED ORDER — HYDROCODONE-ACETAMINOPHEN 5-325 MG PO TABS: 1.0000 | ORAL_TABLET | Freq: Four times a day (QID) | ORAL | Status: DC | PRN

## 2015-06-07 MED ORDER — MORPHINE SULFATE (PF) 4 MG/ML IV SOLN
4.0000 mg | Freq: Once | INTRAVENOUS | Status: AC
  Administered 2015-06-07: 4 mg via INTRAVENOUS
  Filled 2015-06-07: qty 1

## 2015-06-07 NOTE — ED Notes (Signed)
Pelvic pain and blood in stool per PA.   Move per PA request.

## 2015-06-07 NOTE — ED Provider Notes (Signed)
CSN: [id number]     Arrival date & time 06/07/15  [id number] History   First MD Initiated Contact with Patient 06/07/15 1046     Chief Complaint  Patient presents with  . Back Pain  . Hypertension     (Consider location/radiation/quality/duration/timing/severity/associated sxs/prior Treatment) HPI Philipp DeputyLinwood Luber is a 55 y.o. male with hx of schizophrenia, bipolar disorder, htn, seizures, presents to ED With complaint of sacrum and rectal pain. Patient states his pain started yesterday. He reports pain in the lower sacrum and around his tailbone radiating into his rectum. He states that sitting on that area and movement makes pain worse. Nothing makes it better. Patient states that he has painful bowel movements. He reports seeing some blood in his stool as well. He denies any fever or chills. No nausea or vomiting. No abdominal pain. Denies any numbness or tingling in his extremities. No trouble controlling his bowel or bladder. He reports history of similar pain in the past that he was told was a muscle strain.  Past Medical History  Diagnosis Date  . Bipolar affective disorder, depressed (HCC)   . Schizophrenia, schizo-affective (HCC)   . Anxiety   . Seizures (HCC)     Last month. gets a sensation  . Hypertension   . Seizure (HCC)   . Schizo-affective schizophrenia [facility](HCC)    Past Surgical History  Procedure Laterality Date  . Closed reduction with humer pin insertion      left ankle 1998  . Hernia repair      while in prison  . Ankle surgery    . Hernia repair     Family History  Problem Relation Age of Onset  . Cancer Mother 6450    colon  . Heart disease Father    Social History  Substance Use Topics  . Smoking status: Former Smoker -- 0.10 packs/day for 20 years    Types: Cigarettes  . Smokeless tobacco: Current User    Types: Snuff  . Alcohol Use: No     Comment: 6pk/day-  States not in last week 06/07/2015    Review of Systems  Constitutional: Negative for fever and  chills.  Respiratory: Negative for cough, chest tightness and shortness of breath.   Cardiovascular: Negative for chest pain, palpitations and leg swelling.  Gastrointestinal: Positive for blood in stool and rectal pain. Negative for nausea, vomiting, abdominal pain, diarrhea and abdominal distention.  Genitourinary: Negative for dysuria, urgency, frequency and hematuria.  Musculoskeletal: Positive for back pain. Negative for myalgias, neck pain and neck stiffness.  Skin: Negative for rash.  Allergic/Immunologic: Negative for immunocompromised state.  Neurological: Negative for dizziness, weakness, light-headedness, numbness and headaches.  All other systems reviewed and are negative.     Allergies  Review of patient's allergies indicates no known allergies.  Home Medications   Prior to Admission medications   Medication Sig Start Date End Date Taking? Authorizing Provider  amLODipine (NORVASC) 10 MG tablet Take 1 tablet (10 mg total) by mouth daily. 02/07/15   Henrietta HooverLinda C Bernhardt, NP  carvedilol (COREG) 25 MG tablet Take 1 tablet (25 mg total) by mouth 2 (two) times daily with a meal. 02/07/15   Henrietta HooverLinda C Bernhardt, NP  cholecalciferol (VITAMIN D) 1000 UNITS tablet Take 2 tablets (2,000 Units total) by mouth daily. 01/27/15   Oneta Rackanika N Lewis, NP  cloNIDine (CATAPRES) 0.3 MG tablet Take 1 tablet (0.3 mg total) by mouth 2 (two) times daily. 02/07/15   Henrietta HooverLinda C Bernhardt, NP  gabapentin (NEURONTIN) 400  MG capsule TAKE ONE CAPSULE BY MOUTH THREE TIMES DAILY 05/30/15   Henrietta Hoover, NP  hydrochlorothiazide (MICROZIDE) 12.5 MG capsule Take 1 capsule (12.5 mg total) by mouth daily. 02/07/15   Henrietta Hoover, NP  HYDROcodone-acetaminophen (NORCO/VICODIN) 5-325 MG tablet Take 1-2 tablets by mouth every 6 (six) hours as needed. 02/02/15   Stevi Barrett, PA-C  ibuprofen (ADVIL,MOTRIN) 200 MG tablet Take 200 mg by mouth every 6 (six) hours as needed for moderate pain.    Historical Provider, MD   Multiple Vitamin (MULTIVITAMIN WITH MINERALS) TABS tablet Take 1 tablet by mouth daily. 01/27/15   [name], NP  QUEtiapine (SEROQUEL) 200 MG tablet Take 1 tablet (200 mg total) by mouth at bedtime. 01/27/15   [name], NP   BP 152/98 mmHg  Pulse 69  Temp(Src) 98.6 F (37 C) (Oral)  Resp 18  Ht  (1.854 m)  Wt 99.565 kg  BMI 28.97 kg/m2  SpO2 98% Physical Exam  Constitutional: He is oriented to person, place, and time. He appears well-developed and well-nourished. No distress.  HENT:  Head: Normocephalic and atraumatic.  Eyes: Conjunctivae are normal.  Neck: Neck supple.  Cardiovascular: Normal rate, regular rhythm and normal heart sounds.   Pulmonary/Chest: Effort normal. No respiratory distress. He has no wheezes. He has no rales.  Abdominal: Soft. Bowel sounds are normal. He exhibits no distension. There is no tenderness. There is no rebound.  Genitourinary:  One small soft external hemorrhoid. Patient with significant pain on the rectal exam. I do not feel any swelling, masses, obvious abscesses.  Musculoskeletal: He exhibits no edema.  Tender to palpation over the lower sacrum and tailbone. Skin overlying this area appears to be normal with no evidence of infection. Pain with bilateral leg raise.  Neurological: He is alert and oriented to person, place, and time.  5/5 and equal lower extremity strength. 2+ and equal patellar reflexes bilaterally. Pt able to dorsiflex bilateral toes and feet with good strength against resistance. Equal sensation bilaterally over thighs and lower legs.   Skin: Skin is warm and dry.  Nursing note and vitals reviewed.   ED Course  Procedures (including critical care time) Labs Review Labs Reviewed  CBC WITH DIFFERENTIAL/PLATELET - Abnormal; Notable for the following:    RBC 5.85 (*)    All other components within normal limits  I-STAT CHEM 8, ED - Abnormal; Notable for the following:    BUN 24 (*)    Hemoglobin 17.3 (*)     All other components within normal limits    Imaging Review Ct Pelvis W Contrast  06/07/2015  CLINICAL DATA:  Blood in stool and rectal pain EXAM: CT PELVIS WITH CONTRAST TECHNIQUE: Multidetector CT imaging of the pelvis was performed using the standard protocol following the bolus administration of intravenous contrast. CONTRAST:  OMNIPAQUE IOHEXOL 300 MG/ML  SOLN COMPARISON:  None. FINDINGS: The appendix is within normal limits. The bladder is partially distended. No pelvic mass lesion is seen. No diverticular disease is noted. No significant lymphadenopathy is seen. No perirectal fluid collection is identified. The prostate is within normal limits. No acute bony abnormality is noted. IMPRESSION: No acute abnormality is identified. Electronically Signed   By: Alcide Clever M.D.   On: 06/07/2015 12:53   I have personally reviewed and evaluated these images and lab results as part of my medical decision-making.   EKG Interpretation None      MDM   Final diagnoses:  Coccyx pain  Patient with lower sacrum/coccyx pain, with rectal tenderness. Patient also reports pain with bowel movements and some blood in his stool. Will check labs and do CT pelvis for rule out of infection.  1:03 PM CT  pelvis is negative. Afebrile. WBC normal. Will discharge home with pain medications, muscle relaxants, NSAIDs. Follow with primary care doctor if pain continues.  Filed Vitals:   06/07/15 1148 06/07/15 1235 06/07/15 1245 06/07/15 1311  BP: 174/106 158/105 148/106 168/109  Pulse: 61 65 65 65  Temp:      TempSrc:      Resp: Height:      Weight:      SpO2: 99% 97% 98% 99%       Jaynie Crumble, PA-C 06/07/15 1654  Arby Barrette, MD 06/08/15 1956

## 2015-06-07 NOTE — ED Notes (Signed)
PT is here with lower back pain that started last nite.  Pt reports an old injury and thinks that is where the pain is coming from.  No numbness or tingling to extremities.  Pt states that the pain is making his blood pressure higher.

## 2015-06-07 NOTE — ED Notes (Signed)
Pt given a [place]key sandwich with apple sauce and ginger ale.

## 2015-06-07 NOTE — Discharge Instructions (Signed)
Take ibuprofen for pain. Norco for severe pain. Flexeril for spasms. Follow up with primary care doctor. Return if worening.    Sacroiliac Joint Dysfunction Sacroiliac joint dysfunction is a condition that causes inflammation on one or both sides of the sacroiliac (SI) joint. The SI joint connects the lower part of the spine (sacrum) with the two upper portions of the pelvis (ilium). This condition causes deep aching or burning pain in the low back. In some cases, the pain may also spread into one or both buttocks or hips or spread down the legs. CAUSES This condition may be caused by:  Pregnancy. During pregnancy, extra stress is put on the SI joints because the pelvis widens.  Injury, such as:  Car accidents.  Sport-related injuries.  Work-related injuries.  Having one leg that is shorter than the other.  Conditions that affect the joints, such as:  Rheumatoid arthritis.  Gout.  Psoriatic arthritis.  Joint infection (septic arthritis). Sometimes, the cause of SI joint dysfunction is not known. SYMPTOMS Symptoms of this condition include:  Aching or burning pain in the lower back. The pain may also spread to other areas, such as:  Buttocks.  Groin.  Thighs and legs.  Muscle spasms in or around the painful areas.  Increased pain when standing, walking, running, stair climbing, bending, or lifting. DIAGNOSIS Your health care provider will do a physical exam and take your medical history. During the exam, the health care provider may move one or both of your legs to different positions to check for pain. Various tests may be done to help verify the diagnosis, including:  Imaging tests to look for other causes of pain. These may include:  MRI.  CT scan.  Bone scan.  Diagnostic injection. A numbing medicine is injected into the SI joint using a needle. If the pain is temporarily improved or stopped after the injection, this can indicate that SI joint dysfunction is  the problem. TREATMENT Treatment may vary depending on the cause and severity of your condition. Treatment options may include:  Applying ice or heat to the lower back area. This can help to reduce pain and muscle spasms.  Medicines to relieve pain or inflammation or to relax the muscles.  Wearing a back brace (sacroiliac brace) to help support the joint while your back is healing.  Physical therapy to increase muscle strength around the joint and flexibility at the joint. This may also involve learning proper body positions and ways of moving to relieve stress on the joint.  Direct manipulation of the SI joint.  Injections of steroid medicine into the joint in order to reduce pain and swelling.  Radiofrequency ablation to burn away nerves that are carrying pain messages from the joint.  Use of a device that provides electrical stimulation in order to reduce pain at the joint.  Surgery to put in screws and plates that limit or prevent joint motion. This is rare. HOME CARE INSTRUCTIONS  Rest as needed. Limit your activities as directed by your health care provider.  Take medicines only as directed by your health care provider.  If directed, apply ice to the affected area:  Put ice in a plastic bag.  Place a towel between your skin and the bag.  Leave the ice on for 20 minutes, 2-3 times per day.  Use a heating pad or a moist heat pack as directed by your health care provider.  Exercise as directed by your health care provider or physical therapist.  Keep  all follow-up visits as directed by your health care provider. This is important. SEEK MEDICAL CARE IF:  Your pain is not controlled with medicine.  You have a fever.  You have increasingly severe pain. SEEK IMMEDIATE MEDICAL CARE IF:  You have weakness, numbness, or tingling in your legs or feet.  You lose control of your bladder or bowel.   This information is not intended to replace advice given to you by your  health care provider. Make sure you discuss any questions you have with your health care provider.   Document Released: 06/01/2008 Document Revised: 07/20/2014 Document Reviewed: 11/10/2013 Elsevier Interactive Patient Education [employer]

## 2016-10-03 DIAGNOSIS — R569 Unspecified convulsions: Secondary | ICD-10-CM

## 2016-10-03 DIAGNOSIS — R0609 Other forms of dyspnea: Secondary | ICD-10-CM | POA: Insufficient documentation

## 2016-10-03 DIAGNOSIS — I10 Essential (primary) hypertension: Secondary | ICD-10-CM | POA: Diagnosis present

## 2016-10-03 DIAGNOSIS — R0600 Dyspnea, unspecified: Secondary | ICD-10-CM | POA: Insufficient documentation

## 2016-10-03 DIAGNOSIS — K644 Residual hemorrhoidal skin tags: Secondary | ICD-10-CM | POA: Insufficient documentation

## 2016-10-03 DIAGNOSIS — G40909 Epilepsy, unspecified, not intractable, without status epilepticus: Secondary | ICD-10-CM

## 2016-12-16 DIAGNOSIS — M545 Low back pain, unspecified: Secondary | ICD-10-CM | POA: Insufficient documentation

## 2017-03-29 ENCOUNTER — Observation Stay (HOSPITAL_COMMUNITY)
Admission: EM | Admit: 2017-03-29 | Discharge: 2017-03-30 | Disposition: A | Discharge status: Home / Self Care | Payer: Medicaid Other | Attending: Internal Medicine | Admitting: Internal Medicine

## 2017-03-29 ENCOUNTER — Other Ambulatory Visit: Payer: Self-pay

## 2017-03-29 ENCOUNTER — Encounter (HOSPITAL_COMMUNITY): Payer: Self-pay | Admitting: Emergency Medicine

## 2017-03-29 ENCOUNTER — Emergency Department (HOSPITAL_COMMUNITY): Payer: Medicaid Other

## 2017-03-29 DIAGNOSIS — Z79899 Other long term (current) drug therapy: Secondary | ICD-10-CM

## 2017-03-29 DIAGNOSIS — D62 Acute posthemorrhagic anemia: Secondary | ICD-10-CM

## 2017-03-29 DIAGNOSIS — R079 Chest pain, unspecified: Secondary | ICD-10-CM | POA: Diagnosis not present

## 2017-03-29 DIAGNOSIS — F1010 Alcohol abuse, uncomplicated: Secondary | ICD-10-CM

## 2017-03-29 DIAGNOSIS — F17220 Nicotine dependence, chewing tobacco, uncomplicated: Secondary | ICD-10-CM

## 2017-03-29 DIAGNOSIS — F1410 Cocaine abuse, uncomplicated: Secondary | ICD-10-CM

## 2017-03-29 DIAGNOSIS — R569 Unspecified convulsions: Secondary | ICD-10-CM | POA: Diagnosis not present

## 2017-03-29 DIAGNOSIS — K921 Melena: Secondary | ICD-10-CM | POA: Diagnosis present

## 2017-03-29 DIAGNOSIS — I10 Essential (primary) hypertension: Secondary | ICD-10-CM | POA: Insufficient documentation

## 2017-03-29 DIAGNOSIS — Z87891 Personal history of nicotine dependence: Secondary | ICD-10-CM | POA: Diagnosis not present

## 2017-03-29 DIAGNOSIS — K2970 Gastritis, unspecified, without bleeding: Secondary | ICD-10-CM | POA: Diagnosis present

## 2017-03-29 DIAGNOSIS — F250 Schizoaffective disorder, bipolar type: Secondary | ICD-10-CM

## 2017-03-29 DIAGNOSIS — K2901 Acute gastritis with bleeding: Principal | ICD-10-CM | POA: Insufficient documentation

## 2017-03-29 DIAGNOSIS — F1290 Cannabis use, unspecified, uncomplicated: Secondary | ICD-10-CM | POA: Diagnosis not present

## 2017-03-29 DIAGNOSIS — K920 Hematemesis: Secondary | ICD-10-CM | POA: Diagnosis not present

## 2017-03-29 LAB — CBC
HCT: 39.1 % (ref 39.0–52.0)
Hemoglobin: 12.6 g/dL — ABNORMAL LOW (ref 13.0–17.0)
MCH: 25 pg — ABNORMAL LOW (ref 26.0–34.0)
MCHC: 32.2 g/dL (ref 30.0–36.0)
MCV: 77.6 fL — ABNORMAL LOW (ref 78.0–100.0)
Platelets: 180 10*3/uL (ref 150–400)
RBC: 5.04 MIL/uL (ref 4.22–5.81)
RDW: 13.9 % (ref 11.5–15.5)
WBC: 4.3 10*3/uL (ref 4.0–10.5)

## 2017-03-29 LAB — URINALYSIS, ROUTINE W REFLEX MICROSCOPIC
Bilirubin Urine: NEGATIVE
Glucose, UA: NEGATIVE mg/dL
Hgb urine dipstick: NEGATIVE
Ketones, ur: 15 mg/dL — AB
Leukocytes, UA: NEGATIVE
Nitrite: NEGATIVE
Protein, ur: NEGATIVE mg/dL
Specific Gravity, Urine: >1.03 — ABNORMAL HIGH (ref 1.005–1.030)
pH: 5.5 (ref 5.0–8.0)

## 2017-03-29 LAB — HEPATIC FUNCTION PANEL
ALT: 56 U/L (ref 17–63)
AST: 29 U/L (ref 15–41)
Albumin: 3.6 g/dL (ref 3.5–5.0)
Alkaline Phosphatase: 44 U/L (ref 38–126)
Bilirubin, Direct: 0.1 mg/dL (ref 0.1–0.5)
Indirect Bilirubin: 0.7 mg/dL (ref 0.3–0.9)
Total Bilirubin: 0.8 mg/dL (ref 0.3–1.2)
Total Protein: 5.8 g/dL — ABNORMAL LOW (ref 6.5–8.1)

## 2017-03-29 LAB — HEMOGLOBIN A1C
Hgb A1c MFr Bld: 5.3 % (ref 4.8–5.6)
Mean Plasma Glucose: 105.41 mg/dL

## 2017-03-29 LAB — RAPID URINE DRUG SCREEN, HOSP PERFORMED
Amphetamines: NOT DETECTED
Barbiturates: NOT DETECTED
Benzodiazepines: NOT DETECTED
Cocaine: NOT DETECTED
Opiates: POSITIVE — AB
Tetrahydrocannabinol: POSITIVE — AB

## 2017-03-29 LAB — URINALYSIS, MICROSCOPIC (REFLEX)
Bacteria, UA: NONE SEEN
RBC / HPF: NONE SEEN RBC/hpf (ref 0–5)
Squamous Epithelial / LPF: NONE SEEN
WBC, UA: NONE SEEN WBC/hpf (ref 0–5)

## 2017-03-29 LAB — BASIC METABOLIC PANEL
Anion gap: 9 (ref 5–15)
BUN: 15 mg/dL (ref 6–20)
CO2: 24 mmol/L (ref 22–32)
Calcium: 9.1 mg/dL (ref 8.9–10.3)
Chloride: 106 mmol/L (ref 101–111)
Creatinine, Ser: 0.96 mg/dL (ref 0.61–1.24)
GFR calc Af Amer: >60 mL/min (ref >60)
GFR calc non Af Amer: >60 mL/min (ref >60)
Glucose, Bld: 134 mg/dL — ABNORMAL HIGH (ref 65–99)
Potassium: 3.5 mmol/L (ref 3.5–5.1)
Sodium: 139 mmol/L (ref 135–145)

## 2017-03-29 LAB — HIV ANTIBODY (ROUTINE TESTING W REFLEX): HIV Screen 4th Generation wRfx: NONREACTIVE

## 2017-03-29 LAB — TROPONIN I
Troponin I: <0.03 ng/mL (ref <0.03)
Troponin I: <0.03 ng/mL (ref <0.03)
Troponin I: <0.03 ng/mL (ref <0.03)

## 2017-03-29 LAB — I-STAT TROPONIN, ED: Troponin i, poc: 0 ng/mL (ref 0.00–0.08)

## 2017-03-29 LAB — LIPASE, BLOOD: Lipase: 55 U/L — ABNORMAL HIGH (ref 11–51)

## 2017-03-29 MED ORDER — HYDROCODONE-ACETAMINOPHEN 5-325 MG PO TABS
1.0000 | ORAL_TABLET | Freq: Once | ORAL | Status: AC
  Administered 2017-03-29: 1 via ORAL
  Filled 2017-03-29: qty 1

## 2017-03-29 MED ORDER — ONDANSETRON HCL 4 MG/2ML IJ SOLN: 4.0000 mg | Freq: Four times a day (QID) | INTRAMUSCULAR | Status: DC | PRN

## 2017-03-29 MED ORDER — QUETIAPINE FUMARATE 50 MG PO TABS
400.0000 mg | ORAL_TABLET | Freq: Every day | ORAL | Status: DC
  Administered 2017-03-29: 400 mg via ORAL
  Filled 2017-03-29: qty 1

## 2017-03-29 MED ORDER — ADULT MULTIVITAMIN W/MINERALS CH
1.0000 | ORAL_TABLET | Freq: Every day | ORAL | Status: DC
  Administered 2017-03-29 – 2017-03-30 (×2): 1 via ORAL
  Filled 2017-03-29 (×2): qty 1

## 2017-03-29 MED ORDER — MORPHINE SULFATE (PF) 4 MG/ML IV SOLN
1.0000 mg | INTRAVENOUS | Status: DC | PRN
  Administered 2017-03-29 – 2017-03-30 (×2): 1 mg via INTRAVENOUS
  Filled 2017-03-29 (×2): qty 1

## 2017-03-29 MED ORDER — HYDROCHLOROTHIAZIDE 25 MG PO TABS
25.0000 mg | ORAL_TABLET | Freq: Every day | ORAL | Status: DC
  Administered 2017-03-29 – 2017-03-30 (×2): 25 mg via ORAL
  Filled 2017-03-29 (×2): qty 1

## 2017-03-29 MED ORDER — NITROGLYCERIN 0.4 MG SL SUBL: 0.4000 mg | SUBLINGUAL_TABLET | SUBLINGUAL | Status: DC | PRN

## 2017-03-29 MED ORDER — PANTOPRAZOLE SODIUM 40 MG IV SOLR
40.0000 mg | Freq: Two times a day (BID) | INTRAVENOUS | Status: DC
  Administered 2017-03-29 (×2): 40 mg via INTRAVENOUS
  Filled 2017-03-29 (×2): qty 40

## 2017-03-29 MED ORDER — ACETAMINOPHEN 325 MG PO TABS
650.0000 mg | ORAL_TABLET | ORAL | Status: DC | PRN
  Administered 2017-03-29 – 2017-03-30 (×2): 650 mg via ORAL
  Filled 2017-03-29 (×2): qty 2

## 2017-03-29 MED ORDER — PHENYTOIN SODIUM EXTENDED 100 MG PO CAPS
100.0000 mg | ORAL_CAPSULE | Freq: Every day | ORAL | Status: DC
Start: 2017-03-29 — End: 2017-03-30
  Administered 2017-03-29 – 2017-03-30 (×2): 100 mg via ORAL
  Filled 2017-03-29 (×2): qty 1

## 2017-03-29 MED ORDER — VITAMIN B-1 100 MG PO TABS
100.0000 mg | ORAL_TABLET | Freq: Every day | ORAL | Status: DC
  Administered 2017-03-30: 100 mg via ORAL
  Filled 2017-03-29: qty 1

## 2017-03-29 MED ORDER — FOLIC ACID 1 MG PO TABS
1.0000 mg | ORAL_TABLET | Freq: Every day | ORAL | Status: DC
  Administered 2017-03-29 – 2017-03-30 (×2): 1 mg via ORAL
  Filled 2017-03-29 (×2): qty 1

## 2017-03-29 MED ORDER — THIAMINE HCL 100 MG/ML IJ SOLN
100.0000 mg | Freq: Every day | INTRAMUSCULAR | Status: DC
  Administered 2017-03-29: 100 mg via INTRAVENOUS
  Filled 2017-03-29: qty 2

## 2017-03-29 MED ORDER — LISINOPRIL 40 MG PO TABS
40.0000 mg | ORAL_TABLET | Freq: Every day | ORAL | Status: DC
  Administered 2017-03-29 – 2017-03-30 (×2): 40 mg via ORAL
  Filled 2017-03-29 (×2): qty 1

## 2017-03-29 MED ORDER — CLONIDINE HCL 0.3 MG PO TABS
0.3000 mg | ORAL_TABLET | Freq: Two times a day (BID) | ORAL | Status: DC
  Administered 2017-03-29 – 2017-03-30 (×3): 0.3 mg via ORAL
  Filled 2017-03-29 (×2): qty 1

## 2017-03-29 MED ORDER — MORPHINE SULFATE (PF) 4 MG/ML IV SOLN
2.0000 mg | INTRAVENOUS | Status: DC | PRN
  Administered 2017-03-29: 2 mg via INTRAVENOUS
  Filled 2017-03-29 (×2): qty 1

## 2017-03-29 NOTE — ED Triage Notes (Signed)
Per EMS, pt from home with c/o sharp right sided chest pain. Denies shortness of breath/nausea/vomiting/diaphoresis. C/o generalized weakness and dizziness on standing. Given 324 aspirin, 1 NTG with no change in pain. EMS vitals: BP 149/82, HR-74, SpO2 98% room air

## 2017-03-29 NOTE — ED Notes (Signed)
Breakfast tray ordered 

## 2017-03-29 NOTE — ED Notes (Signed)
Patient transported to X-ray 

## 2017-03-29 NOTE — H&P (Signed)
Date: 03/29/2017               Patient Name:  Adrian Alexander MRN: [id number]  DOB: 09/20/1960 Age / Sex: 57 y.o., male   PCP: Patient, No Pcp Per         Medical Service: Internal Medicine Teaching Service         Attending Physician: Dr. Sandre Kittyaines, Elwin MochaAlexander N, MD    First Contact: Dr. Caron Alexander Pager: [phone]  Second Contact: Dr. Vincente Alexander Pager: [phone]       After Hours (After 5p/  First Contact Pager: [phone]  weekends / holidays): Second Contact Pager: [phone]   Chief Complaint: Chest pain  History of Present Illness:  Adrian Alexander is a 57 year old gentleman with a past medical history significant for hypertension, seizure disorder, bipolar disorder, schizoaffective disorder, ETOH abuse and substance abuse presenting with complaints of chest pain. He reports that early this morning he was awoken from sleep with chest pain. He reports the pain is substernal, non-radiating, and reports it is sharp in nature however he later described the pain as someone squeezing his heart. He notes associated shortness of breath, dizziness, blurry vision with the onset of the chest pain. He does report 2 similar but less intense episodes of chest pain in the past year but has not sought any medical care for these issues. He received nitroglycerin en route to the hospital and reports no relief of his chest pain. He received a dose of Vicodin in the ED with improvement but not resolution of his chest pain and improvement in his shortness of breath and dizziness. Denies any diaphoresis or nausea/vomiting. He does note that his father passed away about a month ago and his ex-wife also recently passed away in the last couple of weeks. He reports going on an alcohol binge for the past few weeks due to these recent deaths. Reports drinking a case of beer a day as well as additional liquor at times during this several week long binge. He also notes taking cocaine during this time frame as well and reports his last use  was over the weekend 5 days ago. He reports he stopped drinking any alcohol 4 days ago. Denies any history of withdrawal seizures, hallucinations, DTs. He notes that he stopped drinking because he began to have bloody emesis and abdominal pain. After stopping his ETOH his emesis resolved but he subsequently started having bloody bowel movements. He notes 4 grossly bloody bowel movement in the past 4 days with his last bowel movement yesterday. Notes blood clots in his stools as well.   Meds:  Current Meds  Medication Sig  . cloNIDine (CATAPRES) 0.3 MG tablet Take 1 tablet (0.3 mg total) by mouth 2 (two) times daily.  . cyclobenzaprine (FLEXERIL) 5 MG tablet Take 5 mg by mouth 3 (three) times daily.  Marland Kitchen. gabapentin (NEURONTIN) 400 MG capsule TAKE ONE CAPSULE BY MOUTH THREE TIMES DAILY  . hydrochlorothiazide (HYDRODIURIL) 25 MG tablet Take 25 mg by mouth daily.  Marland Kitchen. lisinopril (PRINIVIL,ZESTRIL) 40 MG tablet Take 40 mg by mouth daily.  . phenytoin (DILANTIN) 100 MG ER capsule Take 100 mg by mouth daily.  . QUEtiapine (SEROQUEL) 400 MG tablet Take 400 mg by mouth at bedtime.   Allergies: Allergies as of 03/29/2017  . (No Known Allergies)   Past Medical History:  Diagnosis Date  . Anxiety   . Bipolar affective disorder, depressed (HCC)   . Hypertension   . Schizo-affective schizophrenia (HCC)   .  Schizophrenia, schizo-affective (HCC)   . Seizure (HCC)   . Seizures (HCC)    Last month. gets a sensation   Family History:  Family History  Problem Relation Age of Onset  . Cancer Mother 35       colon  . Heart disease Father   . Heart attack Father 50   Social History:  Social History   Socioeconomic History  . Marital status: Divorced    Spouse name: None  . Number of children: None  . Years of education: None  . Highest education level: None  Social Needs  . Financial resource strain: None  . Food insecurity - worry: None  . Food insecurity - inability: None  . Transportation  needs - medical: None  . Transportation needs - non-medical: None  Occupational History  . None  Tobacco Use  . Smoking status: Former Smoker    Packs/day: 0.10    Years: 20.00    Pack years: 2.00    Types: Cigarettes  . Smokeless tobacco: Current User    Types: Snuff  Substance and Sexual Activity  . Alcohol use: Yes    Alcohol/week: 25.2 oz    Types: 42 Cans of beer per week  . Drug use: Yes    Types: Marijuana, Cocaine  . Sexual activity: Yes    Birth control/protection: Condom  Other Topics Concern  . None  Social History Narrative   Lives in [place] alone    Review of Systems: A complete ROS was negative except as per HPI.   Physical Exam: Blood pressure 136/72, pulse 65, temperature 98.3 F (36.8 C), temperature source Oral, resp. rate 17, height 6\' 1"  (1.854 m), weight 215 lb (97.5 kg), SpO2 98 %. General: alert, well-developed, and cooperative to examination.  Head: normocephalic and atraumatic.  Eyes: vision grossly intact, pupils equal, pupils round, pupils reactive to light, no injection and anicteric.  Mouth: pharynx pink and moist, no erythema, and no exudates.  Neck: supple, full ROM, no thyromegaly, no JVD, and no carotid bruits. Lungs: normal respiratory effort, no accessory muscle use, normal breath sounds, no crackles, and no wheezes. Heart: normal rate, regular rhythm, no murmur, no gallop, and no rub.  Abdomen: soft, non-tender, normal bowel sounds, no distention, no guarding, no rebound tenderness Msk: no joint swelling, no joint warmth, and no redness over joints.  Pulses: 2+ DP/PT pulses bilaterally Extremities: No cyanosis, clubbing, edema Neurologic: alert & oriented X3, no focal deficits Skin: turgor normal and no rashes.  Psych: normal mood and affect  EKG: personally reviewed my interpretation is NSR, new T-wave inversions in leads V3 and V4.   CXR: personally reviewed my interpretation is no acute cardiopulmonary disease  Assessment &  Plan by Problem:  Chest pain: Patient with acute onset of substernal chest pain this morning that woke him from sleep. Does describe the chest pain as like 'someone is squeezing his heart' with associated shortness of breath. He also notes occurrences of similar chest pain at least twice in the past year. Initial troponin in the ED negative. EKG with new T-wave inversions in leads V3 and V4 compared to previous in 2016. He does have risk factors including HTN and family history (reports father died of MI at age 27). He also notes being told in the past he had elevated cholesterol levels but has not been on any medications. Heart score at least 4. He also admits to recent cocaine use but reports this was more than 5 days preceding onset  of his chest pain. UDS in the ED only positive for [facility] and opiates (received in ED prior to screen). Will admit and trend troponins and repeat EKG in the morning. Check lipid panel in the am. Did have elevated CBG of 134 on presentation overnight. Will check A1c as well. Will keep NPO overnight for consideration of stress test tomorrow pending results of work-up.  Hematemesis and Hematochezia: Symptoms began following extended alcohol binge drinking episode. Denies any NSAID use. Initially began as hematemesis that resolved after cessation of ETOH. However, subsequently has had 1 grossly bloody bowel movement daily for the past 4 days. Notes blood clots as well. Denies any melena. Denies any previous history of hematemesis or hematochezia. CBC in the ED with Hgb 12.6 down from previous levels in the past of ~15. MCV low at 77.6. He also notes that his mother was diagnosed with colon cancer at age 71. He has never had a colonoscopy. Story most consistent with etoh induced gastritis. However, given his family history and microcytic anemia (though MCV has been ~79 in the past) some concern for additional process and potential underlying malignancy. Will start IV Protonix 40 mg bid  for possible gastritis. Checking iron studies and trend CBC. Unable to perform rectal examination on patient as he is currently in ED overflow in the hall. Will need EDG/Colonoscopy at some point but may be able to be arranged for outpatient follow up if no further bleeding during admission though no current active bleeding.  ETOH abuse: Recent ETOH binge. Reports last drink was 4 days ago. Denies any history of withdrawal. CIWA ordered.   HTN: BP controlled in the ED, 130 systolic. Will continue home BP meds including clonidine 0.3 mg bid, lisinopril 40 mg daily and hctz 25 mg daily.   Bipolar/Schizoaffective Disorders: Continue home Seroquel qhs.  Seizure Disorder: Continue home dilatnin  Dispo: Admit patient to Observation with expected length of stay less than 2 midnights.  Signed: Valentino Nose, MD 03/29/2017, 10:07 AM  Pager: [phone]

## 2017-03-29 NOTE — ED Provider Notes (Signed)
[facility] EMERGENCY DEPARTMENT Provider Note   CSN: [id number] Arrival date & time: 03/29/17  4098     History   Chief Complaint Chief Complaint  Patient presents with  . Chest Pain    HPI Javaun Dimperio is a 57 y.o. male.  The history is provided by the patient.  Chest Pain   This is a new problem. The current episode started 3 to 5 hours ago. The problem occurs constantly. The problem has not changed since onset.The pain is present in the substernal region. The pain is moderate. The quality of the pain is described as pressure-like. The pain does not radiate. Associated symptoms include dizziness and shortness of breath. Pertinent negatives include no fever. He has tried nitroglycerin for the symptoms. The treatment provided no relief.  Patient reports he had onset of chest pain earlier tonight, with associated shortness of breath and dizziness No syncope, no vomiting He was given aspirin and nitroglycerin by EMS without any improvement   Past Medical History:  Diagnosis Date  . Anxiety   . Bipolar affective disorder, depressed (HCC)   . Hypertension   . Schizo-affective schizophrenia (HCC)   . Schizophrenia, schizo-affective (HCC)   . Seizure (HCC)   . Seizures (HCC)    Last month. gets a sensation    Patient Active Problem List   Diagnosis Date Noted  . Alcohol dependence with withdrawal, uncomplicated (HCC) 01/25/2015  . Post traumatic stress disorder (PTSD) 01/25/2015  . Schizoaffective disorder (HCC) 01/25/2015  . Cocaine abuse with cocaine-induced mood disorder (HCC) 01/25/2015  . MDD (major depressive disorder) 01/24/2015  . Substance induced mood disorder (HCC) 09/10/2011    Past Surgical History:  Procedure Laterality Date  . ANKLE SURGERY    . CLOSED REDUCTION WITH HUMER PIN INSERTION     left ankle 1998  . HERNIA REPAIR     while in prison  . HERNIA REPAIR         Home Medications    Prior to Admission medications     Medication Sig Start Date End Date Taking? Authorizing Provider  amLODipine (NORVASC) 10 MG tablet Take 1 tablet (10 mg total) by mouth daily. 02/07/15   Henrietta Hoover, NP  carvedilol (COREG) 25 MG tablet Take 1 tablet (25 mg total) by mouth 2 (two) times daily with a meal. 02/07/15   Henrietta Hoover, NP  cholecalciferol (VITAMIN D) 1000 UNITS tablet Take 2 tablets (2,000 Units total) by mouth daily. 01/27/15   [name], NP  cloNIDine (CATAPRES) 0.3 MG tablet Take 1 tablet (0.3 mg total) by mouth 2 (two) times daily. 02/07/15   Henrietta Hoover, NP  cyclobenzaprine (FLEXERIL) 10 MG tablet Take 1 tablet (10 mg total) by mouth 2 (two) times daily as needed for muscle spasms. 06/07/15   Kirichenko, Lemont Fillers, PA-C  gabapentin (NEURONTIN) 400 MG capsule TAKE ONE CAPSULE BY MOUTH THREE TIMES DAILY 05/30/15   Henrietta Hoover, NP  hydrochlorothiazide (MICROZIDE) 12.5 MG capsule Take 1 capsule (12.5 mg total) by mouth daily. 02/07/15   Henrietta Hoover, NP  HYDROcodone-acetaminophen (NORCO) 5-325 MG tablet Take 1 tablet by mouth every 6 (six) hours as needed. 06/07/15   Kirichenko, Tatyana, PA-C  ibuprofen (ADVIL,MOTRIN) 600 MG tablet Take 1 tablet (600 mg total) by mouth every 6 (six) hours as needed. 06/07/15   Kirichenko, Lemont Fillers, PA-C  Multiple Vitamin (MULTIVITAMIN WITH MINERALS) TABS tablet Take 1 tablet by mouth daily. 01/27/15   [name], NP  QUEtiapine (SEROQUEL) 200 MG tablet Take 1 tablet (200 mg total) by mouth at bedtime. 01/27/15   Oneta RackLewis, Tanika N, NP    Family History Family History  Problem Relation Age of Onset  . Cancer Mother 6950       colon  . Heart disease Father     Social History Social History   Tobacco Use  . Smoking status: Former Smoker    Packs/day: 0.10    Years: 20.00    Pack years: 2.00    Types: Cigarettes  . Smokeless tobacco: Current User    Types: Snuff  Substance Use Topics  . Alcohol use: No    Alcohol/week: 25.2 oz    Types: 42  Cans of beer per week    Comment: 6pk/day-  States not in last week 06/07/2015  . Drug use: No     Allergies   Patient has no known allergies.   Review of Systems Review of Systems  Constitutional: Negative for fever.  Respiratory: Positive for shortness of breath.   Cardiovascular: Positive for chest pain.  Neurological: Positive for dizziness. Negative for syncope.  All other systems reviewed and are negative.    Physical Exam Updated Vital Signs BP (!) 144/80 (BP Location: Right Arm)   Pulse 70   Temp 98.3 F (36.8 C) (Oral)   Resp 18   Ht 1.854 m (6\' 1" )   Wt 97.5 kg (215 lb)   SpO2 100%   BMI 28.37 kg/m   Physical Exam CONSTITUTIONAL: Well developed/well nourished HEAD: Normocephalic/atraumatic EYES: EOMI/PERRL ENMT: Mucous membranes moist NECK: supple no meningeal signs SPINE/BACK:entire spine nontender CV: S1/S2 noted, no murmurs/rubs/gallops noted LUNGS: Lungs are clear to auscultation bilaterally, no apparent distress ABDOMEN: soft, nontender, no rebound or guarding, bowel sounds noted throughout abdomen GU:no cva tenderness NEURO: Pt is awake/alert/appropriate, moves all extremitiesx4.  No facial droop.   EXTREMITIES: pulses normal/equal, full ROM, no calf tenderness or edema SKIN: warm, color normal PSYCH: no abnormalities of mood noted, alert and oriented to situation   ED Treatments / Results  Labs (all labs ordered are listed, but only abnormal results are displayed) Labs Reviewed  BASIC METABOLIC PANEL - Abnormal; Notable for the following components:      Result Value   Glucose, Bld 134 (*)    All other components within normal limits  CBC - Abnormal; Notable for the following components:   Hemoglobin 12.6 (*)    MCV 77.6 (*)    MCH 25.0 (*)    All other components within normal limits  LIPASE, BLOOD  HEPATIC FUNCTION PANEL  I-STAT TROPONIN, ED    EKG  EKG Interpretation  Date/Time:  Friday March 29 2017 02:52:47  EST Ventricular Rate:  67 PR Interval:  176 QRS Duration: 104 QT Interval:  464 QTC Calculation: 490 R Axis:   -47 Text Interpretation:  Normal sinus rhythm Left atrial enlargement Left anterior fascicular block T wave abnormality, consider lateral ischemia Prolonged QT Abnormal ECG Confirmed by Zadie RhineWickline, Haedyn Ancrum ([id number]) on 03/29/2017 2:58:58 AM       Radiology Dg Chest 2 View  Result Date: 03/29/2017 CLINICAL DATA:  57 year old male with chest pain and shortness of breath and dizziness. EXAM: CHEST  2 VIEW COMPARISON:  Thoracic spine radiograph dated 07/13/2012 FINDINGS: The lungs are clear. There is no pleural effusion or pneumothorax top-normal cardiac size no acute osseous pathology. IMPRESSION: No active cardiopulmonary disease. Electronically Signed   By: Elgie CollardArash  Radparvar M.D.   On: 03/29/2017 04:14  Procedures Procedures (including critical care time)  Medications Ordered in ED Medications  HYDROcodone-acetaminophen (NORCO/VICODIN) 5-325 MG per tablet 1 tablet (1 tablet Oral Given 03/29/17 0413)     Initial Impression / Assessment and Plan / ED Course  I have reviewed the triage vital signs and the nursing notes.  Pertinent labs & imaging results that were available during my care of the patient were reviewed by me and considered in my medical decision making (see chart for details).     5:17 AM Heart score equals 4 Patient will need to be admitted for further workup 6:31 AM Patient resting comfortably at this time, feels improved Due to high heart score, will admit Patient also mentions a variety of other complaints including recent rectal bleeding that is been improving, as well as need for "detox " I discussed this with the internal medicine residency for admission Patient will be admitted to the internal medicine service  At this point patient is medically stable, my suspicion for acute PE/dissection is low Final Clinical Impressions(s) / ED Diagnoses   Final  diagnoses:  Chest pain, rule out acute myocardial infarction    ED Discharge Orders    None       Zadie Rhine, MD 03/29/17 [phone]

## 2017-03-29 NOTE — ED Notes (Signed)
Attempted report to 3E. 

## 2017-03-29 NOTE — ED Notes (Signed)
Pt appears in no distress at this time.  He reports continued pain in his cheat which he rates a 8/10. This pain is felt with movement, palpation and deep breathing.  Pt is having his lunch and appears in no distress at this time

## 2017-03-29 NOTE — Progress Notes (Addendum)
Patient continues to have chest pain and thus was refused admission to telemetry floor. Discussed with my attending who has evaluated the patient and does not feel that his chest pain is cardiac in nature. Pain is likely secondary to alcoholic gastritis vs. MSK in etiology. It is reproducible on exam and very atypical for ACS. Plan is to consult GI in the morning for possible EGD. Will discuss admission to telemetry with bed control given the low likelihood that this pain is cardiac in nature.   ADDENDUM: Bed control unwilling to make exception for patient. Stated that telemetry will not accept patient with active chest pain regardless of it being non cardiac in nature. Placed orders for stepdown which I do not feel is appropriate. Patient will likely remain in ED overflow as there are currently no stepdown beds available. Will place safety zone portal to address this issue going forward.

## 2017-03-29 NOTE — ED Notes (Signed)
Lunch tray ordered 

## 2017-03-30 ENCOUNTER — Other Ambulatory Visit: Payer: Self-pay

## 2017-03-30 DIAGNOSIS — I10 Essential (primary) hypertension: Secondary | ICD-10-CM | POA: Diagnosis not present

## 2017-03-30 DIAGNOSIS — R0789 Other chest pain: Secondary | ICD-10-CM | POA: Diagnosis not present

## 2017-03-30 DIAGNOSIS — K2901 Acute gastritis with bleeding: Secondary | ICD-10-CM | POA: Diagnosis not present

## 2017-03-30 DIAGNOSIS — K921 Melena: Secondary | ICD-10-CM | POA: Diagnosis not present

## 2017-03-30 DIAGNOSIS — K920 Hematemesis: Secondary | ICD-10-CM

## 2017-03-30 DIAGNOSIS — F1099 Alcohol use, unspecified with unspecified alcohol-induced disorder: Secondary | ICD-10-CM

## 2017-03-30 LAB — LIPID PANEL
Cholesterol: 116 mg/dL (ref 0–200)
HDL: 41 mg/dL (ref >40)
LDL Cholesterol: 62 mg/dL (ref 0–99)
Total CHOL/HDL Ratio: 2.8 RATIO
Triglycerides: 64 mg/dL (ref <150)
VLDL: 13 mg/dL (ref 0–40)

## 2017-03-30 LAB — CBC
HCT: 38.9 % — ABNORMAL LOW (ref 39.0–52.0)
Hemoglobin: 12.7 g/dL — ABNORMAL LOW (ref 13.0–17.0)
MCH: 25.5 pg — ABNORMAL LOW (ref 26.0–34.0)
MCHC: 32.6 g/dL (ref 30.0–36.0)
MCV: 78 fL (ref 78.0–100.0)
Platelets: 163 10*3/uL (ref 150–400)
RBC: 4.99 MIL/uL (ref 4.22–5.81)
RDW: 14.1 % (ref 11.5–15.5)
WBC: 2.8 10*3/uL — ABNORMAL LOW (ref 4.0–10.5)

## 2017-03-30 LAB — IRON AND TIBC
Iron: 94 ug/dL (ref 45–182)
Saturation Ratios: 32 % (ref 17.9–39.5)
TIBC: 298 ug/dL (ref 250–450)
UIBC: 204 ug/dL

## 2017-03-30 LAB — FERRITIN: Ferritin: 131 ng/mL (ref 24–336)

## 2017-03-30 MED ORDER — PANTOPRAZOLE SODIUM 40 MG PO TBEC
80.0000 mg | DELAYED_RELEASE_TABLET | Freq: Two times a day (BID) | ORAL | Status: DC
  Administered 2017-03-30: 80 mg via ORAL
  Filled 2017-03-30: qty 2

## 2017-03-30 MED ORDER — PANTOPRAZOLE SODIUM 40 MG PO TBEC: 40.0000 mg | DELAYED_RELEASE_TABLET | Freq: Two times a day (BID) | ORAL | Status: DC

## 2017-03-30 MED ORDER — PANTOPRAZOLE SODIUM 40 MG PO TBEC: 80.0000 mg | DELAYED_RELEASE_TABLET | Freq: Two times a day (BID) | ORAL | 0 refills | Status: AC

## 2017-03-30 NOTE — Progress Notes (Signed)
   Subjective: Patient doing well this morning.  States that his chest/abdominal pain have improved.  He has not attempted to eat anything this morning as he was n.p.o.  We discussed restarting his normal diet and seeing how he does with that.  We discussed his case with GI who felt outpatient follow-up was appropriate.  We discussed this with the patient.  He agrees with the plan.  All questions and concerns addressed.  Objective: Vital signs in last 24 hours: Vitals:   03/30/17 0100 03/30/17 0200 03/30/17 0344 03/30/17 0349  BP: 132/86 136/87  120/75  Pulse: (!) 53 (!) 52  (!) 52  Resp: 14 14  12   Temp:    (!) 97.5 F (36.4 C)  TempSrc:    Oral  SpO2: 98% 98%  99%  Weight:   214 lb 14.4 oz (97.5 kg)   Height:   6\' 1"  (1.854 m)    General: Well-nourished male in no acute distress Pulm: Good air movement with no wheezing or crackles CV: Regular rate and rhythm with no murmurs or rubs heard Abdomen: Active bowel sounds, soft, tenderness to palpation of the epigastric region Extremities: No lower extremity edema  Assessment/Plan:  Adrian Alexander is a 57 year old male who presented to the ED with acute onset sharp/squeezing midsternal chest pain that awoke him from sleep and hematemesis/hematochezia. His chest pain/abdominal pain have improved overnight. No episodes of hematemesis/hematochezia since admission.   Chest pain. - Patient presented with acute onset sharp/squeezing midsternal chest pain. Chest pain has improved. - Patient has reproducible chest pain on exam. - EKG showing some T wave inversion in the lateral leads that is changed compared to prior EKGs. - Troponin negative x3 - Based on available clinical information the most likely etiology of the patient's chest pain is noncardiac and more likely to be of gastric or musculoskeletal etiology. - Started on a PPI twice daily  Hematemesis/hematochezia - Patient states that after going on an alcohol bender, he began to  experience hematemesis followed by hematochezia 4 days duration.  This would be concerning for a brisk upper GI bleed, however, the patient's vital signs are normal with no tachycardia or hypotension to suggest severe volume loss. 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 - Continue Protonix 80 mg twice daily - Case discussed with GI who feels that since the patient is stable he can follow-up as an outpatient to pursue further workup. - Patient advised to cut back on alcohol intake  Alcohol use disorder - Reports his last drink 4 days ago - CIWA 0 overnight - Continuing CIWA without Ativan  Hypertension - Continue home medications including clonidine 0.3 mg twice daily, lisinopril 40 mg, and hydrochlorothiazide 25 mg daily  Bipolar/schizoaffective disorder - Continue home Seroquel  Seizure disorder - Continue home phenytoin  Dispo: Anticipated discharge in approximately 0 day(s).   Levora DredgeHelberg, Rebecca Motta, MD 03/30/2017, 5:56 AM My Pager: [phone]

## 2017-03-30 NOTE — Discharge Instructions (Signed)
Heart-Healthy Eating Plan Heart-healthy meal planning includes:  Limiting unhealthy fats.  Increasing healthy fats.  Making other small dietary changes.  You may need to talk with your doctor or a diet specialist (dietitian) to create an eating plan that is right for you. What types of fat should I choose?  Choose healthy fats. These include olive oil and canola oil, flaxseeds, walnuts, almonds, and seeds.  Eat more omega-3 fats. These include salmon, mackerel, sardines, tuna, flaxseed oil, and ground flaxseeds. Try to eat fish at least twice each week.  Limit saturated fats. ? Saturated fats are often found in animal products, such as meats, butter, and cream. ? Plant sources of saturated fats include palm oil, palm kernel oil, and coconut oil.  Avoid foods with partially hydrogenated oils in them. These include stick margarine, some tub margarines, cookies, crackers, and other baked goods. These contain trans fats. What general guidelines do I need to follow?  Check food labels carefully. Identify foods with trans fats or high amounts of saturated fat.  Fill one half of your plate with vegetables and green salads. Eat 4-5 servings of vegetables per day. A serving of vegetables is: ? 1 cup of raw leafy vegetables. ?  cup of raw or cooked cut-up vegetables. ?  cup of vegetable juice.  Fill one fourth of your plate with whole grains. Look for the word "whole" as the first word in the ingredient list.  Fill one fourth of your plate with lean protein foods.  Eat 4-5 servings of fruit per day. A serving of fruit is: ? One medium whole fruit. ?  cup of dried fruit. ?  cup of fresh, frozen, or canned fruit. ?  cup of 100% fruit juice.  Eat more foods that contain soluble fiber. These include apples, broccoli, carrots, beans, peas, and barley. Try to get 20-30 g of fiber per day.  Eat more home-cooked food. Eat less restaurant, buffet, and fast food.  Limit or avoid  alcohol.  Limit foods high in starch and sugar.  Avoid fried foods.  Avoid frying your food. Try baking, boiling, grilling, or broiling it instead. You can also reduce fat by: ? Removing the skin from poultry. ? Removing all visible fats from meats. ? Skimming the fat off of stews, soups, and gravies before serving them. ? Steaming vegetables in water or broth.  Lose weight if you are overweight.  Eat 4-5 servings of nuts, legumes, and seeds per week: ? One serving of dried beans or legumes equals  cup after being cooked. ? One serving of nuts equals 1 ounces. ? One serving of seeds equals  ounce or one tablespoon.  You may need to keep track of how much salt or sodium you eat. This is especially true if you have high blood pressure. Talk with your doctor or dietitian to get more information. What foods can I eat? Grains Breads, including [place], white, pita, wheat, raisin, rye, oatmeal, and [place]. Tortillas that are neither fried nor made with lard or trans fat. Low-fat rolls, including hotdog and hamburger buns and English muffins. Biscuits. Muffins. Waffles. Pancakes. Light popcorn. Whole-grain cereals. Flatbread. Melba toast. Pretzels. Breadsticks. Rusks. Low-fat snacks. Low-fat crackers, including oyster, saltine, matzo, graham, animal, and rye. Rice and pasta, including brown rice and pastas that are made with whole wheat. Vegetables All vegetables. Fruits All fruits, but limit coconut. Meats and Other Protein Sources Lean, well-trimmed beef, veal, pork, and lamb. Chicken and [place] without skin. All fish and shellfish.  Wild duck, rabbit, pheasant, and venison. Egg whites or low-cholesterol egg substitutes. Dried beans, peas, lentils, and tofu. Seeds and most nuts. Dairy Low-fat or nonfat cheeses, including ricotta, string, and mozzarella. Skim or 1% milk that is liquid, powdered, or evaporated. Buttermilk that is made with low-fat milk. Nonfat or low-fat  yogurt. Beverages Mineral water. Diet carbonated beverages. Sweets and Desserts Sherbets and fruit ices. Honey, jam, marmalade, jelly, and syrups. Meringues and gelatins. Pure sugar candy, such as hard candy, jelly beans, gumdrops, mints, marshmallows, and small amounts of dark chocolate. MGM MIRAGE[employer]el food cake. Eat all sweets and desserts in moderation. Fats and Oils Nonhydrogenated (trans-free) margarines. Vegetable oils, including soybean, sesame, sunflower, olive, peanut, safflower, corn, canola, and cottonseed. Salad dressings or mayonnaise made with a vegetable oil. Limit added fats and oils that you use for cooking, baking, salads, and as spreads. Other Cocoa powder. Coffee and tea. All seasonings and condiments. The items listed above may not be a complete list of recommended foods or beverages. Contact your dietitian for more options. What foods are not recommended? Grains Breads that are made with saturated or trans fats, oils, or whole milk. Croissants. Butter rolls. Cheese breads. Sweet rolls. Donuts. Buttered popcorn. Chow mein noodles. High-fat crackers, such as cheese or butter crackers. Meats and Other Protein Sources Fatty meats, such as hotdogs, short ribs, sausage, spareribs, bacon, rib eye roast or steak, and mutton. High-fat deli meats, such as salami and bologna. Caviar. Domestic duck and goose. Organ meats, such as kidney, liver, sweetbreads, and heart. Dairy Cream, sour cream, cream cheese, and creamed cottage cheese. Whole-milk cheeses, including blue (bleu), [address], [place], [place], [address], [place]i, [address], cheddar, [place]embert, and [place]. Whole or 2% milk that is liquid, evaporated, or condensed. Whole buttermilk. Cream sauce or high-fat cheese sauce. Yogurt that is made from whole milk. Beverages Regular sodas and juice drinks with added sugar. Sweets and Desserts Frosting. Pudding. Cookies. Cakes other than angel food cake. Candy that has milk chocolate or white  chocolate, hydrogenated fat, butter, coconut, or unknown ingredients. Buttered syrups. Full-fat ice cream or ice cream drinks. Fats and Oils Gravy that has suet, meat fat, or shortening. Cocoa butter, hydrogenated oils, palm oil, coconut oil, palm kernel oil. These can often be found in baked products, candy, fried foods, nondairy creamers, and whipped toppings. Solid fats and shortenings, including bacon fat, salt pork, lard, and butter. Nondairy cream substitutes, such as coffee creamers and sour cream substitutes. Salad dressings that are made of unknown oils, cheese, or sour cream. The items listed above may not be a complete list of foods and beverages to avoid. Contact your dietitian for more information. This information is not intended to replace advice given to you by your health care provider. Make sure you discuss any questions you have with your health care provider. Document Released: 09/04/2011 Document Revised: 08/11/2015 Document Reviewed: 08/27/2013 Elsevier Interactive Patient Education  [employer]2018 Elsevier Inc.   Thank you for allowing us to provide your care.    We have placed a referral to stomach doctors (gastroenterology).  At their office should be contacting you within the next 2-3 days.  If you have not heard from them within the next couple of days please contact her primary care doctor.    We have attached contact information for [place] [facility].  Please call them to schedule a primary care visit.  If you experience any further vomiting with blood or bloody bowel movements please seek further medical care as soon as  possible.    Please pick up your new prescriptions and avoid alcohol use.

## 2017-03-30 NOTE — Discharge Summary (Signed)
Name: Adrian Alexander MRN: [id number] DOB: 12-30-1960 57 y.o. PCP: Patient, No Pcp Per  Date of Admission: 03/29/2017  2:42 AM Date of Discharge:  Attending Physician: Anne Shutter, MD  Discharge Diagnosis: 1.  Noncardiac chest pain 2.  Hematemesis and hematochezia  Principal Problem:   Chest pain Active Problems:   Gastritis   Hematochezia   Hematemesis  Discharge Medications: Allergies as of 03/30/2017   No Known Allergies     Medication List    STOP taking these medications   amLODipine 10 MG tablet Commonly known as:  NORVASC   carvedilol 25 MG tablet Commonly known as:  COREG   HYDROcodone-acetaminophen 5-325 MG tablet Commonly known as:  NORCO   ibuprofen 600 MG tablet Commonly known as:  ADVIL,MOTRIN     TAKE these medications   cholecalciferol 1000 units tablet Commonly known as:  VITAMIN D Take 2 tablets (2,000 Units total) by mouth daily.   cloNIDine 0.3 MG tablet Commonly known as:  CATAPRES Take 1 tablet (0.3 mg total) by mouth 2 (two) times daily.   cyclobenzaprine 5 MG tablet Commonly known as:  FLEXERIL Take 5 mg by mouth 3 (three) times daily. What changed:  Another medication with the same name was removed. Continue taking this medication, and follow the directions you see here.   gabapentin 400 MG capsule Commonly known as:  NEURONTIN TAKE ONE CAPSULE BY MOUTH THREE TIMES DAILY   hydrochlorothiazide 25 MG tablet Commonly known as:  HYDRODIURIL Take 25 mg by mouth daily. What changed:  Another medication with the same name was removed. Continue taking this medication, and follow the directions you see here.   lisinopril 40 MG tablet Commonly known as:  PRINIVIL,ZESTRIL Take 40 mg by mouth daily.   multivitamin with minerals Tabs tablet Take 1 tablet by mouth daily.   pantoprazole 40 MG tablet Commonly known as:  PROTONIX Take 2 tablets (80 mg total) by mouth 2 (two) times daily.   phenytoin 100 MG ER capsule Commonly  known as:  DILANTIN Take 100 mg by mouth daily.   QUEtiapine 400 MG tablet Commonly known as:  SEROQUEL Take 400 mg by mouth at bedtime. What changed:  Another medication with the same name was removed. Continue taking this medication, and follow the directions you see here.     Disposition and follow-up:   [name] was discharged from [facility] in Stable condition.  At the hospital follow up visit please address:  1.  Hematemesis and hematochezia.  Please discuss whether the patient has been taking his PPI twice daily.  Discussed whether he has followed up with GI or requires another referral.  Discussed whether he has decreased his alcohol use.  2.  Labs / imaging needed at time of follow-up: CBC  3.  Pending labs/ test needing follow-up: None  Follow-up Appointments: Follow-up Information    [facility]. Call.   Contact information: [address] [place] [address] [phone]        Hospital Course by problem list: Principal Problem:   Chest pain Active Problems:   Gastritis   Hematochezia   Hematemesis   Noncardiac chest pain. Adrian Alexander is a 57 year old male with hypertension and alcohol use disorder who presented to the ED with acute onset sharp/squeezing midsternal chest pain that woke him from sleep. EKG illustrated T wave inversion in V3 and V4 that was changed compared to prior EKG in 2016. Troponin was negative  x3. The patient's chest pain was reproducible with palpation of the chest wall. Given the patient's excessive emesis and alcohol intake his chest pain was felt to be more likely to to GI/MSK etiology versus cardiac etiology. Please see below for further recommendations.  Hematemesis and hematochezia. Patient presented with complaints of hematemesis and hematochezia of 4 days duration that began after excessive alcohol intake. He did not experience any further hematemesis or  hematochezia while hospitalized. Hemoglobin remained stable. The patient was not cardiac or hypotensive to suggest severe volume loss. The case was discussed with GI who felt that since the patient's hemoglobin was stable and he had no further episodes that his hematemesis and hematochezia were likely secondary to esophagitis/gastritis. They recommended starting a PPI twice daily and outpatient GI referral for consideration of colonoscopy and endoscopy. GI referral was placed on discharge. The patient was given return precautions and discharged in stable condition.  Discharge Vitals:   BP 120/79 (BP Location: Left Arm)   Pulse (!) 55   Temp 97.7 F (36.5 C) (Oral)   Resp 12   Ht 6\' 1"  (1.854 m)   Wt 214 lb 14.4 oz (97.5 kg)   SpO2 100%   BMI 28.35 kg/m   Discharge Instructions: Discharge Instructions    Ambulatory referral to Gastroenterology   Complete by:  As directed    Call MD for:   Complete by:  As directed    Vomit with blood or bloody bowel movements   Diet - low sodium heart healthy   Complete by:  As directed    Discharge instructions   Complete by:  As directed    We have placed a referral for you to the stomach doctors (gastroenterology).  They should be contacting you within the next few days.  Please follow-up with your primary care doctor as soon as possible.  If you experience any further vomiting with blood or bloody bowel movements please return or seek further medical care as soon as possible.   Increase activity slowly   Complete by:  As directed      Signed: [name], Adrian Lamadrid, MD 03/30/2017, 1:12 PM   My Pager: [phone]

## 2017-03-30 NOTE — Progress Notes (Signed)
Nutrition Brief Note  Patient identified on the Malnutrition Screening Tool (MST) Report  Wt Readings from Last 15 Encounters:  03/30/17 214 lb 14.4 oz (97.5 kg)  06/07/15 219 lb 8 oz (99.6 kg)  02/07/15 208 lb (94.3 kg)  02/02/15 208 lb (94.3 kg)  07/13/12 231 lb (104.8 kg)  10/11/11 240 lb (108.9 kg)  09/02/11 215 lb (97.5 kg)   Adrian Alexander is a 57 year old male with a history of schizoaffective disorder, seizures, and hypertension who presented with chest pain.  Pt admitted with chest pain and hematemesis.   Spoke with pt, who was drowsy at time of visit. He reports good appetite, stating he consumes 3 meals per day. He denies any wt loss recently, but reports he intentionally lost weight prior to the holidays. He estimates UBW is around 235#, however, this is not consistent with wt hx. Documented wt hx reveals wt stability.   Nutrition-Focused physical exam completed. Findings are no fat depletion, no muscle depletion, and no edema.   Pt very eager to eat; diet was advanced after RD visit ("I'm about to starve").   Body mass index is 28.35 kg/m. Patient meets criteria for overweight based on current BMI.   Current diet order is Heart Healthy, patient is consuming approximately n/a% of meals at this time. Labs and medications reviewed.   No nutrition interventions warranted at this time. If nutrition issues arise, please consult RD.   Brian Kocourek A. Mayford KnifeWilliams, RD, LDN, CDE Pager: [phone] After hours Pager: [phone]

## 2017-08-15 DIAGNOSIS — K922 Gastrointestinal hemorrhage, unspecified: Secondary | ICD-10-CM | POA: Insufficient documentation

## 2017-11-07 ENCOUNTER — Emergency Department (HOSPITAL_COMMUNITY)
Admission: EM | Admit: 2017-11-07 | Discharge: 2017-11-07 | Disposition: A | Discharge status: Home / Self Care | Payer: Medicaid Other | Attending: Emergency Medicine | Admitting: Emergency Medicine

## 2017-11-07 ENCOUNTER — Encounter (HOSPITAL_COMMUNITY): Payer: Self-pay

## 2017-11-07 DIAGNOSIS — Z87891 Personal history of nicotine dependence: Secondary | ICD-10-CM | POA: Insufficient documentation

## 2017-11-07 DIAGNOSIS — Z79899 Other long term (current) drug therapy: Secondary | ICD-10-CM | POA: Insufficient documentation

## 2017-11-07 DIAGNOSIS — R4182 Altered mental status, unspecified: Secondary | ICD-10-CM | POA: Diagnosis present

## 2017-11-07 DIAGNOSIS — R404 Transient alteration of awareness: Secondary | ICD-10-CM

## 2017-11-07 DIAGNOSIS — I10 Essential (primary) hypertension: Secondary | ICD-10-CM | POA: Diagnosis not present

## 2017-11-07 LAB — CBC WITH DIFFERENTIAL/PLATELET
Abs Immature Granulocytes: 0 10*3/uL (ref 0.0–0.1)
Basophils Absolute: 0 10*3/uL (ref 0.0–0.1)
Basophils Relative: 0 %
Eosinophils Absolute: 0 10*3/uL (ref 0.0–0.7)
Eosinophils Relative: 1 %
HCT: 41.9 % (ref 39.0–52.0)
Hemoglobin: 13.5 g/dL (ref 13.0–17.0)
Immature Granulocytes: 0 %
Lymphocytes Relative: 24 %
Lymphs Abs: 1.3 10*3/uL (ref 0.7–4.0)
MCH: 26.4 pg (ref 26.0–34.0)
MCHC: 32.2 g/dL (ref 30.0–36.0)
MCV: 82 fL (ref 78.0–100.0)
Monocytes Absolute: 0.6 10*3/uL (ref 0.1–1.0)
Monocytes Relative: 11 %
Neutro Abs: 3.3 10*3/uL (ref 1.7–7.7)
Neutrophils Relative %: 64 %
Platelets: 185 10*3/uL (ref 150–400)
RBC: 5.11 MIL/uL (ref 4.22–5.81)
RDW: 13.5 % (ref 11.5–15.5)
WBC: 5.2 10*3/uL (ref 4.0–10.5)

## 2017-11-07 LAB — COMPREHENSIVE METABOLIC PANEL
ALT: 22 U/L (ref 0–44)
AST: 20 U/L (ref 15–41)
Albumin: 3.4 g/dL — ABNORMAL LOW (ref 3.5–5.0)
Alkaline Phosphatase: 36 U/L — ABNORMAL LOW (ref 38–126)
Anion gap: 7 (ref 5–15)
BUN: 20 mg/dL (ref 6–20)
CO2: 23 mmol/L (ref 22–32)
Calcium: 8.4 mg/dL — ABNORMAL LOW (ref 8.9–10.3)
Chloride: 108 mmol/L (ref 98–111)
Creatinine, Ser: 1.18 mg/dL (ref 0.61–1.24)
GFR calc Af Amer: >60 mL/min (ref >60)
GFR calc non Af Amer: >60 mL/min (ref >60)
Glucose, Bld: 152 mg/dL — ABNORMAL HIGH (ref 70–99)
Potassium: 3.3 mmol/L — ABNORMAL LOW (ref 3.5–5.1)
Sodium: 138 mmol/L (ref 135–145)
Total Bilirubin: 0.7 mg/dL (ref 0.3–1.2)
Total Protein: 5.7 g/dL — ABNORMAL LOW (ref 6.5–8.1)

## 2017-11-07 LAB — I-STAT TROPONIN, ED: Troponin i, poc: 0 ng/mL (ref 0.00–0.08)

## 2017-11-07 LAB — RAPID URINE DRUG SCREEN, HOSP PERFORMED
Amphetamines: NOT DETECTED
Barbiturates: NOT DETECTED
Benzodiazepines: NOT DETECTED
Cocaine: NOT DETECTED
Opiates: NOT DETECTED
Tetrahydrocannabinol: POSITIVE — AB

## 2017-11-07 LAB — LIPASE, BLOOD: Lipase: 33 U/L (ref 11–51)

## 2017-11-07 MED ORDER — SODIUM CHLORIDE 0.9 % IV BOLUS
1000.0000 mL | Freq: Once | INTRAVENOUS | Status: AC
  Administered 2017-11-07: 1000 mL via INTRAVENOUS

## 2017-11-07 NOTE — ED Notes (Signed)
Pt given d/c instructions. Pt was able to ambulate to wheelchair prior to leaving room. Pt did not express any concerns at time of discharge. Pt taken to lobby by this RN.

## 2017-11-07 NOTE — ED Provider Notes (Signed)
[facility] EMERGENCY DEPARTMENT Provider Note   CSN: [id number] Arrival date & time: 11/07/17  1455     History   Chief Complaint Chief Complaint  Patient presents with  . Altered Mental Status    HPI Adrian Alexander is a 57 y.o. male.  HPI Presents via EMS after being found unresponsive. Per the patient was with his girlfriend at a domicile, when he was found to be unresponsive.  When EMS notes that on their arrival the patient was also hypotensive, but did respond to sternal rub and other stimuli. However, he remained hypersomnolent in route. It is unclear if he is taking any additional medication, or any other circumstances about his presentation. On my exam the patient is responsive to stimuli, but falls asleep again quickly, does not interact. Level 5 caveat secondary to altered mental status Past Medical History:  Diagnosis Date  . Anxiety   . Bipolar affective disorder, depressed (HCC)   . Hypertension   . Schizo-affective schizophrenia (HCC)   . Schizophrenia, schizo-affective (HCC)   . Seizure (HCC)   . Seizures (HCC)    Last month. gets a sensation    Patient Active Problem List   Diagnosis Date Noted  . Chest pain 03/29/2017  . Gastritis 03/29/2017  . Hematochezia 03/29/2017  . Hematemesis 03/29/2017  . Alcohol dependence with withdrawal, uncomplicated (HCC) 01/25/2015  . Post traumatic stress disorder (PTSD) 01/25/2015  . Schizoaffective disorder (HCC) 01/25/2015  . Cocaine abuse with cocaine-induced mood disorder (HCC) 01/25/2015  . MDD (major depressive disorder) 01/24/2015  . Substance induced mood disorder (HCC) 09/10/2011    Past Surgical History:  Procedure Laterality Date  . ANKLE SURGERY    . CLOSED REDUCTION WITH HUMER PIN INSERTION     left ankle 1998  . HERNIA REPAIR     while in prison  . HERNIA REPAIR          Home Medications    Prior to Admission medications   Medication Sig Start Date End Date Taking?  Authorizing Provider  cholecalciferol (VITAMIN D) 1000 UNITS tablet Take 2 tablets (2,000 Units total) by mouth daily. Patient not taking: Reported on 03/29/2017 01/27/15   [name], NP  cloNIDine (CATAPRES) 0.3 MG tablet Take 1 tablet (0.3 mg total) by mouth 2 (two) times daily. 02/07/15   Henrietta Hoover, NP  cyclobenzaprine (FLEXERIL) 5 MG tablet Take 5 mg by mouth 3 (three) times daily. 03/14/17   [provider]  gabapentin (NEURONTIN) 400 MG capsule TAKE ONE CAPSULE BY MOUTH THREE TIMES DAILY 05/30/15   Henrietta Hoover, NP  hydrochlorothiazide (HYDRODIURIL) 25 MG tablet Take 25 mg by mouth daily. 03/14/17   [provider]  lisinopril (PRINIVIL,ZESTRIL) 40 MG tablet Take 40 mg by mouth daily. 03/14/17   [provider]  Multiple Vitamin (MULTIVITAMIN WITH MINERALS) TABS tablet Take 1 tablet by mouth daily. Patient not taking: Reported on 03/29/2017 01/27/15   [name], NP  pantoprazole (PROTONIX) 40 MG tablet Take 2 tablets (80 mg total) by mouth 2 (two) times daily. 03/30/17   Levora Dredge, MD  phenytoin (DILANTIN) 100 MG ER capsule Take 100 mg by mouth daily. 03/14/17   [provider]  QUEtiapine (SEROQUEL) 400 MG tablet Take 400 mg by mouth at bedtime. 02/21/17   [provider]    Family History Family History  Problem Relation Age of Onset  . Cancer Mother 80       colon  . Heart disease  Father   . Heart attack Father [id number]    Social History Social History   Tobacco Use  . Smoking status: Former Smoker    Packs/day: 0.10    Years: 20.00    Pack years: 2.00    Types: Cigarettes  . Smokeless tobacco: Current User    Types: Snuff  Substance Use Topics  . Alcohol use: Yes    Alcohol/week: 42.0 standard drinks    Types: 42 Cans of beer per week  . Drug use: Yes    Types: Marijuana, Cocaine     Allergies   Patient has no known allergies.   Review of Systems Review of Systems  Unable to perform ROS:  Mental status change     Physical Exam Updated Vital Signs BP (!) 152/79   Pulse 66   Resp 19   SpO2 98%   Physical Exam  Constitutional: He is oriented to person, place, and time. He appears listless.  HENT:  Head: Normocephalic and atraumatic.  Eyes: Conjunctivae and EOM are normal.  Cardiovascular: Normal rate and regular rhythm.  Pulmonary/Chest: Effort normal. No stridor. No respiratory distress.  Abdominal: He exhibits no distension.  Musculoskeletal: He exhibits no edema.  Neurological: He is oriented to person, place, and time. He appears listless.  Pons to painful stimuli, brief movement of all extremities, but does not meaningfully interact.   Skin: Skin is warm and dry.  Psychiatric: Cognition and memory are impaired.  Nursing note and vitals reviewed.    ED Treatments / Results  Labs (all labs ordered are listed, but only abnormal results are displayed) Labs Reviewed  COMPREHENSIVE METABOLIC PANEL - Abnormal; Notable for the following components:      Result Value   Potassium 3.3 (*)    Glucose, Bld 152 (*)    Calcium 8.4 (*)    Total Protein 5.7 (*)    Albumin 3.4 (*)    Alkaline Phosphatase 36 (*)    All other components within normal limits  CBC WITH DIFFERENTIAL/PLATELET  LIPASE, BLOOD  ETHANOL  RAPID URINE DRUG SCREEN, HOSP PERFORMED  I-STAT TROPONIN, ED    EKG EKG Interpretation  Date/Time:  Thursday November 07 2017 15:16:23 EDT Ventricular Rate:  60 PR Interval:    QRS Duration: 98 QT Interval:  500 QTC Calculation: 500 R Axis:   -45 Text Interpretation:  Sinus rhythm Left atrial enlargement Left anterior fascicular block ST-t wave abnormality Abnormal ekg Confirmed by Gerhard MunchLockwood, Trayden Brandy [id number]) on 11/07/2017 3:36:14 PM   Radiology No results found.  Procedures Procedures (including critical care time)  Medications Ordered in ED Medications  sodium chloride 0.9 % bolus 1,000 mL (1,000 mLs Intravenous New Bag/Given 11/07/17 1615)      Initial Impression / Assessment and Plan / ED Course  I have reviewed the triage vital signs and the nursing notes.  Pertinent labs & imaging results that were available during my care of the patient were reviewed by me and considered in my medical decision making (see chart for details).     Update: Patient much more awake than on arrival, states that he accidentally took some of his medication. He remains somnolent, but awakens much more easily. He denies any suicidal thoughts.   9:17 PM Patient now awake, sitting upright, has eaten a meal tray, is watching television, in no distress. He denies ongoing complaints.  This patient presents after being found hypersomnolent. Patient has multiple medical issues, takes multiple sedatives, and upon awakening denies any suicidal thoughts, states that  he perhaps took too much Seroquel. Patient's valuation are generally reassuring, and after hours of monitoring for arrhythmia, he had no decompensation, and indeed improved. Labs also reassuring. Given this improvement, patient discharged with outpatient primary care follow-up.  Final Clinical Impressions(s) / ED Diagnoses  Altered mental status   Gerhard Munch, MD 11/07/17 2117

## 2017-11-07 NOTE — ED Triage Notes (Signed)
Pt arrived via [facility] from home c/o AMS, girlfriend thinks it was too much Seroquel reported by EMS. EMS gave 1mg  Narcan.  Girlfriend reports pt c/o a headache prior.

## 2017-11-07 NOTE — Discharge Instructions (Addendum)
As discussed, your evaluation today has been largely reassuring.  But, it is important that you monitor your condition carefully, and do not hesitate to return to the ED if you develop new, or concerning changes in your condition. ? ?Otherwise, please follow-up with your physician for appropriate ongoing care. ? ?

## 2018-03-14 ENCOUNTER — Other Ambulatory Visit: Payer: Self-pay | Admitting: Family Medicine

## 2018-03-14 DIAGNOSIS — N5089 Other specified disorders of the male genital organs: Secondary | ICD-10-CM

## 2018-03-18 ENCOUNTER — Emergency Department (HOSPITAL_COMMUNITY): Payer: Medicaid Other

## 2018-03-18 ENCOUNTER — Encounter (HOSPITAL_COMMUNITY): Payer: Self-pay

## 2018-03-18 ENCOUNTER — Emergency Department (HOSPITAL_COMMUNITY)
Admission: EM | Admit: 2018-03-18 | Discharge: 2018-03-18 | Discharge status: Against Medical Advice | Payer: Medicaid Other | Attending: Emergency Medicine | Admitting: Emergency Medicine

## 2018-03-18 DIAGNOSIS — Z5321 Procedure and treatment not carried out due to patient leaving prior to being seen by health care provider: Secondary | ICD-10-CM | POA: Diagnosis not present

## 2018-03-18 DIAGNOSIS — R531 Weakness: Secondary | ICD-10-CM | POA: Diagnosis present

## 2018-03-18 LAB — BASIC METABOLIC PANEL
Anion gap: 11 (ref 5–15)
BUN: 9 mg/dL (ref 6–20)
CO2: 22 mmol/L (ref 22–32)
Calcium: 8.9 mg/dL (ref 8.9–10.3)
Chloride: 105 mmol/L (ref 98–111)
Creatinine, Ser: 0.91 mg/dL (ref 0.61–1.24)
GFR calc Af Amer: >60 mL/min (ref >60)
GFR calc non Af Amer: >60 mL/min (ref >60)
Glucose, Bld: 89 mg/dL (ref 70–99)
Potassium: 3.3 mmol/L — ABNORMAL LOW (ref 3.5–5.1)
Sodium: 138 mmol/L (ref 135–145)

## 2018-03-18 LAB — CBC
HCT: 45.4 % (ref 39.0–52.0)
Hemoglobin: 14.6 g/dL (ref 13.0–17.0)
MCH: 25.1 pg — ABNORMAL LOW (ref 26.0–34.0)
MCHC: 32.2 g/dL (ref 30.0–36.0)
MCV: 78 fL — ABNORMAL LOW (ref 80.0–100.0)
Platelets: 248 10*3/uL (ref 150–400)
RBC: 5.82 MIL/uL — ABNORMAL HIGH (ref 4.22–5.81)
RDW: 13.2 % (ref 11.5–15.5)
WBC: 5.3 10*3/uL (ref 4.0–10.5)
nRBC: 0 % (ref 0.0–0.2)

## 2018-03-18 LAB — I-STAT TROPONIN, ED: Troponin i, poc: 0.01 ng/mL (ref 0.00–0.08)

## 2018-03-18 NOTE — ED Triage Notes (Signed)
Patient complains of general weakness to bilateral arms and legs. States that he has muscular pain to upper legs. Also feels as if experiencing palpitations since lunch, no CP. No neuro deficits, alert and oriented

## 2018-03-18 NOTE — ED Notes (Signed)
Pt states that he is feeling better and wants to leave. Said that he would come back if he feels worse.

## 2018-03-20 ENCOUNTER — Other Ambulatory Visit: Payer: Medicaid Other

## 2018-10-22 ENCOUNTER — Telehealth (HOSPITAL_COMMUNITY): Payer: Self-pay | Admitting: Psychiatry

## 2019-01-21 ENCOUNTER — Other Ambulatory Visit: Payer: Self-pay | Admitting: Internal Medicine

## 2019-01-21 DIAGNOSIS — G8311 Monoplegia of lower limb affecting right dominant side: Secondary | ICD-10-CM

## 2019-01-23 ENCOUNTER — Ambulatory Visit: Payer: Medicaid Other | Attending: Internal Medicine | Admitting: Physical Therapy

## 2019-01-23 ENCOUNTER — Other Ambulatory Visit: Payer: Self-pay

## 2019-01-23 VITALS — BP 149/79 | HR 59

## 2019-01-23 DIAGNOSIS — G8929 Other chronic pain: Secondary | ICD-10-CM | POA: Insufficient documentation

## 2019-01-23 DIAGNOSIS — R2681 Unsteadiness on feet: Secondary | ICD-10-CM | POA: Insufficient documentation

## 2019-01-23 DIAGNOSIS — Z9181 History of falling: Secondary | ICD-10-CM

## 2019-01-23 DIAGNOSIS — R293 Abnormal posture: Secondary | ICD-10-CM | POA: Diagnosis present

## 2019-01-23 DIAGNOSIS — M545 Low back pain, unspecified: Secondary | ICD-10-CM

## 2019-01-23 DIAGNOSIS — R2689 Other abnormalities of gait and mobility: Secondary | ICD-10-CM | POA: Insufficient documentation

## 2019-01-23 NOTE — Therapy (Signed)
[facility] [address] [place], [place], [address] Phone: [phone]   Fax:  [phone]  Physical Therapy Evaluation  Patient Details  Name: Adrian Alexander MRN: [id number] Date of Birth: 05/01/1960 Referring Provider (PT): Corky DownsBakare, [place]obolaji   Encounter Date: 01/23/2019  PT End of Session - 01/23/19 1321    Visit Number  1    Number of Visits  4    Authorization Type  Medicaid - awaiting auth    PT Start Time  1233    PT Stop Time  1315    PT Time Calculation (min)  42 min    Equipment Utilized During Treatment  Gait belt    Activity Tolerance  Patient tolerated treatment well;Patient limited by fatigue    Behavior During Therapy  [facility] for tasks assessed/performed       Past Medical History:  Diagnosis Date  . Anxiety   . Bipolar affective disorder, depressed (HCC)   . Hypertension   . Schizo-affective schizophrenia (HCC)   . Schizophrenia, schizo-affective (HCC)   . Seizure (HCC)   . Seizures (HCC)    Last month. gets a sensation    Past Surgical History:  Procedure Laterality Date  . ANKLE SURGERY    . CLOSED REDUCTION WITH HUMER PIN INSERTION     left ankle 1998  . HERNIA REPAIR     while in prison  . HERNIA REPAIR      Vitals:   01/23/19 1238  BP: (!) 149/79  Pulse: (!) 59     Subjective Assessment - 01/23/19 1239    Subjective  Has been having low back problems for about 10 years. Had shots for his low back, it took away the pain but then it always came back. Started noticing weakness in R leg about one year ago. Also reports on and off weakness in LLE that started about a year ago. Weakness has stayed about the same. R leg feels weak especially when he is walking. States walking has not been too good and has had a couple of falls from being off balance. Is afraid of falling. Has fallen when coming down the stairs, another one was when he was walking and had trouble lifting his right foot up.     Pertinent History  bipolar disorder, HTN, schizophrenia, hx of seizures    How long can you walk comfortably?  5-10 minutes.    Diagnostic tests  MRI: 02/11/19 for lumbar spine (to rule out cord compression - from PCP note)    Patient Stated Goals  wants to improve his walking, loosen up his low back and his legs.    Currently in Pain?  Yes    Pain Score  6     Pain Location  Back    Pain Orientation  --   center low back   Pain Descriptors / Indicators  Sore    Pain Type  Chronic pain    Aggravating Factors   moving around too much.    Pain Relieving Factors  relaxing, getting in a comfortable position.         [facility] Assessment - 01/23/19 1235      Assessment   Medical Diagnosis  Right leg weakness    Referring Provider (PT)  Bakare, Mobolaji    Onset Date/Surgical Date  01/22/18   noticed R leg weakness approx. 1 year ago   Prior Therapy  no previous PT      Precautions   Precautions  Fall      Balance Screen   Has the patient fallen in the past 6 months  Yes    How many times?  2   had to have help to get up   Has the patient had a decrease in activity level because of a fear of falling?   Yes    Is the patient reluctant to leave their home because of a fear of falling?   Yes      Home Environment   Living Environment  Private residence    Living Arrangements  Other (Comment)   [place]   Type of Home  Apartment    Home Access  Stairs to enter   3rd floor of apartment complex   Entrance Stairs-Rails  Can reach both    Home Layout  One level    Home Equipment  None      Prior Function   Level of Independence  Independent    Vocation  On disability    Leisure  likes walking outside.       Sensation   Light Touch  Appears Intact    Additional Comments  has N/T in his B fingertips.       Coordination   Gross Motor Movements are Fluid and Coordinated  No    Heel Shin Test  [facility] on RLE, impaired on LLE      Posture/Postural Control   Posture/Postural  Control  Postural limitations    Postural Limitations  Rounded Shoulders;Forward head   trunk lean L     ROM / Strength   AROM / PROM / Strength  Strength      Strength   Overall Strength Comments  5/5 BUE strength    Strength Assessment Site  Knee;Hip;Ankle    Right/Left Hip  Right;Left    Right Hip Flexion  3+/5    Left Hip Flexion  3+/5    Right/Left Knee  Right;Left    Right Knee Flexion  4+/5    Right Knee Extension  5/5    Left Knee Flexion  4+/5    Left Knee Extension  5/5    Right/Left Ankle  Right;Left    Right Ankle Dorsiflexion  2+/5    Left Ankle Dorsiflexion  2+/5      Transfers   Five time sit to stand comments   20.41    from standard arm chair, no UE support   Comments  increased difficulty during 3rd rep      Ambulation/Gait   Ambulation/Gait  Yes    Ambulation/Gait Assistance  4: Min guard;5: Supervision    Ambulation/Gait Assistance Details  Pt needing to sit down halfway through performing the FGA due to feelings of fatigue and imbalance.    Ambulation Distance (Feet)  150 Feet   approx throughout session   Assistive device  None    Gait Pattern  Step-through pattern;Decreased arm swing - right;Decreased arm swing - left;Decreased stance time - right;Decreased hip/knee flexion - right;Decreased dorsiflexion - right;Decreased dorsiflexion - left;Right foot flat;Left foot flat;Lateral trunk lean to left;Decreased trunk rotation;Poor foot clearance - left;Poor foot clearance - right    Ambulation Surface  Level;Indoor    Gait velocity  13.31 seconds = 2.46 ft/sec    Gait velocity - backwards  30.35 seconds = 1.08 ft/sec    Stairs  Yes    Stairs Assistance  4: Min guard;5: Supervision    Stairs Assistance Details (indicate cue type and reason)  Patient initially performing step  through pattern while descending, educated pt to perform step to while descending to feel more stable to decrease risk of falls.    Stair Management Technique  Two rails;Alternating  pattern;Step to pattern    Number of Stairs  8    Height of Stairs  4    Gait Comments  L trunk lean      Standardized Balance Assessment   Standardized Balance Assessment  Timed Up and Go Test      Timed Up and Go Test   Normal TUG (seconds)  11.53      Functional Gait  Assessment   Gait assessed   Yes    Gait Level Surface  Walks 20 ft, slow speed, abnormal gait pattern, evidence for imbalance or deviates 10-15 in outside of the 12 in walkway width. Requires more than 7 sec to ambulate 20 ft.   9.88 seconds   Change in Gait Speed  Makes only minor adjustments to walking speed, or accomplishes a change in speed with significant gait deviations, deviates 10-15 in outside the 12 in walkway width, or changes speed but loses balance but is able to recover and continue walking.    Gait with Horizontal Head Turns  Performs head turns smoothly with slight change in gait velocity (eg, minor disruption to smooth gait path), deviates 6-10 in outside 12 in walkway width, or uses an assistive device.    Gait with Vertical Head Turns  Performs task with slight change in gait velocity (eg, minor disruption to smooth gait path), deviates 6 - 10 in outside 12 in walkway width or uses assistive device    Gait and Pivot Turn  Pivot turns safely in greater than 3 sec and stops with no loss of balance, or pivot turns safely within 3 sec and stops with mild imbalance, requires small steps to catch balance.    Step Over Obstacle  Is able to step over one shoe box (4.5 in total height) but must slow down and adjust steps to clear box safely. May require verbal cueing.    Gait with Narrow Base of Support  Ambulates less than 4 steps heel to toe or cannot perform without assistance.    Gait with Eyes Closed  Walks 20 ft, slow speed, abnormal gait pattern, evidence for imbalance, deviates 10-15 in outside 12 in walkway width. Requires more than 9 sec to ambulate 20 ft.    Ambulating Backwards  Walks 20 ft, slow speed,  abnormal gait pattern, evidence for imbalance, deviates 10-15 in outside 12 in walkway width.   30.35 seconds   Steps  Two feet to a stair, must use rail.    Total Score  12    FGA comment:  12/30                Objective measurements completed on examination: See above findings.              PT Education - 01/23/19 1321    Education Details  clinical findings, POC.    Person(s) Educated  Patient    Methods  Explanation    Comprehension  Verbalized understanding       PT Short Term Goals - 01/23/19 1334      PT SHORT TERM GOAL #1   Title  Patient will be independent with initial HEP for balance and ROM. ALL STGS DUE AFTER 3RD VISIT.    Baseline  Currently dependent    Status  New      PT  SHORT TERM GOAL #2   Title  Patient will decrease 5x sit <> stand time to 17 seconds or less without BUE support from lower arm chair to demo improved LE strength.    Baseline  20.21 seconds from standard height chair without UE support on 01/23/19    Status  New      PT SHORT TERM GOAL #3   Title  Patient will improve gait speed with no AD to at least 2.80 ft/sec to demonstrate improved community mobility.    Baseline  2.46 ft/sec no AD on 01/23/19    Status  New      PT SHORT TERM GOAL #4   Title  Patient will improve FGA score to at least a 16/30 to demo decreased fall risk.    Baseline  12/30 on 01/23/19    Status  New      PT SHORT TERM GOAL #5   Title  Patient will report worse back pain to 4/10 or less in order to improve functional mobility.    Baseline  6/10 on 01/23/19    Status  New            PT Long Term Goals - 01/23/19 1349      PT LONG TERM GOAL #1   Title  Patient will be independent with final HEP for balance and ROM. ALL LTGS DUE AFTER 9TH VISIT.    Baseline  currently dependent with HEP.    Status  New      PT LONG TERM GOAL #2   Title  Patient will ambulate at least 10' with supervision and no LOB while scanning environment in order to  demo improved community mobility.    Baseline  not yet assessed.    Status  New      PT LONG TERM GOAL #3   Title  Patient will decrease 5x sit <> stand time to 15 seconds or less without BUE support from lower arm chair to demo improved LE strength.    Baseline  20.21 seconds from standard height chair without UE support on 01/23/19    Status  New      PT LONG TERM GOAL #4   Title  Patient will improve FGA score to at least a 19/30 to demo decreased fall risk.    Baseline  12/30 on 01/23/19    Status  New      PT LONG TERM GOAL #5   Title  Patient will improve gait speed with no AD to at least 3.4 ft/sec to demonstrate improved community mobility.    Baseline  2.46 ft/sec no AD on 01/23/19    Status  New      Additional Long Term Goals   Additional Long Term Goals  Yes      PT LONG TERM GOAL #6   Title  Patient will perform at least 12 steps with mod I with step to pattern vs step through ascending/descending with BUE on handrails in order to safely perform a flight of stairs at his apartment.    Baseline  8 steps with BUE support and supervision/min guard with step to pattern.    Status  New            Plan - 01/23/19 1322    Clinical Impression Statement  Patient is a 58 year old male referred to Neuro OPPT for evaluation with primary concern of right > left leg weakness, balance impairments, and low back pain. Pt reported starting to  notice right leg weakness approximately a year ago and on and off left leg weakness. Saw PCP on 01/19/19 who ordered an MRI (for 11/25) of lumbar spine to rule out cord compression.   Pt's PMH is significant for: bipolar disorder, HTN, schizophrenia, hx of seizures. The following deficits were present during the exam: trunk lean to L and postural deficits, decreased LE strength, gait abnormalities, impaired dynamic balance, decreased endurance due to pain and imbalance. Pt's FGA scores indicate pt is at a high risk for falls and gait speed indicates  patient is a limited community ambulator.  Pt would benefit from skilled PT to address these impairments and functional limitations to maximize functional mobility independence    Personal Factors and Comorbidities  Past/Current Experience;Time since onset of injury/illness/exacerbation    Examination-Activity Limitations  Locomotion Level;Stand;Stairs;Squat    Examination-Participation Restrictions  Community Activity   outdoor activities   Stability/Clinical Decision Making  Evolving/Moderate complexity    Clinical Decision Making  Moderate    Rehab Potential  Fair    PT Frequency  1x / week   initial 3 Medicaid visits, followed by 2x week for 3 weeks   PT Duration  3 weeks   initial 3 Medicaid visits, followed by 2x week for 3 weeks   PT Treatment/Interventions  ADLs/Self Care Home Management;Aquatic Therapy;Therapeutic exercise;Therapeutic activities;Functional mobility training;Stair training;Gait training;DME Instruction;Balance training;Neuromuscular re-education;Patient/family education;Energy conservation    PT Next Visit Plan  stair training. initial HEP to see if it will help balance/any stretches for low back. look at lumbar ROM. gait training with [facility] for safety?    Consulted and Agree with Plan of Care  Patient       .    Patient will benefit from skilled therapeutic intervention in order to improve the following deficits and impairments:  Abnormal gait, Decreased activity tolerance, Decreased balance, Decreased coordination, Difficulty walking, Decreased strength, Postural dysfunction, Pain, Decreased mobility  Visit Diagnosis: Other abnormalities of gait and mobility  Chronic bilateral low back pain without sciatica  History of falling  Unsteadiness on feet  Abnormal posture     Problem List Patient Active Problem List   Diagnosis Date Noted  . Chest pain 03/29/2017  . Gastritis 03/29/2017  . Hematochezia 03/29/2017  . Hematemesis 03/29/2017  . Alcohol  dependence with withdrawal, uncomplicated (HCC) 01/25/2015  . Post traumatic stress disorder (PTSD) 01/25/2015  . Schizoaffective disorder (HCC) 01/25/2015  . Cocaine abuse with cocaine-induced mood disorder (HCC) 01/25/2015  . MDD (major depressive disorder) 01/24/2015  . Substance induced mood disorder (HCC) 09/10/2011    Drake Leach, PT, DPT  01/23/2019, 1:48 PM  [facility] [address] [place], [place], [address] Phone: [phone]   Fax:  [phone]  Name: Adrian Alexander MRN: [id number] Date of Birth: [birth year]

## 2019-02-02 ENCOUNTER — Other Ambulatory Visit: Payer: Self-pay | Admitting: Internal Medicine

## 2019-02-02 DIAGNOSIS — B182 Chronic viral hepatitis C: Secondary | ICD-10-CM

## 2019-02-03 ENCOUNTER — Ambulatory Visit: Payer: Medicaid Other | Admitting: Physical Therapy

## 2019-02-09 ENCOUNTER — Other Ambulatory Visit: Payer: Medicaid Other

## 2019-02-10 ENCOUNTER — Ambulatory Visit
Admission: RE | Admit: 2019-02-10 | Discharge: 2019-02-10 | Disposition: A | Discharge status: Home / Self Care | Payer: Medicaid Other | Source: Ambulatory Visit | Attending: Internal Medicine | Admitting: Internal Medicine

## 2019-02-10 ENCOUNTER — Ambulatory Visit: Payer: Medicaid Other | Admitting: Physical Therapy

## 2019-02-10 ENCOUNTER — Other Ambulatory Visit: Payer: Self-pay

## 2019-02-10 DIAGNOSIS — B182 Chronic viral hepatitis C: Secondary | ICD-10-CM

## 2019-02-10 DIAGNOSIS — R2689 Other abnormalities of gait and mobility: Secondary | ICD-10-CM | POA: Diagnosis not present

## 2019-02-10 DIAGNOSIS — M545 Low back pain, unspecified: Secondary | ICD-10-CM

## 2019-02-10 DIAGNOSIS — G8929 Other chronic pain: Secondary | ICD-10-CM

## 2019-02-10 DIAGNOSIS — R293 Abnormal posture: Secondary | ICD-10-CM

## 2019-02-10 DIAGNOSIS — Z9181 History of falling: Secondary | ICD-10-CM

## 2019-02-10 DIAGNOSIS — R2681 Unsteadiness on feet: Secondary | ICD-10-CM

## 2019-02-10 NOTE — Therapy (Signed)
[facility] [address] [place], [place], [address] Phone: [phone]   Fax:  [phone]  Physical Therapy Treatment  Patient Details  Name: Adrian Alexander MRN: [id number] Date of Birth: Jul 28, 1960 Referring Provider (PT): Corky Downs, [place]   Encounter Date: 02/10/2019  PT End of Session - 02/10/19 1719    Visit Number  2    Number of Visits  4    Authorization Type  Medicaid - 3 visits approaved until 12/7    Authorization - Visit Number  1    Authorization - Number of Visits  3    PT Start Time  1100    PT Stop Time  1145    PT Time Calculation (min)  45 min    Equipment Utilized During Treatment  Gait belt    Activity Tolerance  Patient tolerated treatment well;Patient limited by fatigue    Behavior During Therapy  [facility] for tasks assessed/performed       Past Medical History:  Diagnosis Date  . Anxiety   . Bipolar affective disorder, depressed (HCC)   . Hypertension   . Schizo-affective schizophrenia (HCC)   . Schizophrenia, schizo-affective (HCC)   . Seizure (HCC)   . Seizures (HCC)    Last month. gets a sensation    Past Surgical History:  Procedure Laterality Date  . ANKLE SURGERY    . CLOSED REDUCTION WITH HUMER PIN INSERTION     left ankle 1998  . HERNIA REPAIR     while in prison  . HERNIA REPAIR      There were no vitals filed for this visit.  Subjective Assessment - 02/10/19 1718    Subjective  relays his Rt leg feels weak but not much pain today         Exercises  Nu step warm up 5 min LE/UE X 5 min Supine Lower Trunk Rotation - 10 reps - 1 sets - 5 sec hold  Hooklying Single Knee to Chest - 3 reps - 1 sets - 30 hold Supine Active Straight Leg Raise - 10 reps - 1 set bilat Supine Bridge - 10 reps - 1 set Sit to Stand, 2 sets of 5  Side Stepping at Counter no UE Support - 3 reps Retrowalking at counter top 3 reps no UE support Standing Tandem Balance with Unilateral Counter  Support - 2 sets of 30 sec hold bilat Standing Row with Anchored Resistance - green X 20 reps Shoulder extension with resistance green X 15 reps manual therapy- 5 min of bilat long axis distraction (reported some pain relief) PT Education - 02/10/19 1719    Education Details  HEP    Person(s) Educated  Patient    Methods  Explanation;Demonstration;Verbal cues;Handout    Comprehension  Verbalized understanding       PT Short Term Goals - 01/23/19 1334      PT SHORT TERM GOAL #1   Title  Patient will be independent with initial HEP for balance and ROM. ALL STGS DUE AFTER 3RD VISIT.    Baseline  Currently dependent    Status  New      PT SHORT TERM GOAL #2   Title  Patient will decrease 5x sit <> stand time to 17 seconds or less without BUE support from lower arm chair to demo improved LE strength.    Baseline  20.21 seconds from standard height chair without UE support on 01/23/19    Status  New  PT SHORT TERM GOAL #3   Title  Patient will improve gait speed with no AD to at least 2.80 ft/sec to demonstrate improved community mobility.    Baseline  2.46 ft/sec no AD on 01/23/19    Status  New      PT SHORT TERM GOAL #4   Title  Patient will improve FGA score to at least a 16/30 to demo decreased fall risk.    Baseline  12/30 on 01/23/19    Status  New      PT SHORT TERM GOAL #5   Title  Patient will report worse back pain to 4/10 or less in order to improve functional mobility.    Baseline  6/10 on 01/23/19    Status  New        PT Long Term Goals - 01/23/19 1349      PT LONG TERM GOAL #1   Title  Patient will be independent with final HEP for balance and ROM. ALL LTGS DUE AFTER 9TH VISIT.    Baseline  currently dependent with HEP.    Status  New      PT LONG TERM GOAL #2   Title  Patient will ambulate at least 70' with supervision and no LOB while scanning environment in order to demo improved community mobility.    Baseline  not yet assessed.    Status  New       PT LONG TERM GOAL #3   Title  Patient will decrease 5x sit <> stand time to 15 seconds or less without BUE support from lower arm chair to demo improved LE strength.    Baseline  20.21 seconds from standard height chair without UE support on 01/23/19    Status  New      PT LONG TERM GOAL #4   Title  Patient will improve FGA score to at least a 19/30 to demo decreased fall risk.    Baseline  12/30 on 01/23/19    Status  New      PT LONG TERM GOAL #5   Title  Patient will improve gait speed with no AD to at least 3.4 ft/sec to demonstrate improved community mobility.    Baseline  2.46 ft/sec no AD on 01/23/19    Status  New      Additional Long Term Goals   Additional Long Term Goals  Yes      PT LONG TERM GOAL #6   Title  Patient will perform at least 12 steps with mod I with step to pattern vs step through ascending/descending with BUE on handrails in order to safely perform a flight of stairs at his apartment.    Baseline  8 steps with BUE support and supervision/min guard with step to pattern.    Status  New            Plan - 02/10/19 1720    Clinical Impression Statement  Session focusued on establishing and implementing HEP for lumbar/LE strength, stretching and balance. He was able to show good understanding and return demonstration. He will have MRI tommorow. Continue POC and check for MRI results next session    Personal Factors and Comorbidities  Past/Current Experience;Time since onset of injury/illness/exacerbation    Examination-Activity Limitations  Locomotion Level;Stand;Stairs;Squat    Examination-Participation Restrictions  Community Activity   outdoor activities   Stability/Clinical Decision Making  Evolving/Moderate complexity    Rehab Potential  Fair    PT Frequency  1x / week  initial 3 Medicaid visits, followed by 2x week for 3 weeks   PT Duration  3 weeks   initial 3 Medicaid visits, followed by 2x week for 3 weeks   PT Treatment/Interventions  ADLs/Self  Care Home Management;Aquatic Therapy;Therapeutic exercise;Therapeutic activities;Functional mobility training;Stair training;Gait training;DME Instruction;Balance training;Neuromuscular re-education;Patient/family education;Energy conservation    PT Next Visit Plan  how was HEP, what are MRI results?. perform BERG. stair training. [facility]?    Consulted and Agree with Plan of Care  Patient       Patient will benefit from skilled therapeutic intervention in order to improve the following deficits and impairments:  Abnormal gait, Decreased activity tolerance, Decreased balance, Decreased coordination, Difficulty walking, Decreased strength, Postural dysfunction, Pain, Decreased mobility  Visit Diagnosis: Other abnormalities of gait and mobility  Chronic bilateral low back pain without sciatica  History of falling  Unsteadiness on feet  Abnormal posture     Problem List Patient Active Problem List   Diagnosis Date Noted  . Chest pain 03/29/2017  . Gastritis 03/29/2017  . Hematochezia 03/29/2017  . Hematemesis 03/29/2017  . Alcohol dependence with withdrawal, uncomplicated (HCC) 01/25/2015  . Post traumatic stress disorder (PTSD) 01/25/2015  . Schizoaffective disorder (HCC) 01/25/2015  . Cocaine abuse with cocaine-induced mood disorder (HCC) 01/25/2015  . MDD (major depressive disorder) 01/24/2015  . Substance induced mood disorder (HCC) 09/10/2011    April MansonBrian R Nelson ,PT,DPT 02/10/2019, 5:23 PM   [facility] [address] [place], [place], [address] Phone: [phone]   Fax:  [phone]  Name: Adrian Alexander MRN: [id number] Date of Birth: 04/14/1960

## 2019-02-10 NOTE — Patient Instructions (Signed)
Access Code: [email or web address]  URL: [email or web address]  Date: 02/10/2019  Prepared by: Elsie Ra   Exercises  Supine Lower Trunk Rotation - 10 reps - 1 sets - 5 sec hold - 2x daily - 6x weekly  Hooklying Single Knee to Chest - 3 reps - 1 sets - 30 hold - 2x daily - 6x weekly  Supine Active Straight Leg Raise - 10 reps - 1-3 sets - 2x daily - 6x weekly  Supine Bridge - 10 reps - 1-2 sets - 5 hold - 2x daily - 6x weekly  Sit to Stand - 10 reps - 1-2 sets - 2x daily - 6x weekly  Side Stepping with Counter Support - 3-5 reps - 1 sets - 2x daily - 6x weekly  Standing Tandem Balance with Unilateral Counter Support - 3 sets - 30 hold - 2x daily - 6x weekly  Standing Row with Anchored Resistance - 10-20 reps - 2-3 sets - 2x daily - 6x weekly  Shoulder extension with resistance - Neutral - 10 reps - 2-3 sets - 2x daily - 6x weekly

## 2019-02-11 ENCOUNTER — Other Ambulatory Visit: Payer: Medicaid Other

## 2019-02-17 ENCOUNTER — Other Ambulatory Visit: Payer: Self-pay

## 2019-02-17 ENCOUNTER — Encounter: Payer: Self-pay | Admitting: Physical Therapy

## 2019-02-17 ENCOUNTER — Ambulatory Visit: Payer: Medicaid Other | Attending: Internal Medicine | Admitting: Physical Therapy

## 2019-02-17 DIAGNOSIS — R2689 Other abnormalities of gait and mobility: Secondary | ICD-10-CM

## 2019-02-17 DIAGNOSIS — R2681 Unsteadiness on feet: Secondary | ICD-10-CM

## 2019-02-17 DIAGNOSIS — Z9181 History of falling: Secondary | ICD-10-CM | POA: Diagnosis present

## 2019-02-17 NOTE — Patient Instructions (Signed)
Access Code: [email or web address]  URL: [email or web address]  Date: 02/17/2019  Prepared by: Janann August   Exercises Supine Lower Trunk Rotation - 10 reps - 1 sets - 5 sec hold - 2x daily - 6x weekly Hooklying Single Knee to Chest - 3 reps - 1 sets - 30 hold - 2x daily - 6x weekly Supine Active Straight Leg Raise - 10 reps - 1-3 sets - 2x daily - 6x weekly Supine Bridge - 10 reps - 1-2 sets - 5 hold - 2x daily - 6x weekly Sit to Stand - 10 reps - 1-2 sets - 2x daily - 6x weekly Side Stepping with Counter Support - 3-5 reps - 1 sets - 2x daily - 6x weekly Standing Tandem Balance with Unilateral Counter Support - 3 sets - 30 hold - 2x daily - 6x weekly Standing Row with Anchored Resistance - 10-20 reps - 2-3 sets - 2x daily - 6x weekly Shoulder extension with resistance - Neutral - 10 reps - 2-3 sets - 2x daily - 6x weekly Seated Flexion Stretch - 10 reps - 2 sets - 1x daily - 7x weekly Seated Pelvic Tilt - 10 reps - 2 sets - 1x daily - 7x weekly

## 2019-02-17 NOTE — Therapy (Addendum)
[facility] [address] [place], [place], [address] Phone: [phone]   Fax:  [phone]  Physical Therapy Treatment  Patient Details  Name: Adrian Alexander MRN: [id number] Date of Birth: Feb 03, 1961 Referring Provider (PT): Latanya Presser   Encounter Date: 02/17/2019   02/17/19 1157  PT Visits / Re-Eval  Visit Number 3  Number of Visits 7 (awaiting auth for 2x week for 2 weeks)  Authorization  Authorization Type Medicaid - 3 visits approaved until 12/7, awaiting auth for Medicaid visits  Authorization - Visit Number 2  Authorization - Number of Visits 3  PT Time Calculation  PT Start Time 1104  PT Stop Time 1144  PT Time Calculation (min) 40 min  PT - End of Session  Equipment Utilized During Treatment Gait belt  Activity Tolerance Patient tolerated treatment well;Patient limited by fatigue  Behavior During Therapy [facility] for tasks assessed/performed    Past Medical History:  Diagnosis Date  . Anxiety   . Bipolar affective disorder, depressed ([place])   . Hypertension   . Schizo-affective schizophrenia ([place])   . Schizophrenia, schizo-affective ([place])   . Seizure ([place])   . Seizures ([place])    Last month. gets a sensation    Past Surgical History:  Procedure Laterality Date  . ANKLE SURGERY    . CLOSED REDUCTION WITH HUMER PIN INSERTION     left ankle 1998  . HERNIA REPAIR     while in prison  . HERNIA REPAIR      There were no vitals filed for this visit.  Subjective Assessment - 02/17/19 1107    Subjective  Walks in with his cane today. Has not yet been able to get the MRI done. Feels like both legs are getting heavy.    Pertinent History  bipolar disorder, HTN, schizophrenia, hx of seizures    How long can you walk comfortably?  5-10 minutes.    Diagnostic tests  MRI: 02/11/19 for lumbar spine (to rule out cord compression - from PCP note)    Patient Stated Goals  wants to improve his walking,  loosen up his low back and his legs.    Currently in Pain?  Yes    Pain Score  7     Pain Location  Back    Pain Orientation  --   center low back   Pain Descriptors / Indicators  Aching    Pain Type  Chronic pain         [facility] PT Assessment - 02/17/19 1118      Functional Gait  Assessment   Gait assessed   Yes    Gait Level Surface  Walks 20 ft, slow speed, abnormal gait pattern, evidence for imbalance or deviates 10-15 in outside of the 12 in walkway width. Requires more than 7 sec to ambulate 20 ft.    Change in Gait Speed  Makes only minor adjustments to walking speed, or accomplishes a change in speed with significant gait deviations, deviates 10-15 in outside the 12 in walkway width, or changes speed but loses balance but is able to recover and continue walking.    Gait with Horizontal Head Turns  Performs head turns smoothly with slight change in gait velocity (eg, minor disruption to smooth gait path), deviates 6-10 in outside 12 in walkway width, or uses an assistive device.    Gait with Vertical Head Turns  Performs task with slight change in gait velocity (eg, minor disruption to smooth gait path), deviates  6 - 10 in outside 12 in walkway width or uses assistive device    Gait and Pivot Turn  Pivot turns safely in greater than 3 sec and stops with no loss of balance, or pivot turns safely within 3 sec and stops with mild imbalance, requires small steps to catch balance.    Step Over Obstacle  Is able to step over one shoe box (4.5 in total height) but must slow down and adjust steps to clear box safely. May require verbal cueing.    Gait with Narrow Base of Support  Ambulates less than 4 steps heel to toe or cannot perform without assistance.    Gait with Eyes Closed  Walks 20 ft, slow speed, abnormal gait pattern, evidence for imbalance, deviates 10-15 in outside 12 in walkway width. Requires more than 9 sec to ambulate 20 ft.   12.94 seconds   Ambulating Backwards  Walks 20 ft,  slow speed, abnormal gait pattern, evidence for imbalance, deviates 10-15 in outside 12 in walkway width.    Steps  Two feet to a stair, must use rail.    Total Score  12    FGA comment:  12/30                   [facility] Adult PT Treatment/Exercise - 02/17/19 1118      Transfers   Comments  2 x 5 reps sit to stands from arm chair with no UE support      Ambulation/Gait   Ambulation/Gait  Yes    Ambulation/Gait Assistance  5: Supervision    Ambulation/Gait Assistance Details  pt fatigued after 115     Ambulation Distance (Feet)  150 Feet   approx throughout session   Assistive device  Straight cane    Gait Pattern  Step-through pattern;Decreased arm swing - right;Decreased arm swing - left;Decreased stance time - right;Decreased hip/knee flexion - right;Decreased dorsiflexion - right;Decreased dorsiflexion - left;Right foot flat;Left foot flat;Lateral trunk lean to left;Decreased trunk rotation;Poor foot clearance - left;Poor foot clearance - right    Ambulation Surface  Level;Indoor    Gait velocity  SPC 15.66 seconds = 2.09 ft/sec      Neuro Re-ed    Neuro Re-ed Details   Standing at [place] with single UE support staggered stance weight shifting with UE reach and forward flexion with hamstring stretch 1 x 10 reps, with single UE support 2 x 5 reps B wide BOS lateral weight shifting with reaching with UE towards cabinets            Access Code: [email or web address]  URL: [email or web address]  Date: 02/17/2019  Prepared by: Janann August   Exercises Supine Lower Trunk Rotation - 10 reps - 1 sets - 5 sec hold - 2x daily - 6x weekly Hooklying Single Knee to Chest - 3 reps - 1 sets - 30 hold - 2x daily - 6x weekly Supine Active Straight Leg Raise - 10 reps - 1-3 sets - 2x daily - 6x weekly Supine Bridge - 10 reps - 1-2 sets - 5 hold - 2x daily - 6x weekly Sit to Stand - 10 reps - 1-2 sets - 2x daily - 6x weekly Side Stepping with Counter Support - 3-5 reps - 1  sets - 2x daily - 6x weekly Standing Tandem Balance with Unilateral Counter Support - 3 sets - 30 hold - 2x daily - 6x weekly Standing Row with Anchored Resistance - 10-20 reps - 2-3 sets - 2x daily - 6x  weekly Shoulder extension with resistance - Neutral - 10 reps - 2-3 sets - 2x daily - 6x weekly  New additions to HEP:  Seated Flexion Stretch - 10 reps - 2 sets - 1x daily - 7x weekly  Seated Pelvic Tilt - 10 reps - 2 sets - 1x daily - 7x weekly   PT Short Term Goals - 02/17/19 1109      PT SHORT TERM GOAL #1   Title  Patient will be independent with initial HEP for balance and ROM. ALL STGS DUE AFTER 3RD VISIT.    Baseline  pt reports that he has been performing his exercises at home.    Status  Achieved      PT SHORT TERM GOAL #2   Title  Patient will decrease 5x sit <> stand time to 17 seconds or less without BUE support from lower arm chair to demo improved LE strength.    Baseline  19 seconds from standard height chair without UE support on 02/17/19 (previously 20.21 seconds)    Status  Not Met      PT SHORT TERM GOAL #3   Title  Patient will improve gait speed with no AD to at least 2.80 ft/sec to demonstrate improved community mobility.    Baseline  with SPC 15.66 seconds = 2.09 ft/sec (previously 2.46 ft/sec on 01/23/19)    Status  Not Met      PT SHORT TERM GOAL #4   Title  Patient will improve FGA score to at least a 16/30 to demo decreased fall risk.    Baseline  12/30 on 02/17/19 (previously 12/30 on 01/23/19)    Status  Not Met      PT SHORT TERM GOAL #5   Title  Patient will report worse back pain to 4/10 or less in order to improve functional mobility.    Baseline  7/10 on 02/17/19    Status  Not Met        PT Long Term Goals - 01/23/19 1349      PT LONG TERM GOAL #1   Title  Patient will be independent with final HEP for balance and ROM. ALL LTGS DUE AFTER 9TH VISIT.    Baseline  currently dependent with HEP.    Status  New      PT LONG TERM GOAL #2   Title   Patient will ambulate at least 23' with supervision and no LOB while scanning environment in order to demo improved community mobility.    Baseline  not yet assessed.    Status  New      PT LONG TERM GOAL #3   Title  Patient will decrease 5x sit <> stand time to 15 seconds or less without BUE support from lower arm chair to demo improved LE strength.    Baseline  20.21 seconds from standard height chair without UE support on 01/23/19    Status  New      PT LONG TERM GOAL #4   Title  Patient will improve FGA score to at least a 19/30 to demo decreased fall risk.    Baseline  12/30 on 01/23/19    Status  New      PT LONG TERM GOAL #5   Title  Patient will improve gait speed with no AD to at least 3.4 ft/sec to demonstrate improved community mobility.    Baseline  2.46 ft/sec no AD on 01/23/19    Status  New  Additional Long Term Goals   Additional Long Term Goals  Yes      PT LONG TERM GOAL #6   Title  Patient will perform at least 12 steps with mod I with step to pattern vs step through ascending/descending with BUE on handrails in order to safely perform a flight of stairs at his apartment.    Baseline  8 steps with BUE support and supervision/min guard with step to pattern.    Status  New         Revised LTGs for Medicaid re-auth: PT Long Term Goals - 02/18/19 0916      PT LONG TERM GOAL #1   Title  Patient will be independent with final HEP for balance and ROM. ALL LTGS DUE AFTER 7TH VISIT.    Baseline  ongoing with HEP    Status  On-going      PT LONG TERM GOAL #2   Title  Patient will ambulate at least 13' with supervision and no LOB with SPC while scanning environment in order to demo improved community mobility.    Baseline  not yet assessed.    Status  New      PT LONG TERM GOAL #3   Title  Patient will decrease 5x sit <> stand time to 17 seconds or less without BUE support from lower arm chair to demo improved LE strength.    Baseline  19 seconds from  standard height chair without UE support on 02/17/19    Status  New      PT LONG TERM GOAL #4   Title  Patient will improve baseline BERG score by at least 4 points in order to demo decreased fall risk.    Baseline  not yet assessed.    Status  New      PT LONG TERM GOAL #5   Title  Patient will improve gait speed with no AD to at least 2.7 ft/sec with [facility] to demonstrate improved community mobility.    Baseline  2.09 ft/sec with SPC on 02/17/19    Status  New      PT LONG TERM GOAL #6   Title  Patient will perform at least 12 steps with mod I with step to pattern vs step through ascending/descending with BUE on handrails in order to safely perform a flight of stairs at his apartment.    Baseline  8 steps with BUE support and supervision/min guard with step to pattern.    Status  On-going          02/18/19 0914  Plan  Clinical Impression Statement Focus of today's skilled session was assessing pt's STGs for Medicaid re-auth. Pt has met 1 out of 5 STGs in regards to performing HEP. Pt did not meet goals in regards to back pain, FGA score, gait speed and 5x sit <> stand. Pt's gait speed decreased to 2.09 ft/sec using SPC, putting him in the limited community ambulatory category (previously 2.46 ft/sec with no AD). Pt's FGA score did not change from a 12/30 putting pt at a high risk for falls. Pt's worse low back pain remains approximately the same at a 6-7/10, however he does reports that the stretches are helping. Needing frequent seated rest breaks throughout session today due to reports of increased fatigue and low back stiffness. Pt still waiting on approval from insurance for MRI of low back. Revised pt's LTGs as appropriate. Will ask for 2x week for 2 weeks for remainder of December to address  impairments listed below.  Personal Factors and Comorbidities Past/Current Experience;Time since onset of injury/illness/exacerbation  Examination-Activity Limitations Locomotion  Level;Stand;Stairs;Squat  Examination-Participation Restrictions Community Activity (outdoor activities)  Pt will benefit from skilled therapeutic intervention in order to improve on the following deficits Abnormal gait;Decreased activity tolerance;Decreased balance;Decreased coordination;Difficulty walking;Decreased strength;Postural dysfunction;Pain;Decreased mobility  Stability/Clinical Decision Making Evolving/Moderate complexity  Rehab Potential Fair  PT Frequency 2x / week   PT Duration 2 weeks   PT Treatment/Interventions ADLs/Self Care Home Management;Aquatic Therapy;Therapeutic exercise;Therapeutic activities;Functional mobility training;Stair training;Gait training;DME Instruction;Balance training;Neuromuscular re-education;Patient/family education;Energy conservation  PT Next Visit Plan what are MRI results? how is HEP? try performing BERG. balance, gait training with [place] and Agree with Plan of Care Patient      Patient will benefit from skilled therapeutic intervention in order to improve the following deficits and impairments:     Visit Diagnosis: Other abnormalities of gait and mobility  History of falling  Unsteadiness on feet     Problem List Patient Active Problem List   Diagnosis Date Noted  . Chest pain 03/29/2017  . Gastritis 03/29/2017  . Hematochezia 03/29/2017  . Hematemesis 03/29/2017  . Alcohol dependence with withdrawal, uncomplicated ([place]) 04/88/8916  . Post traumatic stress disorder (PTSD) 01/25/2015  . Schizoaffective disorder ([place]) 01/25/2015  . Cocaine abuse with cocaine-induced mood disorder ([place]) 01/25/2015  . MDD (major depressive disorder) 01/24/2015  . Substance induced mood disorder ([place]) 09/10/2011    Arliss Journey, PT, DPT  02/17/2019, 12:04 PM  [facility] [address] [place], [place], [address] Phone: [phone]   Fax:   [phone]  Name: Adrian Alexander MRN: [id number] Date of Birth: 19-Sep-1960

## 2019-02-26 ENCOUNTER — Other Ambulatory Visit: Payer: Self-pay | Admitting: Internal Medicine

## 2019-02-26 ENCOUNTER — Other Ambulatory Visit: Payer: Self-pay

## 2019-02-26 ENCOUNTER — Ambulatory Visit
Admission: RE | Admit: 2019-02-26 | Discharge: 2019-02-26 | Disposition: A | Discharge status: Home / Self Care | Payer: Medicaid Other | Source: Ambulatory Visit | Attending: Internal Medicine | Admitting: Internal Medicine

## 2019-02-26 DIAGNOSIS — M5440 Lumbago with sciatica, unspecified side: Secondary | ICD-10-CM

## 2019-03-04 ENCOUNTER — Ambulatory Visit: Payer: Medicaid Other | Admitting: Podiatry

## 2019-03-05 ENCOUNTER — Other Ambulatory Visit: Payer: Self-pay

## 2019-03-05 ENCOUNTER — Ambulatory Visit: Payer: Medicaid Other | Admitting: Physical Therapy

## 2019-03-05 VITALS — BP 180/115 | HR 67

## 2019-03-05 NOTE — Patient Instructions (Signed)
Warning Signs of a Stroke  A stroke is a medical emergency and should be treated right away-every second counts. A stroke is caused by a decrease or block in blood flow to the brain. When this occurs, certain areas of the brain do not get enough oxygen, and brain cells begin to die. A stroke can lead to brain damage and can sometimes be life-threatening. However, if someone having a stroke gets medical treatment right away, he or she has better chances of surviving and recovering from the stroke. Being able to recognize the symptoms of a stroke is very important. Types of strokes There are two main types of strokes:  Ischemic strokes. This is the most common type of stroke. These strokes happen when a blood vessel that supplies blood to the brain is being blocked.  Hemorrhagic strokes. These strokes result from bleeding in the brain due to a blood vessel leaking or bursting (rupturing). A transient ischemic attack (TIA) is a "warning stroke" that causes stroke-like symptoms that go away quickly. Unlike a stroke, a TIA does not cause permanent damage to the brain. However, the symptoms of a TIA are the same as a stroke, and they also require medical treatment right away. Having a TIA is a sign that you are at higher risk for a permanent stroke. Warning signs of a stroke The symptoms of stroke may vary and will reflect the part of the brain that is involved. Symptoms usually happen suddenly. "BE FAST" is an easy way to remember the main warning signs of a stroke. B - Balance Signs are dizziness, sudden trouble walking, or loss of balance. E - Eyes Signs are trouble seeing or a sudden change in vision. F - Face Signs are sudden weakness or numbness of the face, or the face or eyelid drooping on one side. A - Arms Signs are weakness or numbness in an arm. This happens suddenly and usually on one side of the body. S - Speech Signs are sudden trouble speaking, slurred speech, or trouble understanding  what people say. T - Time Time to call emergency services. Write down what time symptoms started. Other signs of a stroke Some less common signs of a stroke include:  A sudden, severe headache with no known cause.  Nausea or vomiting.  Seizure. A stroke may be happening even if only one "BE FAST" symptoms is present. These symptoms may represent a serious problem that is an emergency. Do not wait to see if the symptoms will go away. Get medical help right away. Call your local emergency services (911 in the [place] Do not drive yourself to the hospital. Summary  A stroke is a medical emergency and should be treated right away-every second counts.  "BE FAST" is an easy way to remember the main warning signs of a stroke.  Call local emergency services right away if you or someone else has any stroke symptoms, even if the symptoms go away.  Make note of what time the first symptoms appeared. Emergency responders or emergency room staff will need to know this information.  Do not wait to see if symptoms will go away. Call 911 even if only one of the "BE FAST" symptoms appears. This information is not intended to replace advice given to you by your health care provider. Make sure you discuss any questions you have with your health care provider. Document Released: 06/22/2016 Document Revised: 02/15/2017 Document Reviewed: 06/22/2016 Elsevier Patient Education  2020 [employer]  
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
## 2019-03-06 ENCOUNTER — Ambulatory Visit: Payer: Medicaid Other | Admitting: Physical Therapy

## 2019-03-06 ENCOUNTER — Other Ambulatory Visit: Payer: Self-pay

## 2019-03-06 VITALS — BP 167/104 | HR 66

## 2019-03-06 NOTE — Patient Instructions (Signed)
Blood Pressure Record Sheet To take your blood pressure, you will need a blood pressure machine. You can buy a blood pressure machine (blood pressure monitor) at your clinic, drug store, or online. When choosing one, consider:  An automatic monitor that has an arm cuff.  A cuff that wraps snugly around your upper arm. You should be able to fit only one finger between your arm and the cuff.  A device that stores blood pressure reading results.  Do not choose a monitor that measures your blood pressure from your wrist or finger. Follow your health care provider's instructions for how to take your blood pressure. To use this form:  Get one reading in the morning (a.m.) before you take any medicines.  Get one reading in the evening (p.m.) before supper.  Take at least 2 readings with each blood pressure check. This makes sure the results are correct. Wait 1-2 minutes between measurements.  Write down the results in the spaces on this form.  Repeat this once a week, or as told by your health care provider.  Make a follow-up appointment with your health care provider to discuss the results. Blood pressure log Date: _______________________  a.m. _____________________(1st reading) _____________________(2nd reading)  p.m. _____________________(1st reading) _____________________(2nd reading) Date: _______________________  a.m. _____________________(1st reading) _____________________(2nd reading)  p.m. _____________________(1st reading) _____________________(2nd reading) Date: _______________________  a.m. _____________________(1st reading) _____________________(2nd reading)  p.m. _____________________(1st reading) _____________________(2nd reading) Date: _______________________  a.m. _____________________(1st reading) _____________________(2nd reading)  p.m. _____________________(1st reading) _____________________(2nd reading) Date: _______________________  a.m.  _____________________(1st reading) _____________________(2nd reading)  p.m. _____________________(1st reading) _____________________(2nd reading) This information is not intended to replace advice given to you by your health care provider. Make sure you discuss any questions you have with your health care provider. Document Released: 12/02/2002 Document Revised: 05/03/2017 Document Reviewed: 03/05/2017 Elsevier Patient Education  2020 Elsevier Inc.  

## 2019-03-06 NOTE — Therapy (Signed)
[facility] [address] [place], [place], [address] Phone: [phone]   Fax:  [phone]  Physical Therapy Treatment  Patient Details  Name: Adrian Alexander MRN: [id number] Date of Birth: 08-Aug-1960 Referring Provider (PT): [place], [place]   Encounter Date: 03/06/2019  PT End of Session - 03/06/19 1439    Visit Number  3   no charge due to high BP   Number of Visits  7   awaiting auth for 2x week for 2 weeks   Authorization Type  CCME approved 4 PT visits 03/05/19 - 03/18/19    Authorization - Visit Number  0    Authorization - Number of Visits  4    PT Start Time  2952    PT Stop Time  1425    PT Time Calculation (min)  26 min    Activity Tolerance  --   unable to participate in PT today due to high BP   Behavior During Therapy  [facility] for tasks assessed/performed       Past Medical History:  Diagnosis Date  . Anxiety   . Bipolar affective disorder, depressed ([place])   . Hypertension   . Schizo-affective schizophrenia ([place])   . Schizophrenia, schizo-affective ([place])   . Seizure ([place])   . Seizures ([place])    Last month. gets a sensation    Past Surgical History:  Procedure Laterality Date  . ANKLE SURGERY    . CLOSED REDUCTION WITH HUMER PIN INSERTION     left ankle 1998  . HERNIA REPAIR     while in prison  . HERNIA REPAIR      Vitals:   03/06/19 1405 03/06/19 1414  BP: (!) 155/102 (!) 167/104  Pulse: 68 66    Subjective Assessment - 03/06/19 1438    Subjective  Took his BP medication at noon today.    Pertinent History  bipolar disorder, HTN, schizophrenia, hx of seizures    How long can you walk comfortably?  5-10 minutes.    Diagnostic tests  MRI: 02/11/19 for lumbar spine (to rule out cord compression - from PCP note)    Patient Stated Goals  wants to improve his walking, loosen up his low back and his legs.    Currently in Pain?  No/denies                      Pt arrived  to therapy session - pt reporting that he took his BP medication at noon today. Took BP at 155/102, Pt reporting that he is asymptomatic - no signs/symptoms of a CVA. Pt stating that he has an appointment with his PCP next week regarding his HTN. Waited 10 minutes and took BP again - 167/104. Therapist called pt's PCP office - was given instructions for pt to go home and to monitor any signs/symptoms of a CVA. If he notices any then to go to the ER immediately. Reviewed signs/symptoms of a CVA using BE FAST acronym. Educated pt to purchase his own automatic BP unit that he could buy at a drug store to monitor his BP at home (printed out example for patient as well as BP log to monitor BP). Pt verbalized understanding of above instructions. Pt got a ride to and from therapy session.             PT Short Term Goals - 02/17/19 1109      PT SHORT TERM GOAL #1   Title  Patient will  be independent with initial HEP for balance and ROM. ALL STGS DUE AFTER 3RD VISIT.    Baseline  pt reports that he has been performing his exercises at home.    Status  Achieved      PT SHORT TERM GOAL #2   Title  Patient will decrease 5x sit <> stand time to 17 seconds or less without BUE support from lower arm chair to demo improved LE strength.    Baseline  19 seconds from standard height chair without UE support on 02/17/19 (previously 20.21 seconds)    Status  Not Met      PT SHORT TERM GOAL #3   Title  Patient will improve gait speed with no AD to at least 2.80 ft/sec to demonstrate improved community mobility.    Baseline  with SPC 15.66 seconds = 2.09 ft/sec (previously 2.46 ft/sec on 01/23/19)    Status  Not Met      PT SHORT TERM GOAL #4   Title  Patient will improve FGA score to at least a 16/30 to demo decreased fall risk.    Baseline  12/30 on 02/17/19 (previously 12/30 on 01/23/19)    Status  Not Met      PT SHORT TERM GOAL #5   Title  Patient will report worse back pain to 4/10 or less in order to  improve functional mobility.    Baseline  7/10 on 02/17/19    Status  Not Met        PT Long Term Goals - 02/18/19 0916      PT LONG TERM GOAL #1   Title  Patient will be independent with final HEP for balance and ROM. ALL LTGS DUE AFTER 7TH VISIT.    Baseline  ongoing with HEP    Status  On-going      PT LONG TERM GOAL #2   Title  Patient will ambulate at least 47' with supervision and no LOB with SPC while scanning environment in order to demo improved community mobility.    Baseline  not yet assessed.    Status  New      PT LONG TERM GOAL #3   Title  Patient will decrease 5x sit <> stand time to 17 seconds or less without BUE support from lower arm chair to demo improved LE strength.    Baseline  19 seconds from standard height chair without UE support on 02/17/19    Status  New      PT LONG TERM GOAL #4   Title  Patient will improve baseline BERG score by at least 4 points in order to demo decreased fall risk.    Baseline  not yet assessed.    Status  New      PT LONG TERM GOAL #5   Title  Patient will improve gait speed with no AD to at least 2.7 ft/sec with [facility] to demonstrate improved community mobility.    Baseline  2.09 ft/sec with SPC on 02/17/19    Status  New      PT LONG TERM GOAL #6   Title  Patient will perform at least 12 steps with mod I with step to pattern vs step through ascending/descending with BUE on handrails in order to safely perform a flight of stairs at his apartment.    Baseline  8 steps with BUE support and supervision/min guard with step to pattern.    Status  On-going  Plan - 03/06/19 1440    Clinical Impression Statement  Unable to participate in PT today due to high BP at rest. PT called and let pt's PCP office know - instructions given to patient (see note) and pt verbalized understanding. Pt has appointment with Dr. Maia Petties next week.    Personal Factors and Comorbidities  Past/Current Experience;Time since onset of  injury/illness/exacerbation    Examination-Activity Limitations  Locomotion Level;Stand;Stairs;Squat    Examination-Participation Restrictions  Community Activity   outdoor activities   Stability/Clinical Decision Making  Evolving/Moderate complexity    Rehab Potential  Fair    PT Frequency  2x / week    PT Duration  2 weeks    PT Treatment/Interventions  ADLs/Self Care Home Management;Aquatic Therapy;Therapeutic exercise;Therapeutic activities;Functional mobility training;Stair training;Gait training;DME Instruction;Balance training;Neuromuscular re-education;Patient/family education;Energy conservation    PT Next Visit Plan  check BP! how is HEP? try performing BERG. balance, gait training with SPC, LE strengthening    PT Home Exercise Plan  [id number]    Consulted and Agree with Plan of Care  Patient       Patient will benefit from skilled therapeutic intervention in order to improve the following deficits and impairments:  Abnormal gait, Decreased activity tolerance, Decreased balance, Decreased coordination, Difficulty walking, Decreased strength, Postural dysfunction, Pain, Decreased mobility  Visit Diagnosis: Other abnormalities of gait and mobility  History of falling  Unsteadiness on feet     Problem List Patient Active Problem List   Diagnosis Date Noted  . Chest pain 03/29/2017  . Gastritis 03/29/2017  . Hematochezia 03/29/2017  . Hematemesis 03/29/2017  . Alcohol dependence with withdrawal, uncomplicated ([place]) 30/11/2328  . Post traumatic stress disorder (PTSD) 01/25/2015  . Schizoaffective disorder ([place]) 01/25/2015  . Cocaine abuse with cocaine-induced mood disorder ([place]) 01/25/2015  . MDD (major depressive disorder) 01/24/2015  . Substance induced mood disorder ([place]) 09/10/2011    Arliss Journey, PT, DPT  03/06/2019, 2:40 PM  [facility] [address] [place], [place], [address] Phone:  [phone]   Fax:  [phone]  Name: Adrian Alexander MRN: [id number] Date of Birth: 1961/01/30

## 2019-03-11 ENCOUNTER — Inpatient Hospital Stay: Admission: RE | Admit: 2019-03-11 | Payer: Medicaid Other | Source: Ambulatory Visit

## 2019-03-17 ENCOUNTER — Ambulatory Visit: Payer: Medicaid Other | Admitting: Physical Therapy

## 2019-03-17 ENCOUNTER — Other Ambulatory Visit: Payer: Self-pay

## 2019-03-17 VITALS — BP 90/58 | HR 62

## 2019-03-17 NOTE — Patient Instructions (Signed)
Blood Pressure Record Sheet To take your blood pressure, you will need a blood pressure machine. You can buy a blood pressure machine (blood pressure monitor) at your clinic, drug store, or online. When choosing one, consider:  An automatic monitor that has an arm cuff.  A cuff that wraps snugly around your upper arm. You should be able to fit only one finger between your arm and the cuff.  A device that stores blood pressure reading results.  Do not choose a monitor that measures your blood pressure from your wrist or finger. Follow your health care provider's instructions for how to take your blood pressure. To use this form:  Get one reading in the morning (a.m.) before you take any medicines.  Get one reading in the evening (p.m.) before supper.  Take at least 2 readings with each blood pressure check. This makes sure the results are correct. Wait 1-2 minutes between measurements.  Write down the results in the spaces on this form.  Repeat this once a week, or as told by your health care provider.  Make a follow-up appointment with your health care provider to discuss the results. Blood pressure log Date: _______________________  a.m. _____________________(1st reading) _____________________(2nd reading)  p.m. _____________________(1st reading) _____________________(2nd reading) Date: _______________________  a.m. _____________________(1st reading) _____________________(2nd reading)  p.m. _____________________(1st reading) _____________________(2nd reading) Date: _______________________  a.m. _____________________(1st reading) _____________________(2nd reading)  p.m. _____________________(1st reading) _____________________(2nd reading) Date: _______________________  a.m. _____________________(1st reading) _____________________(2nd reading)  p.m. _____________________(1st reading) _____________________(2nd reading) Date: _______________________  a.m.  _____________________(1st reading) _____________________(2nd reading)  p.m. _____________________(1st reading) _____________________(2nd reading) This information is not intended to replace advice given to you by your health care provider. Make sure you discuss any questions you have with your health care provider. Document Released: 12/02/2002 Document Revised: 05/03/2017 Document Reviewed: 03/05/2017 Elsevier Patient Education  2020 Elsevier Inc.  

## 2019-03-17 NOTE — Therapy (Signed)
[facility] [address] [place], [place], [address] Phone: [phone]   Fax:  [phone]  Physical Therapy Treatment  Patient Details  Name: Adrian Alexander MRN: [id number] Date of Birth: 1960/09/29 Referring Provider (PT): Latanya Presser   Encounter Date: 03/17/2019  PT End of Session - 03/17/19 1305    Visit Number  3   no charge since BP too low in standing - pt reporting increased light headedness/dizziness   Number of Visits  9   Authorization Type  CCME approved 4 PT visits 03/05/19 - 03/18/19    Authorization - Visit Number  0    Authorization - Number of Visits  4    PT Start Time  1230    PT Stop Time  1310    PT Time Calculation (min)  40 min    Activity Tolerance  --   unable to participate due to low BP in standing   Behavior During Therapy  [facility] for tasks assessed/performed       Past Medical History:  Diagnosis Date  . Anxiety   . Bipolar affective disorder, depressed ([place])   . Hypertension   . Schizo-affective schizophrenia ([place])   . Schizophrenia, schizo-affective ([place])   . Seizure ([place])   . Seizures ([place])    Last month. gets a sensation    Past Surgical History:  Procedure Laterality Date  . ANKLE SURGERY    . CLOSED REDUCTION WITH HUMER PIN INSERTION     left ankle 1998  . HERNIA REPAIR     while in prison  . HERNIA REPAIR      Vitals:   03/17/19 1244 03/17/19 1248 03/17/19 1302 03/17/19 1309  BP: 108/65 (!) 83/54 (!) 93/55 (!) 90/58  Pulse: (!) 59 81 62 62    Subjective Assessment - 03/17/19 1232    Subjective  Went to Dr. Caroline More office last week. Now taking clonidine 3x per day. Bought a blood pressure cuff and states that his blood pressure has been better - "does not remember". Says that his MRI has been re-scheduled and does not know when. Reporting feeling sleepy and light headed since taking it.    Pertinent History  bipolar disorder, HTN, schizophrenia, hx of  seizures    How long can you walk comfortably?  5-10 minutes.    Diagnostic tests  MRI: 02/11/19 for lumbar spine (to rule out cord compression - from PCP note)    Patient Stated Goals  wants to improve his walking, loosen up his low back and his legs.    Currently in Pain?  Yes    Pain Score  6     Pain Location  Back    Pain Type  Chronic pain                   03/19/19 0001  Transfers  Five time sit to stand comments  22.97 seconds from standard height chair without UE suppor                PT Education - 03/17/19 1304    Education Details  provided patient with BP log to monitor BP at home.    Person(s) Educated  Patient    Methods  Explanation;Handout    Comprehension  Verbalized understanding       PT Short Term Goals - 03/17/19 1238      PT SHORT TERM GOAL #1   Title  Patient will be independent with initial HEP for balance  and ROM. ALL STGS DUE AFTER 3RD VISIT.    Baseline  pt reports that he has been performing his exercises at home.    Status  Achieved      PT SHORT TERM GOAL #2   Title  Patient will decrease 5x sit <> stand time to 17 seconds or less without BUE support from lower arm chair to demo improved LE strength.    Baseline  22.97 seconds from standard height chair without UE support on 03/17/19 (previously 19 seconds)    Status  Not Met      PT SHORT TERM GOAL #3   Title  Patient will improve gait speed with no AD to at least 2.80 ft/sec to demonstrate improved community mobility.    Baseline  with SPC 15.66 seconds = 2.09 ft/sec (previously 2.46 ft/sec on 01/23/19)    Status  Not Met      PT SHORT TERM GOAL #4   Title  Patient will improve FGA score to at least a 16/30 to demo decreased fall risk.    Baseline  12/30 on 02/17/19 (previously 12/30 on 01/23/19)    Status  Not Met      PT SHORT TERM GOAL #5   Title  Patient will report worse back pain to 4/10 or less in order to improve functional mobility.    Baseline  6/10 on  03/17/19    Status  Not Met      Revised/On-going STGs:  PT Short Term Goals - 03/17/19 1238      PT SHORT TERM GOAL #1   Title  Patient will be independent with initial HEP for balance and ROM. ALL STGS DUE AFTER 6TH VISIT.    Baseline  pt reports that he has been performing his exercises at home - will continue to benefit from additions    Status  On-going      PT SHORT TERM GOAL #2   Title  Patient will decrease 5x sit <> stand time to 19 seconds or less without BUE support from lower arm chair to demo improved LE strength.    Baseline  22.97 seconds from standard height chair without UE support on 03/17/19 (previously 19 seconds)    Status  On-going      PT SHORT TERM GOAL #3   Title  Patient will improve gait speed with SPC to at least 2.6 ft/sec to demonstrate improved community mobility.    Baseline  with SPC 15.66 seconds = 2.09 ft/sec (previously 2.46 ft/sec on 01/23/19)    Status  Revised      PT SHORT TERM GOAL #4   Title  Patient will improve FGA score to at least a 15/30 to demo decreased fall risk.    Baseline  12/30 on 02/17/19 (previously 12/30 on 01/23/19)    Status  Revised      PT SHORT TERM GOAL #5   Title  Patient will report worse back pain to 4/10 or less in order to improve functional mobility.    Baseline  6/10 on 03/17/19    Status  On-going        PT Long Term Goals - 02/18/19 0916      PT LONG TERM GOAL #1   Title  Patient will be independent with final HEP for balance and ROM. ALL LTGS DUE AFTER 7TH VISIT.    Baseline  ongoing with HEP    Status  On-going      PT LONG TERM GOAL #2  Title  Patient will ambulate at least 30' with supervision and no LOB with SPC while scanning environment in order to demo improved community mobility.    Baseline  not yet assessed.    Status  New      PT LONG TERM GOAL #3   Title  Patient will decrease 5x sit <> stand time to 17 seconds or less without BUE support from lower arm chair to demo improved LE  strength.    Baseline  19 seconds from standard height chair without UE support on 02/17/19    Status  New      PT LONG TERM GOAL #4   Title  Patient will improve baseline BERG score by at least 4 points in order to demo decreased fall risk.    Baseline  not yet assessed.    Status  New      PT LONG TERM GOAL #5   Title  Patient will improve gait speed with no AD to at least 2.7 ft/sec with [facility] to demonstrate improved community mobility.    Baseline  2.09 ft/sec with SPC on 02/17/19    Status  New      PT LONG TERM GOAL #6   Title  Patient will perform at least 12 steps with mod I with step to pattern vs step through ascending/descending with BUE on handrails in order to safely perform a flight of stairs at his apartment.    Baseline  8 steps with BUE support and supervision/min guard with step to pattern.    Status  On-going      On-going LTGs:  PT Long Term Goals - 03/19/19 0815      PT LONG TERM GOAL #1   Title  Patient will be independent with final HEP for balance and ROM. ALL LTGS DUE AFTER 9TH VISIT.    Baseline  ongoing with HEP    Status  On-going      PT LONG TERM GOAL #2   Title  Patient will ambulate at least 53' with supervision and no LOB with SPC while scanning environment in order to demo improved community mobility.    Baseline  not yet assessed.    Status  On-going      PT LONG TERM GOAL #3   Title  Patient will decrease 5x sit <> stand time to 17 seconds or less without BUE support from lower arm chair to demo improved LE strength.    Baseline  22.97 seconds from standard height chair without UE support on 03/17/19    Status  On-going      PT LONG TERM GOAL #4   Title  Patient will improve baseline BERG score by at least 4 points in order to demo decreased fall risk.    Baseline  not yet assessed - unable to assess due to BP in therapy sessions    Status  New      PT LONG TERM GOAL #5   Title  Patient will improve gait speed with SPC to at least 3.0  ft/sec with [facility] to demonstrate improved community mobility.    Baseline  2.09 ft/sec with SPC on 02/17/19    Status  Revised      PT LONG TERM GOAL #6   Title  Patient will perform at least 12 steps with mod I with step to pattern vs step through ascending/descending with BUE on handrails in order to safely perform a flight of stairs at his apartment.    Baseline  8 steps with BUE support and supervision/min guard with step to pattern.    Status  On-going            03/19/19 0809  Plan  Clinical Impression Statement Pt reports having his BP medication changed last week. After performing sit <> stand at edge of mat table, pt reporting feeling lightheaded/dizzy. Took BP (see vitals) with drop in both diastolic and systolic. Pt reporting that since his medications have been changed he has been feeling more tired/lightheaded with standing. Pt has not yet told his PCP. Called pt's PCP (Dr. Maia Petties) and made his office aware of pt's lower BP after medication changes. Stated they would reach out to pt regarding BP medication changes. Pt's progress and participation has been limited in PT due to getting BP under control to be in functional limits for therapy. Unable to check remainder of STGs for Medicaid re-auth. Pt did not meet 5x sit <> stand goal today. Will ask for re-auth for 1x week for 3 weeks - starting mid January to allow pt time to adjust to BP medications to participate in therapy. STGs and LTGs are all on-going.  Personal Factors and Comorbidities Past/Current Experience;Time since onset of injury/illness/exacerbation  Examination-Activity Limitations Locomotion Level;Stand;Stairs;Squat  Examination-Participation Restrictions Community Activity (outdoor activities)  Pt will benefit from skilled therapeutic intervention in order to improve on the following deficits Abnormal gait;Decreased activity tolerance;Decreased balance;Decreased coordination;Difficulty walking;Decreased  strength;Postural dysfunction;Pain;Decreased mobility  Stability/Clinical Decision Making Evolving/Moderate complexity  Rehab Potential Fair  PT Frequency 1x / week (followed by 1x week for 3 weeks)  PT Duration 3 weeks  PT Treatment/Interventions ADLs/Self Care Home Management;Aquatic Therapy;Therapeutic exercise;Therapeutic activities;Functional mobility training;Stair training;Gait training;DME Instruction;Balance training;Neuromuscular re-education;Patient/family education;Energy conservation  PT Next Visit Plan check BP! how is HEP? try performing BERG. balance, gait training with SPC, LE strengthening  PT Home Exercise Plan [id number]  Consulted and Agree with Plan of Care Patient       Patient will benefit from skilled therapeutic intervention in order to improve the following deficits and impairments:     Visit Diagnosis: History of falling  Other abnormalities of gait and mobility  Unsteadiness on feet     Problem List Patient Active Problem List   Diagnosis Date Noted  . Chest pain 03/29/2017  . Gastritis 03/29/2017  . Hematochezia 03/29/2017  . Hematemesis 03/29/2017  . Alcohol dependence with withdrawal, uncomplicated ([place]) 93/26/7124  . Post traumatic stress disorder (PTSD) 01/25/2015  . Schizoaffective disorder ([place]) 01/25/2015  . Cocaine abuse with cocaine-induced mood disorder ([place]) 01/25/2015  . MDD (major depressive disorder) 01/24/2015  . Substance induced mood disorder ([place]) 09/10/2011    Arliss Journey, PT, DPT  03/17/2019, 2:32 PM  [facility] [address] [place], [place], [address] Phone: [phone]   Fax:  [phone]  Name: Adrian Alexander MRN: [id number] Date of Birth: Mar 02, 1961

## 2019-03-18 ENCOUNTER — Ambulatory Visit: Payer: Medicaid Other | Admitting: Physical Therapy

## 2019-03-23 ENCOUNTER — Ambulatory Visit: Payer: Medicaid Other | Admitting: Physical Therapy

## 2019-03-26 ENCOUNTER — Ambulatory Visit: Payer: Medicaid Other | Admitting: Physical Therapy

## 2019-03-30 ENCOUNTER — Encounter: Payer: Medicaid Other | Admitting: Internal Medicine

## 2019-03-30 ENCOUNTER — Telehealth: Payer: Self-pay | Admitting: Pharmacy Technician

## 2019-03-30 NOTE — Telephone Encounter (Signed)
[facility] Patient Advocate Encounter    Findings of the benefits investigation conducted this morning via test claims for the patient's upcoming appointment on 03/30/2019 are as follows:   Insurance: [employer]- active  Estimated copay amount: $3.00 Prior Authorization: will begin insurance process once medication is prescribed  [facility] Patient Advocate will follow up once patient arrives for their appointment. Will need a readiness form filled out.   Beulah Gandy, CPhT Specialty Pharmacy Patient [facility] Phone: [phone] Fax: [phone] 03/30/2019 9:52 AM

## 2019-04-06 ENCOUNTER — Encounter: Payer: Medicaid Other | Admitting: Infectious Diseases

## 2019-04-07 ENCOUNTER — Ambulatory Visit: Payer: Medicaid Other | Admitting: Physical Therapy

## 2019-04-09 ENCOUNTER — Emergency Department (HOSPITAL_COMMUNITY)
Admission: EM | Admit: 2019-04-09 | Discharge: 2019-04-09 | Disposition: A | Discharge status: Home / Self Care | Payer: Medicaid Other | Attending: Emergency Medicine | Admitting: Emergency Medicine

## 2019-04-09 ENCOUNTER — Encounter (HOSPITAL_COMMUNITY): Payer: Self-pay | Admitting: Emergency Medicine

## 2019-04-09 ENCOUNTER — Other Ambulatory Visit: Payer: Self-pay

## 2019-04-09 DIAGNOSIS — G8929 Other chronic pain: Secondary | ICD-10-CM | POA: Insufficient documentation

## 2019-04-09 DIAGNOSIS — F250 Schizoaffective disorder, bipolar type: Secondary | ICD-10-CM | POA: Diagnosis not present

## 2019-04-09 DIAGNOSIS — M545 Low back pain, unspecified: Secondary | ICD-10-CM

## 2019-04-09 DIAGNOSIS — Z87891 Personal history of nicotine dependence: Secondary | ICD-10-CM | POA: Insufficient documentation

## 2019-04-09 DIAGNOSIS — Z79899 Other long term (current) drug therapy: Secondary | ICD-10-CM | POA: Insufficient documentation

## 2019-04-09 MED ORDER — KETOROLAC TROMETHAMINE 30 MG/ML IJ SOLN
30.0000 mg | Freq: Once | INTRAMUSCULAR | Status: AC
  Administered 2019-04-09: 30 mg via INTRAVENOUS
  Filled 2019-04-09: qty 1

## 2019-04-09 MED ORDER — HYDROCODONE-ACETAMINOPHEN 5-325 MG PO TABS
1.0000 | ORAL_TABLET | Freq: Once | ORAL | Status: AC
  Administered 2019-04-09: 1 via ORAL
  Filled 2019-04-09: qty 1

## 2019-04-09 MED ORDER — DICLOFENAC SODIUM 1 % EX GEL: 2.0000 g | Freq: Four times a day (QID) | CUTANEOUS | 0 refills | Status: AC

## 2019-04-09 NOTE — Discharge Instructions (Addendum)
Please use Tylenol or ibuprofen for pain.  You may use 600 mg ibuprofen every 6 hours or 1000 mg of Tylenol every 6 hours.  You may choose to alternate between the 2.  This would be most effective.  Not to exceed 4 g of Tylenol within 24 hours.  Not to exceed 3200 mg ibuprofen 24 hours.  

## 2019-04-09 NOTE — ED Provider Notes (Signed)
[facility] EMERGENCY DEPARTMENT Provider Note   CSN: [id number] Arrival date & time: 04/09/19  1743     History Chief Complaint  Patient presents with  . Back Pain    Adrian Alexander is a 59 y.o. male.  HPI  Patient presents today with lower back pain that he has been evaluated for numerous times in the past states that he has had CT scans and told that he has a protruding/bulging disc.  He states that the injury occurred several years ago when he was lifting weights had sudden onset of back pain.  He states it does not radiate to his legs and is located in his lower back exactly where it has been over the past several years.  He states he has been prescribed tramadol for this in the past but it does not have any currently.  Patient states that he is interested in having surgery done on his back but has not been evaluated for this in the past.  Patient denies any abdominal pain, chest pain, shortness of breath, nausea, denies any bowel or bladder incontinence or difficulty using the restroom.  Denies any numbness in his legs although he does occasionally have numbness in his toe tips.  States that he does have some pain that radiates down his right leg occasionally this has been is constant the last several years.  No history of IV drug use, no blood thinners.  No history of cancer.  No history of blood clots.  No urinary symptoms frequency, urgency or burning of urination.    Past Medical History:  Diagnosis Date  . Anxiety   . Bipolar affective disorder, depressed ([place])   . Hypertension   . Schizo-affective schizophrenia ([place])   . Schizophrenia, schizo-affective ([place])   . Seizure ([place])   . Seizures ([place])    Last month. gets a sensation    Patient Active Problem List   Diagnosis Date Noted  . Chest pain 03/29/2017  . Gastritis 03/29/2017  . Hematochezia 03/29/2017  . Hematemesis 03/29/2017  . Alcohol dependence with withdrawal, uncomplicated ([place])  34/19/6222  . Post traumatic stress disorder (PTSD) 01/25/2015  . Schizoaffective disorder ([place]) 01/25/2015  . Cocaine abuse with cocaine-induced mood disorder ([place]) 01/25/2015  . MDD (major depressive disorder) 01/24/2015  . Substance induced mood disorder ([place]) 09/10/2011    Past Surgical History:  Procedure Laterality Date  . ANKLE SURGERY    . CLOSED REDUCTION WITH HUMER PIN INSERTION     left ankle 1998  . HERNIA REPAIR     while in prison  . HERNIA REPAIR         Family History  Problem Relation Age of Onset  . Cancer Mother 72       colon  . Heart disease Father   . Heart attack Father 37    Social History   Tobacco Use  . Smoking status: Former Smoker    Packs/day: 0.10    Years: 20.00    Pack years: 2.00    Types: Cigarettes  . Smokeless tobacco: Current User    Types: Snuff  Substance Use Topics  . Alcohol use: Yes    Alcohol/week: 42.0 standard drinks    Types: 42 Cans of beer per week  . Drug use: Yes    Types: Marijuana, Cocaine    Home Medications Prior to Admission medications   Medication Sig Start Date End Date Taking? Authorizing Provider  cholecalciferol (VITAMIN D) 1000 UNITS tablet Take 2 tablets (2,000  Units total) by mouth daily. Patient not taking: Reported on 03/29/2017 01/27/15   [name], NP  cloNIDine (CATAPRES) 0.3 MG tablet Take 1 tablet (0.3 mg total) by mouth 2 (two) times daily. Patient taking differently: Take 0.3 mg by mouth 3 (three) times daily.  02/07/15   Henrietta Hoover, NP  cyclobenzaprine (FLEXERIL) 5 MG tablet Take 5 mg by mouth 3 (three) times daily. 03/14/17   [provider]  diclofenac Sodium (VOLTAREN) 1 % GEL Apply 2 g topically 4 (four) times daily. 04/09/19   Gailen Shelter, PA  gabapentin (NEURONTIN) 400 MG capsule TAKE ONE CAPSULE BY MOUTH THREE TIMES DAILY 05/30/15   Henrietta Hoover, NP  hydrochlorothiazide (HYDRODIURIL) 25 MG tablet Take 25 mg by mouth daily. 03/14/17   [provider]  lisinopril (PRINIVIL,ZESTRIL) 40 MG tablet Take 40 mg by mouth daily. 03/14/17   [provider]  Multiple Vitamin (MULTIVITAMIN WITH MINERALS) TABS tablet Take 1 tablet by mouth daily. Patient not taking: Reported on 03/29/2017 01/27/15   [name], NP  pantoprazole (PROTONIX) 40 MG tablet Take 2 tablets (80 mg total) by mouth 2 (two) times daily. 03/30/17   Levora Dredge, MD  phenytoin (DILANTIN) 100 MG ER capsule Take 100 mg by mouth daily. 03/14/17   [provider]  QUEtiapine (SEROQUEL) 400 MG tablet Take 400 mg by mouth at bedtime. 02/21/17   [provider]    Allergies    Patient has no known allergies.  Review of Systems   Review of Systems  Constitutional: Negative for chills and fever.  HENT: Negative for congestion.   Eyes: Negative for pain.  Respiratory: Negative for cough and shortness of breath.   Cardiovascular: Negative for chest pain and leg swelling.  Gastrointestinal: Negative for abdominal pain and vomiting.  Genitourinary: Negative for dysuria.  Musculoskeletal: Positive for back pain and gait problem (chronic). Negative for myalgias.  Skin: Negative for rash.  Neurological: Negative for dizziness and headaches.    Physical Exam Updated Vital Signs BP (!) 173/104 (BP Location: Right Arm)   Pulse 84   Temp 98.1 F (36.7 C) (Oral)   Resp 18   Ht 6\' 1"  (1.854 m)   Wt 94.3 kg   SpO2 99%   BMI 27.44 kg/m   Physical Exam Vitals and nursing note reviewed.  Constitutional:      General: He is not in acute distress.    Appearance: Normal appearance. He is not ill-appearing.  HENT:     Head: Normocephalic and atraumatic.  Eyes:     General: No scleral icterus.       Right eye: No discharge.        Left eye: No discharge.     Conjunctiva/sclera: Conjunctivae normal.  Cardiovascular:     Pulses: Normal pulses.     Comments: Lateral DP PT pulses normal Pulmonary:     Effort: Pulmonary effort is normal.       Breath sounds: No stridor.  Abdominal:     Tenderness: There is no abdominal tenderness. There is no right CVA tenderness, left CVA tenderness, guarding or rebound.     Hernia: No hernia is present.  Musculoskeletal:     Comments: No tenderness to palpation of lower extremities.  Mild lower lumbar tenderness to palpation that is midline.  Identical to his past lumbar back pain.  Neurological:     Mental Status: He is alert and oriented to person, place, and time. Mental status is  at baseline.     Comments: Sensation grossly intact bilateral lower extremities.  Patient has slow gait but is able to ambulate with his cane     ED Results / Procedures / Treatments   Labs (all labs ordered are listed, but only abnormal results are displayed) Labs Reviewed - No data to display  EKG None  Radiology No results found.  Procedures Procedures (including critical care time)  Medications Ordered in ED Medications  ketorolac (TORADOL) 30 MG/ML injection 30 mg (30 mg Intravenous Given 04/09/19 1858)  HYDROcodone-acetaminophen (NORCO/VICODIN) 5-325 MG per tablet 1 tablet (1 tablet Oral Given 04/09/19 1858)    ED Course  I have reviewed the triage vital signs and the nursing notes.  Pertinent labs & imaging results that were available during my care of the patient were reviewed by me and considered in my medical decision making (see chart for details).    MDM Rules/Calculators/A&P                      Patient is 59 year old male with a history of slipped disc in lumbar region.  Had spinal lumbar x-ray done approximately 1 month ago which showed decreased space between vertebra lumbar spine consistent with slipped disc.  Patient states he has had CT scans in the past for this.  He is not have any red flags for back pain.  This is been constant and is unchanged from his usual.  He has some subjective weakness but no true weakness and lower extremities.  Good sensation throughout.  Patient  given IM Toradol, 1 Norco, prescribed Voltaren and Tylenol and given stretches and recommendations [facility].   The medical records were personally reviewed by myself. I personally reviewed all lab results and interpreted all imaging studies and either concurred with their official read or contacted radiology for clarification.   This patient appears reasonably screened and I doubt any other medical condition requiring further workup, evaluation, or treatment in the ED at this time prior to discharge.   Patient's vitals are WNL apart from vital sign abnormalities discussed above, patient is in NAD, and able to ambulate in the ED at their baseline and able to tolerate PO.  Pain has been managed or a plan has been made for home management and has no complaints prior to discharge. Patient is comfortable with above plan and for discharge at this time. All questions were answered prior to disposition. Results from the ER workup discussed with the patient face to face and all questions answered to the best of my ability. The patient is safe for discharge with strict return precautions. Patient appears safe for discharge with appropriate follow-up. Conveyed my impression with the patient and they voiced understanding and are agreeable to plan.   An After Visit Summary was printed and given to the patient.  Portions of this note were generated with Scientist, clinical (histocompatibility and immunogenetics). Dictation errors may occur despite best attempts at proofreading.    Final Clinical Impression(s) / ED Diagnoses Final diagnoses:  Chronic midline low back pain without sciatica    Rx / DC Orders ED Discharge Orders         Ordered    diclofenac Sodium (VOLTAREN) 1 % GEL  4 times daily     04/09/19 1901           Gailen Shelter, [place] 04/09/19 1907    Charlynne Pander, MD 04/09/19 2229

## 2019-04-09 NOTE — ED Triage Notes (Signed)
Pt reports being diagnosed with a pinched nerve a few years ago. States the pain goes down right leg that makes it hard to move.

## 2019-04-13 ENCOUNTER — Encounter (HOSPITAL_COMMUNITY): Payer: Self-pay | Admitting: Emergency Medicine

## 2019-04-13 ENCOUNTER — Emergency Department (HOSPITAL_COMMUNITY): Payer: Medicaid Other

## 2019-04-13 ENCOUNTER — Emergency Department (HOSPITAL_COMMUNITY)
Admission: EM | Admit: 2019-04-13 | Discharge: 2019-04-13 | Disposition: A | Discharge status: Home / Self Care | Payer: Medicaid Other | Attending: Emergency Medicine | Admitting: Emergency Medicine

## 2019-04-13 ENCOUNTER — Other Ambulatory Visit: Payer: Self-pay

## 2019-04-13 DIAGNOSIS — Z79899 Other long term (current) drug therapy: Secondary | ICD-10-CM | POA: Insufficient documentation

## 2019-04-13 DIAGNOSIS — M5441 Lumbago with sciatica, right side: Secondary | ICD-10-CM | POA: Diagnosis not present

## 2019-04-13 DIAGNOSIS — M5136 Other intervertebral disc degeneration, lumbar region: Secondary | ICD-10-CM | POA: Diagnosis not present

## 2019-04-13 DIAGNOSIS — M5442 Lumbago with sciatica, left side: Secondary | ICD-10-CM | POA: Insufficient documentation

## 2019-04-13 DIAGNOSIS — G8929 Other chronic pain: Secondary | ICD-10-CM | POA: Insufficient documentation

## 2019-04-13 DIAGNOSIS — I10 Essential (primary) hypertension: Secondary | ICD-10-CM | POA: Insufficient documentation

## 2019-04-13 DIAGNOSIS — M545 Low back pain: Secondary | ICD-10-CM | POA: Diagnosis present

## 2019-04-13 DIAGNOSIS — Z87891 Personal history of nicotine dependence: Secondary | ICD-10-CM | POA: Diagnosis not present

## 2019-04-13 LAB — CBC WITH DIFFERENTIAL/PLATELET
Abs Immature Granulocytes: 0.01 10*3/uL (ref 0.00–0.07)
Basophils Absolute: 0 10*3/uL (ref 0.0–0.1)
Basophils Relative: 1 %
Eosinophils Absolute: 0.1 10*3/uL (ref 0.0–0.5)
Eosinophils Relative: 3 %
HCT: 43 % (ref 39.0–52.0)
Hemoglobin: 13.8 g/dL (ref 13.0–17.0)
Immature Granulocytes: 0 %
Lymphocytes Relative: 31 %
Lymphs Abs: 1.2 10*3/uL (ref 0.7–4.0)
MCH: 25.9 pg — ABNORMAL LOW (ref 26.0–34.0)
MCHC: 32.1 g/dL (ref 30.0–36.0)
MCV: 80.7 fL (ref 80.0–100.0)
Monocytes Absolute: 0.5 10*3/uL (ref 0.1–1.0)
Monocytes Relative: 12 %
Neutro Abs: 2.1 10*3/uL (ref 1.7–7.7)
Neutrophils Relative %: 53 %
Platelets: 233 10*3/uL (ref 150–400)
RBC: 5.33 MIL/uL (ref 4.22–5.81)
RDW: 13.7 % (ref 11.5–15.5)
WBC: 4 10*3/uL (ref 4.0–10.5)
nRBC: 0 % (ref 0.0–0.2)

## 2019-04-13 LAB — BASIC METABOLIC PANEL
Anion gap: 10 (ref 5–15)
BUN: 10 mg/dL (ref 6–20)
CO2: 25 mmol/L (ref 22–32)
Calcium: 8.5 mg/dL — ABNORMAL LOW (ref 8.9–10.3)
Chloride: 106 mmol/L (ref 98–111)
Creatinine, Ser: 0.96 mg/dL (ref 0.61–1.24)
GFR calc Af Amer: >60 mL/min (ref >60)
GFR calc non Af Amer: >60 mL/min (ref >60)
Glucose, Bld: 94 mg/dL (ref 70–99)
Potassium: 4 mmol/L (ref 3.5–5.1)
Sodium: 141 mmol/L (ref 135–145)

## 2019-04-13 MED ORDER — TRAMADOL HCL 50 MG PO TABS: 50.0000 mg | ORAL_TABLET | Freq: Four times a day (QID) | ORAL | 0 refills | Status: AC | PRN

## 2019-04-13 MED ORDER — HYDROMORPHONE HCL 1 MG/ML IJ SOLN
2.0000 mg | Freq: Once | INTRAMUSCULAR | Status: AC
  Administered 2019-04-13: 2 mg via INTRAMUSCULAR
  Filled 2019-04-13: qty 2

## 2019-04-13 NOTE — ED Triage Notes (Signed)
Pt. Stated, My pain is affecting my legs and being able to walk

## 2019-04-13 NOTE — ED Provider Notes (Signed)
[facility] EMERGENCY DEPARTMENT Provider Note   CSN: [id number] Arrival date & time: 04/13/19  1720     History Chief Complaint  Patient presents with  . Back Pain    Adrian Alexander is a 59 y.o. male.  The history is provided by the patient.  Back Pain Location:  Lumbar spine Quality:  Cramping, shooting and stiffness Stiffness is present:  All day Radiates to:  R posterior upper leg and L posterior upper leg Pain severity:  Severe Pain is:  Same all the time Onset quality:  Gradual Duration:  1 week Timing:  Constant Progression:  Worsening Chronicity:  Recurrent Context: not jumping from heights, not lifting heavy objects, not physical stress, not recent illness and not recent injury   Context comment:  Recently due to the back pain and the new weakness in his legs he has had several falls Relieved by:  Nothing Worsened by:  Ambulation, bending and movement Ineffective treatments:  Muscle relaxants Associated symptoms: leg pain and weakness   Associated symptoms: no abdominal pain, no bladder incontinence, no bowel incontinence, no fever, no numbness, no paresthesias, no pelvic pain and no tingling   Associated symptoms comment:  Patient states for the last 3 days his legs have started feeling weak to where now his legs are dragging when he tries to walk so he is using a cane. Risk factors comment:  History of intermittent back pain for years and sometimes they will get worse but this is by far the worst.  No prior surgery to the back.  No IV drug use      Past Medical History:  Diagnosis Date  . Anxiety   . Bipolar affective disorder, depressed (HCC)   . Hypertension   . Schizo-affective schizophrenia (HCC)   . Schizophrenia, schizo-affective (HCC)   . Seizure (HCC)   . Seizures (HCC)    Last month. gets a sensation    Patient Active Problem List   Diagnosis Date Noted  . Chest pain 03/29/2017  . Gastritis 03/29/2017  . Hematochezia  03/29/2017  . Hematemesis 03/29/2017  . Alcohol dependence with withdrawal, uncomplicated (HCC) 01/25/2015  . Post traumatic stress disorder (PTSD) 01/25/2015  . Schizoaffective disorder (HCC) 01/25/2015  . Cocaine abuse with cocaine-induced mood disorder (HCC) 01/25/2015  . MDD (major depressive disorder) 01/24/2015  . Substance induced mood disorder (HCC) 09/10/2011    Past Surgical History:  Procedure Laterality Date  . ANKLE SURGERY    . CLOSED REDUCTION WITH HUMER PIN INSERTION     left ankle 1998  . HERNIA REPAIR     while in prison  . HERNIA REPAIR         Family History  Problem Relation Age of Onset  . Cancer Mother 58       colon  . Heart disease Father   . Heart attack Father 27    Social History   Tobacco Use  . Smoking status: Former Smoker    Packs/day: 0.10    Years: 20.00    Pack years: 2.00    Types: Cigarettes  . Smokeless tobacco: Current User    Types: Snuff  Substance Use Topics  . Alcohol use: Yes    Alcohol/week: 42.0 standard drinks    Types: 42 Cans of beer per week  . Drug use: Yes    Types: Marijuana, Cocaine    Home Medications Prior to Admission medications   Medication Sig Start Date End Date Taking? Authorizing Provider  cholecalciferol (VITAMIN  D) 1000 UNITS tablet Take 2 tablets (2,000 Units total) by mouth daily. Patient not taking: Reported on 03/29/2017 01/27/15   Derrill Center, NP  cloNIDine (CATAPRES) 0.3 MG tablet Take 1 tablet (0.3 mg total) by mouth 2 (two) times daily. Patient taking differently: Take 0.3 mg by mouth 3 (three) times daily.  02/07/15   Micheline Chapman, NP  cyclobenzaprine (FLEXERIL) 5 MG tablet Take 5 mg by mouth 3 (three) times daily. 03/14/17   [provider]  diclofenac Sodium (VOLTAREN) 1 % GEL Apply 2 g topically 4 (four) times daily. 04/09/19   Tedd Sias, PA  gabapentin (NEURONTIN) 400 MG capsule TAKE ONE CAPSULE BY MOUTH THREE TIMES DAILY 05/30/15   Micheline Chapman, NP    hydrochlorothiazide (HYDRODIURIL) 25 MG tablet Take 25 mg by mouth daily. 03/14/17   [provider]  lisinopril (PRINIVIL,ZESTRIL) 40 MG tablet Take 40 mg by mouth daily. 03/14/17   [provider]  Multiple Vitamin (MULTIVITAMIN WITH MINERALS) TABS tablet Take 1 tablet by mouth daily. Patient not taking: Reported on 03/29/2017 01/27/15   Derrill Center, NP  pantoprazole (PROTONIX) 40 MG tablet Take 2 tablets (80 mg total) by mouth 2 (two) times daily. 03/30/17   Ina Homes, MD  phenytoin (DILANTIN) 100 MG ER capsule Take 100 mg by mouth daily. 03/14/17   [provider]  QUEtiapine (SEROQUEL) 400 MG tablet Take 400 mg by mouth at bedtime. 02/21/17   [provider]    Allergies    Patient has no known allergies.  Review of Systems   Review of Systems  Constitutional: Negative for fever.  Gastrointestinal: Negative for abdominal pain and bowel incontinence.  Genitourinary: Negative for bladder incontinence and pelvic pain.  Musculoskeletal: Positive for back pain.  Neurological: Positive for weakness. Negative for tingling, numbness and paresthesias.  All other systems reviewed and are negative.   Physical Exam Updated Vital Signs BP (!) 158/100 (BP Location: Left Arm)   Pulse 74   Temp 97.8 F (36.6 C)   Resp 17   SpO2 99%   Physical Exam Vitals and nursing note reviewed.  Constitutional:      General: He is not in acute distress.    Appearance: Normal appearance. He is well-developed and normal weight.  HENT:     Head: Normocephalic and atraumatic.  Eyes:     Conjunctiva/sclera: Conjunctivae normal.     Pupils: Pupils are equal, round, and reactive to light.  Cardiovascular:     Rate and Rhythm: Normal rate and regular rhythm.     Heart sounds: No murmur.  Pulmonary:     Effort: Pulmonary effort is normal. No respiratory distress.     Breath sounds: Normal breath sounds. No wheezing or rales.  Abdominal:     General: There is  no distension.     Palpations: Abdomen is soft.     Tenderness: There is no abdominal tenderness. There is no guarding or rebound.  Musculoskeletal:        General: Normal range of motion.     Cervical back: Normal range of motion and neck supple.     Lumbar back: Tenderness present.       Back:     Right lower leg: No edema.     Left lower leg: No edema.  Skin:    General: Skin is warm and dry.     Findings: No erythema or rash.  Neurological:     Mental Status: He is alert  and oriented to person, place, and time.     Sensory: No sensory deficit.     Motor: Weakness present.     Comments: Difficulty lifting both legs off the bed but able to do it with great effort 4 out of 5 strength in hip flexors bilaterally.  5 out of 5 strength in dorsi and plantar flexion.  Sensation intact throughout.  Psychiatric:        Mood and Affect: Mood normal.        Behavior: Behavior normal.        Thought Content: Thought content normal.     ED Results / Procedures / Treatments   Labs (all labs ordered are listed, but only abnormal results are displayed) Labs Reviewed  CBC WITH DIFFERENTIAL/PLATELET - Abnormal; Notable for the following components:      Result Value   MCH 25.9 (*)    All other components within normal limits  BASIC METABOLIC PANEL - Abnormal; Notable for the following components:   Calcium 8.5 (*)    All other components within normal limits    EKG None  Radiology MR LUMBAR SPINE WO CONTRAST  Result Date: 04/13/2019 CLINICAL DATA:  Worsening low back pain with difficulty walking. EXAM: MRI LUMBAR SPINE WITHOUT CONTRAST TECHNIQUE: Multiplanar, multisequence MR imaging of the lumbar spine was performed. No intravenous contrast was administered. COMPARISON:  Lumbar spine radiographs 02/26/2019 FINDINGS: Segmentation:  Standard. Alignment:  Minimal retrolisthesis of L5 on S1. Vertebrae: No fracture or suspicious marrow lesion. Degenerative endplate changes at L5-S1 with an  S1 superior endplate Schmorl's node. Conus medullaris and cauda equina: Conus extends to the L1-2 level. Conus and cauda equina appear normal. Paraspinal and other soft tissues: Partially visualized 1 cm T2 hyperintense focus in the right kidney, likely a cyst. Disc levels: L1-2: Negative. L2-3: Disc desiccation and mild disc space narrowing. Disc bulging slightly greater to the right results in borderline to mild right lateral recess stenosis without significant spinal or neural foraminal stenosis. L3-4: Mild disc bulging without stenosis. L4-5: Disc desiccation and moderate disc space narrowing. Circumferential disc bulging slightly greater to the left results in mild bilateral lateral recess stenosis and minimal right and mild left neural foraminal stenosis without spinal stenosis. L5-S1: Disc desiccation and moderate disc space narrowing. Circumferential disc bulging and endplate spurring result in minimal right and mild left neural foraminal stenosis without spinal stenosis. IMPRESSION: Multilevel lumbar disc degeneration with mild lateral recess and neural foraminal stenosis as above. No high-grade stenosis. Electronically Signed   By: Sebastian Ache M.D.   On: 04/13/2019 19:35    Procedures Procedures (including critical care time)  Medications Ordered in ED Medications  HYDROmorphone (DILAUDID) injection 2 mg (has no administration in time range)    ED Course  I have reviewed the triage vital signs and the nursing notes.  Pertinent labs & imaging results that were available during my care of the patient were reviewed by me and considered in my medical decision making (see chart for details).    MDM Rules/Calculators/A&P                     59 year old male with worsening lumbar back pain now with weakness in bilateral upper extremities.  Patient is using a cane to walk.  Patient is able to walk but is bent over and walks very slowly while dragging his feet.  4-5 strength in his bilateral  lower extremities but no sensory complaints.  Low suspicion for infectious etiology  as patient is not an IV drug user has no fevers or chills and no localized lumbar tenderness.  We will do an MRI to evaluate for cord compression.  Patient is denying any bowel or bladder issues.  8:31 PM Labs are within normal limits.  MRI shows multilevel lumbar disc degeneration with mild lateral recess and neural foraminal stenosis but no high-grade stenosis.  After pain medicine patient is feeling much better.  Feel that patient is stable for discharge.  Encouraged him to follow-up with his doctor and call tomorrow for a follow-up appointment.  Patient had received some tramadol in the past for the pain and states it helped but he does not like strong painkillers.  Last filled a prescription over a month ago.  Will give 1 short prescription until he can follow-up with his doctor.  Final Clinical Impression(s) / ED Diagnoses Final diagnoses:  Chronic bilateral low back pain with bilateral sciatica  Degenerative disc disease, lumbar    Rx / DC Orders ED Discharge Orders         Ordered    traMADol (ULTRAM) 50 MG tablet  Every 6 hours PRN     04/13/19 2034           Gwyneth Sprout, MD 04/13/19 2047

## 2019-04-13 NOTE — ED Triage Notes (Signed)
Pt. Stated, Im having back pain started a week ago but really 20 years, Dr. Rhina Brackett me here.

## 2019-04-13 NOTE — Discharge Instructions (Addendum)
The MRI today showed a lot of arthritis in your back and some narrowing but your spinal cord is normal.  You can continue to do Tylenol take the pain medication and muscle relaxer as needed.  Also a heating pad and muscle rubs may also be helpful.

## 2019-04-13 NOTE — ED Notes (Signed)
Patient transferred to MRI 

## 2019-04-14 ENCOUNTER — Other Ambulatory Visit: Payer: Self-pay

## 2019-04-14 ENCOUNTER — Ambulatory Visit: Payer: Medicaid Other | Admitting: Physical Therapy

## 2019-04-14 VITALS — BP 190/119 | HR 80

## 2019-04-14 DIAGNOSIS — R2689 Other abnormalities of gait and mobility: Secondary | ICD-10-CM | POA: Insufficient documentation

## 2019-04-14 DIAGNOSIS — Z9181 History of falling: Secondary | ICD-10-CM | POA: Insufficient documentation

## 2019-04-14 DIAGNOSIS — R2681 Unsteadiness on feet: Secondary | ICD-10-CM | POA: Insufficient documentation

## 2019-04-14 NOTE — Therapy (Signed)
[facility] [address] [place], [place], [address] Phone: [phone]   Fax:  [phone]  Physical Therapy Treatment - No Charge  Patient Details  Name: Adrian Alexander MRN: [id number] Date of Birth: [birth year] Referring Provider (PT): Jamison Oka   Encounter Date: 04/14/2019  PT End of Session - 04/14/19 1128    Visit Number  3   no charge due to high BP   Number of Visits  9    Authorization Type  CCME approved 3 PT visits 04/07/19-04/27/19    Authorization - Visit Number  0    Authorization - Number of Visits  3    PT Start Time  1057    PT Stop Time  1123    PT Time Calculation (min)  26 min    Activity Tolerance  --   unable to participate in PT due to high BP      Past Medical History:  Diagnosis Date  . Anxiety   . Bipolar affective disorder, depressed (HCC)   . Hypertension   . Schizo-affective schizophrenia (HCC)   . Schizophrenia, schizo-affective (HCC)   . Seizure (HCC)   . Seizures (HCC)    Last month. gets a sensation    Past Surgical History:  Procedure Laterality Date  . ANKLE SURGERY    . CLOSED REDUCTION WITH HUMER PIN INSERTION     left ankle 1998  . HERNIA REPAIR     while in prison  . HERNIA REPAIR      Vitals:   04/14/19 1106  BP: (!) 190/119  Pulse: 80    Subjective Assessment - 04/14/19 1100    Subjective  Went to the ED last night because of severe back pain and had trouble walking/moving his right leg. MRI of lumbar spine from 04/13/19: Multilevel lumbar disc degeneration with mild lateral recess andneural foraminal stenosis as above. No high-grade stenosis. Was also prescribed Tramadol for pain. The back and legs are feeling much better, had trouble standing up the past couple of days due to back pain. Had about 2 falls.    Pertinent History  bipolar disorder, HTN, schizophrenia, hx of seizures    How long can you walk comfortably?  5-10 minutes.    Diagnostic tests   MRI of lumbar spine from 04/13/19: Multilevel lumbar disc degeneration with mild lateral recess andneural foraminal stenosis as above. No high-grade stenosis.    Patient Stated Goals  wants to improve his walking, loosen up his low back and his legs.                                 PT Short Term Goals - 03/17/19 1238      PT SHORT TERM GOAL #1   Title  Patient will be independent with initial HEP for balance and ROM. ALL STGS DUE AFTER 6TH VISIT.    Baseline  pt reports that he has been performing his exercises at home - will continue to benefit from additions    Status  On-going      PT SHORT TERM GOAL #2   Title  Patient will decrease 5x sit <> stand time to 19 seconds or less without BUE support from lower arm chair to demo improved LE strength.    Baseline  22.97 seconds from standard height chair without UE support on 03/17/19 (previously 19 seconds)    Status  On-going  PT SHORT TERM GOAL #3   Title  Patient will improve gait speed with SPC to at least 2.6 ft/sec to demonstrate improved community mobility.    Baseline  with SPC 15.66 seconds = 2.09 ft/sec (previously 2.46 ft/sec on 01/23/19)    Status  Revised      PT SHORT TERM GOAL #4   Title  Patient will improve FGA score to at least a 15/30 to demo decreased fall risk.    Baseline  12/30 on 02/17/19 (previously 12/30 on 01/23/19)    Status  Revised      PT SHORT TERM GOAL #5   Title  Patient will report worse back pain to 4/10 or less in order to improve functional mobility.    Baseline  6/10 on 03/17/19    Status  On-going        PT Long Term Goals - 03/19/19 0815      PT LONG TERM GOAL #1   Title  Patient will be independent with final HEP for balance and ROM. ALL LTGS DUE AFTER 9TH VISIT.    Baseline  ongoing with HEP    Status  On-going      PT LONG TERM GOAL #2   Title  Patient will ambulate at least 38' with supervision and no LOB with SPC while scanning environment in order to  demo improved community mobility.    Baseline  not yet assessed.    Status  On-going      PT LONG TERM GOAL #3   Title  Patient will decrease 5x sit <> stand time to 17 seconds or less without BUE support from lower arm chair to demo improved LE strength.    Baseline  22.97 seconds from standard height chair without UE support on 03/17/19    Status  On-going      PT LONG TERM GOAL #4   Title  Patient will improve baseline BERG score by at least 4 points in order to demo decreased fall risk.    Baseline  not yet assessed - unable to assess due to BP in therapy sessions    Status  New      PT LONG TERM GOAL #5   Title  Patient will improve gait speed with SPC to at least 3.0 ft/sec with [facility] to demonstrate improved community mobility.    Baseline  2.09 ft/sec with SPC on 02/17/19    Status  Revised      PT LONG TERM GOAL #6   Title  Patient will perform at least 12 steps with mod I with step to pattern vs step through ascending/descending with BUE on handrails in order to safely perform a flight of stairs at his apartment.    Baseline  8 steps with BUE support and supervision/min guard with step to pattern.    Status  On-going            Plan - 04/14/19 1136    Clinical Impression Statement  Assessed pt's BP at beginning of session (see vitals) - rechecked with manual cuff and pt's BP was 180/118. Pt asymptomatic and reports that he took his BP medication this morning and his next dose is at noon. Therapist called pt's PCP's office and made them aware of pt's BP reading this morning at therapy - they will let Dr. Maia Petties know and will give the pt a call. Pt's BP has been too elevated at previous sessions, last time pt was here at the end of December, pt's  BP was too low and was light headed. Instructed pt that if he has any signs/symptoms of a CVA then to go to the ER immediately - pt verbalized understanding. Will put pt on hold until his BP is under control with medication. Educated pt  on BP readings that would be too high to participate in therapy (>180/110), with pt verbalizing understanding. Will resume therapy and continue to progress towards LTGs when pt's BP is appropriate.    Personal Factors and Comorbidities  Past/Current Experience;Time since onset of injury/illness/exacerbation    Examination-Activity Limitations  Locomotion Level;Stand;Stairs;Squat    Examination-Participation Restrictions  Community Activity   outdoor activities   Stability/Clinical Decision Making  Evolving/Moderate complexity    Rehab Potential  Fair    PT Frequency  1x / week   followed by 1x week for 3 weeks   PT Duration  3 weeks    PT Treatment/Interventions  ADLs/Self Care Home Management;Aquatic Therapy;Therapeutic exercise;Therapeutic activities;Functional mobility training;Stair training;Gait training;DME Instruction;Balance training;Neuromuscular re-education;Patient/family education;Energy conservation    PT Next Visit Plan  is BP appropriate? how is HEP? - add low back stretches as appropriate. try performing BERG. balance, gait training with SPC, LE strengthening    PT Home Exercise Plan  [id number]    Consulted and Agree with Plan of Care  Patient       Patient will benefit from skilled therapeutic intervention in order to improve the following deficits and impairments:  Abnormal gait, Decreased activity tolerance, Decreased balance, Decreased coordination, Difficulty walking, Decreased strength, Postural dysfunction, Pain, Decreased mobility  Visit Diagnosis: History of falling  Other abnormalities of gait and mobility  Unsteadiness on feet  Chronic bilateral low back pain without sciatica     Problem List Patient Active Problem List   Diagnosis Date Noted  . Chest pain 03/29/2017  . Gastritis 03/29/2017  . Hematochezia 03/29/2017  . Hematemesis 03/29/2017  . Alcohol dependence with withdrawal, uncomplicated (HCC) 01/25/2015  . Post traumatic stress disorder (PTSD)  01/25/2015  . Schizoaffective disorder (HCC) 01/25/2015  . Cocaine abuse with cocaine-induced mood disorder (HCC) 01/25/2015  . MDD (major depressive disorder) 01/24/2015  . Substance induced mood disorder (HCC) 09/10/2011    Drake Leach, PT, DPT  04/14/2019, 11:37 AM  [facility] [address] [place], [place], [address] Phone: [phone]   Fax:  [phone]  Name: Adrian Alexander MRN: [id number] Date of Birth: [birth year]

## 2019-04-21 ENCOUNTER — Ambulatory Visit: Payer: Medicaid Other | Admitting: Physical Therapy

## 2019-05-07 ENCOUNTER — Encounter: Payer: Medicaid Other | Admitting: Infectious Diseases

## 2019-05-21 DIAGNOSIS — R768 Other specified abnormal immunological findings in serum: Secondary | ICD-10-CM | POA: Insufficient documentation

## 2019-05-21 DIAGNOSIS — F39 Unspecified mood [affective] disorder: Secondary | ICD-10-CM | POA: Insufficient documentation

## 2019-05-27 ENCOUNTER — Encounter: Payer: Medicaid Other | Admitting: Infectious Diseases

## 2019-05-27 NOTE — Progress Notes (Deleted)
Patient Name: Adrian Alexander  Date of Birth: [birth year]  MRN: [id number]  PCP: Audley Hose, MD  Referring Provider: Audley Hose, MD, Ph#: [phone]   Patient Active Problem List   Diagnosis Date Noted  . Chest pain 03/29/2017  . Gastritis 03/29/2017  . Hematochezia 03/29/2017  . Hematemesis 03/29/2017  . Alcohol dependence with withdrawal, uncomplicated ([place]) 73/41/9379  . Post traumatic stress disorder (PTSD) 01/25/2015  . Schizoaffective disorder ([place]) 01/25/2015  . Cocaine abuse with cocaine-induced mood disorder ([place]) 01/25/2015  . MDD (major depressive disorder) 01/24/2015  . Substance induced mood disorder ([place]) 09/10/2011    CC:  New patient - initial evaluation and management of chronic hepatitis C infection. ***    HPI/ROS:  Adrian Alexander is a 59 y.o. male is a 59 y.o. male.   Patient tested positive {time; misc:30499}. Hepatitis C-associated risk factors present are: {hep c risks pos:13207}. Patient denies {hep c risks neg:13208}. Patient {has/not:15037} had other studies performed. Results: {hep c studies:13209}. Patient {has/not:15037} had prior treatment for Hepatitis C. Patient {does/do/not:33181} have a past history of liver disease. Patient {does/do/not:33181} have a family history of liver disease. Patient {does/do/not:33181}  have associated signs or symptoms related to liver disease.  Labs reviewed and confirm chronic hepatitis C with a positive viral load.   Records reviewed from ***  ***  Patient {does/does not:19866} have documented immunity to Hepatitis A. Patient {does/does not:19867} have documented immunity to Hepatitis B.    {ros - complete:22885} All other systems reviewed and are negative      Past Medical History:  Diagnosis Date  . Anxiety   . Bipolar affective disorder, depressed ([place])   . Hypertension   . Schizo-affective schizophrenia ([place])   . Schizophrenia, schizo-affective ([place])   . Seizure ([place])   . Seizures  ([place])    Last month. gets a sensation    Prior to Admission medications   Medication Sig Start Date End Date Taking? Authorizing Provider  cholecalciferol (VITAMIN D) 1000 UNITS tablet Take 2 tablets (2,000 Units total) by mouth daily. Patient not taking: Reported on 03/29/2017 01/27/15   Derrill Center, NP  cloNIDine (CATAPRES) 0.3 MG tablet Take 1 tablet (0.3 mg total) by mouth 2 (two) times daily. Patient taking differently: Take 0.3 mg by mouth 3 (three) times daily.  02/07/15   Micheline Chapman, NP  cyclobenzaprine (FLEXERIL) 5 MG tablet Take 5 mg by mouth 3 (three) times daily. 03/14/17   [provider]  diclofenac Sodium (VOLTAREN) 1 % GEL Apply 2 g topically 4 (four) times daily. 04/09/19   Tedd Sias, PA  gabapentin (NEURONTIN) 400 MG capsule TAKE ONE CAPSULE BY MOUTH THREE TIMES DAILY 05/30/15   Micheline Chapman, NP  hydrochlorothiazide (HYDRODIURIL) 25 MG tablet Take 25 mg by mouth daily. 03/14/17   [provider]  lisinopril (PRINIVIL,ZESTRIL) 40 MG tablet Take 40 mg by mouth daily. 03/14/17   [provider]  Multiple Vitamin (MULTIVITAMIN WITH MINERALS) TABS tablet Take 1 tablet by mouth daily. Patient not taking: Reported on 03/29/2017 01/27/15   Derrill Center, NP  pantoprazole (PROTONIX) 40 MG tablet Take 2 tablets (80 mg total) by mouth 2 (two) times daily. 03/30/17   Ina Homes, MD  phenytoin (DILANTIN) 100 MG ER capsule Take 100 mg by mouth daily. 03/14/17   [provider]  QUEtiapine (SEROQUEL) 400 MG tablet Take 400 mg by mouth at bedtime. 02/21/17   [provider]  traMADol (ULTRAM) 50 MG tablet  Take 1 tablet (50 mg total) by mouth every 6 (six) hours as needed. 04/13/19   Gwyneth Sprout, MD    No Known Allergies  Social History   Tobacco Use  . Smoking status: Former Smoker    Packs/day: 0.10    Years: 20.00    Pack years: 2.00    Types: Cigarettes  . Smokeless tobacco: Current User    Types: Snuff   Substance Use Topics  . Alcohol use: Yes    Alcohol/week: 42.0 standard drinks    Types: 42 Cans of beer per week  . Drug use: Yes    Types: Marijuana, Cocaine    Family History  Problem Relation Age of Onset  . Cancer Mother 18       colon  . Heart disease Father   . Heart attack Father 86   ***  Objective:  There were no vitals filed for this visit. Constitutional: {EXAM; GENERAL APPEARANCE:5021} Eyes: anicteric Cardiovascular: {Mis exam cardio:32073} Respiratory: {Exam; lungs brief:12271} Gastrointestinal: {Exam; abdomen:5794::"Bowel sounds are normal","liver is not enlarged","spleen is not enlarged"} Musculoskeletal: {extremities:315109::"peripheral pulses normal, no pedal edema, no clubbing or cyanosis"} Skin: {Skin exam gi:12013}; no porphyria cutanea tarda Lymphatic: no cervical lymphadenopathy   Laboratory: Genotype: No results found for: HCVGENOTYPE HCV viral load: No results found for: HCVQUANT Lab Results  Component Value Date   WBC 4.0 04/13/2019   HGB 13.8 04/13/2019   HCT 43.0 04/13/2019   MCV 80.7 04/13/2019   PLT 233 04/13/2019    Lab Results  Component Value Date   CREATININE 0.96 04/13/2019   BUN 10 04/13/2019   NA 141 04/13/2019   K 4.0 04/13/2019   CL 106 04/13/2019   CO2 25 04/13/2019    Lab Results  Component Value Date   ALT 22 11/07/2017   AST 20 11/07/2017   ALKPHOS 36 (L) 11/07/2017    Lab Results  Component Value Date   BILITOT 0.7 11/07/2017   ALBUMIN 3.4 (L) 11/07/2017    APRI ***   FIB-4 ***   Imaging:  ***  Assessment & Plan:   Problem List Items Addressed This Visit    None      I spent 45 minutes with the patient including greater than 70% of time in face to face counsel of the patient re hepatitis c and the details described above and in coordination of their care.  Rexene Alberts, MSN, NP-C [facility]  [email or web address] Pager:  [phone] Office: [phone] [facility] Main Line: [phone]

## 2019-08-05 ENCOUNTER — Emergency Department (HOSPITAL_COMMUNITY)
Admission: EM | Admit: 2019-08-05 | Discharge: 2019-08-07 | Disposition: A | Discharge status: Home / Self Care | Payer: Medicaid Other | Attending: Emergency Medicine | Admitting: Emergency Medicine

## 2019-08-05 DIAGNOSIS — R45851 Suicidal ideations: Secondary | ICD-10-CM

## 2019-08-05 DIAGNOSIS — F322 Major depressive disorder, single episode, severe without psychotic features: Secondary | ICD-10-CM

## 2019-08-05 DIAGNOSIS — Z79899 Other long term (current) drug therapy: Secondary | ICD-10-CM | POA: Diagnosis not present

## 2019-08-05 DIAGNOSIS — F1010 Alcohol abuse, uncomplicated: Secondary | ICD-10-CM

## 2019-08-05 DIAGNOSIS — Z87891 Personal history of nicotine dependence: Secondary | ICD-10-CM | POA: Insufficient documentation

## 2019-08-05 DIAGNOSIS — F1994 Other psychoactive substance use, unspecified with psychoactive substance-induced mood disorder: Secondary | ICD-10-CM | POA: Diagnosis present

## 2019-08-05 DIAGNOSIS — F329 Major depressive disorder, single episode, unspecified: Secondary | ICD-10-CM | POA: Insufficient documentation

## 2019-08-05 DIAGNOSIS — Z20822 Contact with and (suspected) exposure to covid-19: Secondary | ICD-10-CM | POA: Diagnosis not present

## 2019-08-05 DIAGNOSIS — F250 Schizoaffective disorder, bipolar type: Secondary | ICD-10-CM | POA: Insufficient documentation

## 2019-08-05 LAB — COMPREHENSIVE METABOLIC PANEL
ALT: 27 U/L (ref 0–44)
AST: 32 U/L (ref 15–41)
Albumin: 4.1 g/dL (ref 3.5–5.0)
Alkaline Phosphatase: 47 U/L (ref 38–126)
Anion gap: 12 (ref 5–15)
BUN: 8 mg/dL (ref 6–20)
CO2: 22 mmol/L (ref 22–32)
Calcium: 8.7 mg/dL — ABNORMAL LOW (ref 8.9–10.3)
Chloride: 106 mmol/L (ref 98–111)
Creatinine, Ser: 0.9 mg/dL (ref 0.61–1.24)
GFR calc Af Amer: >60 mL/min (ref >60)
GFR calc non Af Amer: >60 mL/min (ref >60)
Glucose, Bld: 106 mg/dL — ABNORMAL HIGH (ref 70–99)
Potassium: 3.7 mmol/L (ref 3.5–5.1)
Sodium: 140 mmol/L (ref 135–145)
Total Bilirubin: 0.5 mg/dL (ref 0.3–1.2)
Total Protein: 7.1 g/dL (ref 6.5–8.1)

## 2019-08-05 LAB — CBC
HCT: 47.2 % (ref 39.0–52.0)
Hemoglobin: 15.3 g/dL (ref 13.0–17.0)
MCH: 26.5 pg (ref 26.0–34.0)
MCHC: 32.4 g/dL (ref 30.0–36.0)
MCV: 81.7 fL (ref 80.0–100.0)
Platelets: 245 10*3/uL (ref 150–400)
RBC: 5.78 MIL/uL (ref 4.22–5.81)
RDW: 14.3 % (ref 11.5–15.5)
WBC: 4.7 10*3/uL (ref 4.0–10.5)
nRBC: 0 % (ref 0.0–0.2)

## 2019-08-05 LAB — RAPID URINE DRUG SCREEN, HOSP PERFORMED
Amphetamines: NOT DETECTED
Barbiturates: NOT DETECTED
Benzodiazepines: NOT DETECTED
Cocaine: NOT DETECTED
Opiates: NOT DETECTED
Tetrahydrocannabinol: POSITIVE — AB

## 2019-08-05 LAB — ETHANOL: Alcohol, Ethyl (B): 14 mg/dL — ABNORMAL HIGH (ref <10)

## 2019-08-05 LAB — SARS CORONAVIRUS 2 BY RT PCR (HOSPITAL ORDER, PERFORMED IN ~~LOC~~ HOSPITAL LAB): SARS Coronavirus 2: NEGATIVE

## 2019-08-05 LAB — SALICYLATE LEVEL: Salicylate Lvl: <7 mg/dL — ABNORMAL LOW (ref 7.0–30.0)

## 2019-08-05 LAB — ACETAMINOPHEN LEVEL: Acetaminophen (Tylenol), Serum: <10 ug/mL — ABNORMAL LOW (ref 10–30)

## 2019-08-05 MED ORDER — THIAMINE HCL 100 MG PO TABS
100.0000 mg | ORAL_TABLET | Freq: Every day | ORAL | Status: DC
  Administered 2019-08-05 – 2019-08-07 (×3): 100 mg via ORAL
  Filled 2019-08-05 (×3): qty 1

## 2019-08-05 MED ORDER — GABAPENTIN 400 MG PO CAPS
400.0000 mg | ORAL_CAPSULE | Freq: Three times a day (TID) | ORAL | Status: DC
  Administered 2019-08-05 – 2019-08-07 (×8): 400 mg via ORAL
  Filled 2019-08-05 (×8): qty 1

## 2019-08-05 MED ORDER — ONDANSETRON HCL 4 MG PO TABS: 4.0000 mg | ORAL_TABLET | Freq: Three times a day (TID) | ORAL | Status: DC | PRN

## 2019-08-05 MED ORDER — ACETAMINOPHEN 325 MG PO TABS
650.0000 mg | ORAL_TABLET | ORAL | Status: DC | PRN
  Administered 2019-08-05 – 2019-08-06 (×2): 650 mg via ORAL
  Filled 2019-08-05 (×2): qty 2

## 2019-08-05 MED ORDER — LORAZEPAM 2 MG/ML IJ SOLN: 0.0000 mg | Freq: Two times a day (BID) | INTRAMUSCULAR | Status: DC

## 2019-08-05 MED ORDER — LORAZEPAM 1 MG PO TABS
0.0000 mg | ORAL_TABLET | Freq: Two times a day (BID) | ORAL | Status: DC
  Filled 2019-08-05: qty 2

## 2019-08-05 MED ORDER — ADULT MULTIVITAMIN W/MINERALS CH
1.0000 | ORAL_TABLET | Freq: Every day | ORAL | Status: DC
  Administered 2019-08-05 – 2019-08-07 (×3): 1 via ORAL
  Filled 2019-08-05 (×3): qty 1

## 2019-08-05 MED ORDER — LISINOPRIL 20 MG PO TABS
40.0000 mg | ORAL_TABLET | Freq: Every day | ORAL | Status: DC
  Administered 2019-08-05 – 2019-08-07 (×3): 40 mg via ORAL
  Filled 2019-08-05 (×3): qty 2

## 2019-08-05 MED ORDER — LORAZEPAM 2 MG/ML IJ SOLN: 0.0000 mg | Freq: Four times a day (QID) | INTRAMUSCULAR | Status: AC

## 2019-08-05 MED ORDER — THIAMINE HCL 100 MG/ML IJ SOLN: 100.0000 mg | Freq: Every day | INTRAMUSCULAR | Status: DC

## 2019-08-05 MED ORDER — LORAZEPAM 1 MG PO TABS
0.0000 mg | ORAL_TABLET | Freq: Four times a day (QID) | ORAL | Status: AC
  Administered 2019-08-05 (×2): 1 mg via ORAL
  Administered 2019-08-06: 2 mg via ORAL
  Filled 2019-08-05 (×2): qty 1

## 2019-08-05 MED ORDER — QUETIAPINE FUMARATE 400 MG PO TABS
400.0000 mg | ORAL_TABLET | Freq: Every day | ORAL | Status: DC
  Administered 2019-08-05 – 2019-08-06 (×2): 400 mg via ORAL
  Filled 2019-08-05: qty 2
  Filled 2019-08-05 (×2): qty 1

## 2019-08-05 MED ORDER — PANTOPRAZOLE SODIUM 40 MG PO TBEC
80.0000 mg | DELAYED_RELEASE_TABLET | Freq: Two times a day (BID) | ORAL | Status: DC
  Administered 2019-08-05 – 2019-08-07 (×5): 80 mg via ORAL
  Filled 2019-08-05 (×5): qty 2

## 2019-08-05 MED ORDER — ALUM & MAG HYDROXIDE-SIMETH 200-200-20 MG/5ML PO SUSP: 30.0000 mL | Freq: Four times a day (QID) | ORAL | Status: DC | PRN

## 2019-08-05 NOTE — Progress Notes (Signed)
Pt meets inpatient criteria. Referral information has been sent to the following hospitals for review:  [facility] Details [facility] Details [facility] Details [facility] Details [facility] Details [facility] Details [facility] Details [facility]   Disposition will continue to follow.   Wells Guiles, LCSW, LCAS Disposition CSW [facility] [phone]

## 2019-08-05 NOTE — ED Notes (Addendum)
one bag of items placed in locker number 2

## 2019-08-05 NOTE — ED Notes (Signed)
Lunch Tray Ordered @ 1434.

## 2019-08-05 NOTE — ED Triage Notes (Signed)
Pt here from home with c/o SI and depression , NO HI , pt states that he was drinking a lot last night , no drug use

## 2019-08-05 NOTE — ED Provider Notes (Signed)
[facility] EMERGENCY DEPARTMENT Provider Note   CSN: [id number] Arrival date & time: 08/05/19  0715     History No chief complaint on file.   Adrian Alexander is a 59 y.o. male.  HPI Patient reports a lot of things are going on in his life right now.  He does not specify but reports he has been feeling very depressed and has extreme lack of energy.  He reports he has been having thoughts of killing himself for a couple weeks now.  He does not have a plan.  He reports he has been drinking heavily for the past couple of weeks, drinking up to 1/5 of liquor every day.  He denies history of withdrawal seizures.  He reports he has had loss of appetite and sometimes some nausea with vomiting.  He reports he feels generally achy all over.  He has not had any cough or shortness of breath.  He has no thoughts of hurting or killing anyone else.  He reports he had his first Covid vaccine a week ago.    Past Medical History:  Diagnosis Date  . Anxiety   . Bipolar affective disorder, depressed ([facility])   . Hypertension   . Schizo-affective schizophrenia ([facility])   . Schizophrenia, schizo-affective ([facility])   . Seizure ([facility])   . Seizures ([facility])    Last month. gets a sensation    Patient Active Problem List   Diagnosis Date Noted  . Hepatitis C antibody test positive 05/21/2019  . Chest pain 03/29/2017  . Gastritis 03/29/2017  . Hematochezia 03/29/2017  . Hematemesis 03/29/2017  . Alcohol dependence with withdrawal, uncomplicated ([facility]) 01/25/2015  . Post traumatic stress disorder (PTSD) 01/25/2015  . Schizoaffective disorder ([facility]) 01/25/2015  . Cocaine abuse with cocaine-induced mood disorder ([facility]) 01/25/2015  . MDD (major depressive disorder) 01/24/2015  . Substance induced mood disorder ([facility]) 09/10/2011    Past Surgical History:  Procedure Laterality Date  . ANKLE SURGERY    . CLOSED REDUCTION WITH HUMER PIN INSERTION     left ankle 1998  . HERNIA REPAIR     while in  prison  . HERNIA REPAIR         Family History  Problem Relation Age of Onset  . Cancer Mother 55       colon  . Heart disease Father   . Heart attack Father 72    Social History   Tobacco Use  . Smoking status: Former Smoker    Packs/day: 0.10    Years: 20.00    Pack years: 2.00    Types: Cigarettes  . Smokeless tobacco: Current User    Types: Snuff  Substance Use Topics  . Alcohol use: Yes    Alcohol/week: 42.0 standard drinks    Types: 42 Cans of beer per week  . Drug use: Yes    Types: Marijuana, Cocaine    Home Medications Prior to Admission medications   Medication Sig Start Date End Date Taking? Authorizing Provider  cholecalciferol (VITAMIN D) 1000 UNITS tablet Take 2 tablets (2,000 Units total) by mouth daily. Patient not taking: Reported on 03/29/2017 01/27/15   [name], NP  cloNIDine (CATAPRES) 0.3 MG tablet Take 1 tablet (0.3 mg total) by mouth 2 (two) times daily. Patient taking differently: Take 0.3 mg by mouth 3 (three) times daily.  02/07/15   Henrietta Hoover, NP  cyclobenzaprine (FLEXERIL) 5 MG tablet Take 5 mg by mouth 3 (three) times daily. 03/14/17   [provider]  diclofenac Sodium (VOLTAREN) 1 % GEL Apply 2 g topically 4 (four) times daily. 04/09/19   Gailen Shelter, PA  gabapentin (NEURONTIN) 400 MG capsule TAKE ONE CAPSULE BY MOUTH THREE TIMES DAILY 05/30/15   Henrietta Hoover, NP  hydrochlorothiazide (HYDRODIURIL) 25 MG tablet Take 25 mg by mouth daily. 03/14/17   [provider]  lisinopril (PRINIVIL,ZESTRIL) 40 MG tablet Take 40 mg by mouth daily. 03/14/17   [provider]  Multiple Vitamin (MULTIVITAMIN WITH MINERALS) TABS tablet Take 1 tablet by mouth daily. Patient not taking: Reported on 03/29/2017 01/27/15   [name], NP  pantoprazole (PROTONIX) 40 MG tablet Take 2 tablets (80 mg total) by mouth 2 (two) times daily. 03/30/17   Levora Dredge, MD  phenytoin (DILANTIN) 100 MG ER capsule Take  100 mg by mouth daily. 03/14/17   [provider]  QUEtiapine (SEROQUEL) 400 MG tablet Take 400 mg by mouth at bedtime. 02/21/17   [provider]  traMADol (ULTRAM) 50 MG tablet Take 1 tablet (50 mg total) by mouth every 6 (six) hours as needed. 04/13/19   Gwyneth Sprout, MD    Allergies    Patient has no known allergies.  Review of Systems   Review of Systems 10 systems reviewed and negative except as per HPI. Physical Exam Updated Vital Signs BP (!) 160/107 (BP Location: Left Arm)   Pulse 78   Temp 97.9 F (36.6 C) (Oral)   Resp 14   Ht 6\' 1"  (1.854 m)   Wt 90.7 kg   SpO2 99%   BMI 26.39 kg/m   Physical Exam Constitutional:      Appearance: He is well-developed.  HENT:     Head: Normocephalic and atraumatic.  Eyes:     Extraocular Movements: Extraocular movements intact.  Cardiovascular:     Rate and Rhythm: Normal rate and regular rhythm.     Heart sounds: Normal heart sounds.  Pulmonary:     Effort: Pulmonary effort is normal.     Breath sounds: Normal breath sounds.  Abdominal:     General: Bowel sounds are normal. There is no distension.     Palpations: Abdomen is soft.     Tenderness: There is no abdominal tenderness.  Musculoskeletal:        General: Normal range of motion.     Cervical back: Neck supple.  Skin:    General: Skin is warm and dry.  Neurological:     Mental Status: He is alert and oriented to person, place, and time.     GCS: GCS eye subscore is 4. GCS verbal subscore is 5. GCS motor subscore is 6.     Coordination: Coordination normal.  Psychiatric:     Comments: Affect is flat.  Patient has poor eye contact.     ED Results / Procedures / Treatments   Labs (all labs ordered are listed, but only abnormal results are displayed) Labs Reviewed  COMPREHENSIVE METABOLIC PANEL - Abnormal; Notable for the following components:      Result Value   Glucose, Bld 106 (*)    Calcium 8.7 (*)    All other components within  normal limits  ETHANOL - Abnormal; Notable for the following components:   Alcohol, Ethyl (B) 14 (*)    All other components within normal limits  SALICYLATE LEVEL - Abnormal; Notable for the following components:   Salicylate Lvl <7.0 (*)    All other components within normal limits  ACETAMINOPHEN LEVEL -  Abnormal; Notable for the following components:   Acetaminophen (Tylenol), Serum <10 (*)    All other components within normal limits  RAPID URINE DRUG SCREEN, HOSP PERFORMED - Abnormal; Notable for the following components:   Tetrahydrocannabinol POSITIVE (*)    All other components within normal limits  SARS CORONAVIRUS 2 BY RT PCR (HOSPITAL ORDER, [place] [facility])  CBC    EKG None  Radiology No results found.  Procedures Procedures (including critical care time)  Medications Ordered in ED Medications  LORazepam (ATIVAN) injection 0-4 mg (has no administration in time range)    Or  LORazepam (ATIVAN) tablet 0-4 mg (has no administration in time range)  LORazepam (ATIVAN) injection 0-4 mg (has no administration in time range)    Or  LORazepam (ATIVAN) tablet 0-4 mg (has no administration in time range)  thiamine tablet 100 mg (has no administration in time range)    Or  thiamine (B-1) injection 100 mg (has no administration in time range)  acetaminophen (TYLENOL) tablet 650 mg (has no administration in time range)  ondansetron (ZOFRAN) tablet 4 mg (has no administration in time range)  alum & mag hydroxide-simeth (MAALOX/MYLANTA) 200-200-20 MG/5ML suspension 30 mL (has no administration in time range)  gabapentin (NEURONTIN) capsule 400 mg (has no administration in time range)  lisinopril (ZESTRIL) tablet 40 mg (has no administration in time range)  multivitamin with minerals tablet 1 tablet (has no administration in time range)  pantoprazole (PROTONIX) EC tablet 80 mg (has no administration in time range)  QUEtiapine (SEROQUEL) tablet 400 mg (has  no administration in time range)    ED Course  I have reviewed the triage vital signs and the nursing notes.  Pertinent labs & imaging results that were available during my care of the patient were reviewed by me and considered in my medical decision making (see chart for details).    MDM Rules/Calculators/A&P                      Patient presents with severe depression with suicidal ideations.  Also recent increase use of alcohol.  Patient does not show any signs of being acutely intoxicated.  His mental status is clear.  No signs of withdrawal at this time.  His affect is very flat and eye contact is poor.  Will consult TTS for suicidal ideation.  Patient reports he has been generally achy and had some vomiting intermittently.  Labs are within normal limits without showing significant dehydration.  Patient had first immunization for Covid but only a week ago.  Covid testing pending.  Otherwise, patient is nontoxic in appearance and I have low suspicion for acute medical condition.  Pending Covid, patient is medically cleared for TTS consult and disposition.  Patient reports he has been noncompliant with all of his medications.  Will restart select home meds.  Final Clinical Impression(s) / ED Diagnoses Final diagnoses:  Current severe episode of major depressive disorder without psychotic features, unspecified whether recurrent ([place])  Alcohol abuse  Suicidal ideation    Rx / DC Orders ED Discharge Orders    None       Charlesetta Shanks, MD 08/05/19 1100

## 2019-08-05 NOTE — BH Assessment (Addendum)
Assessment Note  Adrian Alexander is an 59 y.o. male.   Diagnosis: Schizo-affective/Schizophrenia, Bipolar Affective disorder, Depressed, Anxiety (per past medical history documentation) and Substance Use Disorder. Patient presents to [facility], voluntarily. He is a self referral. Patient drove his personal car to the hospital. Patient presents to [facility] with suicidal ideations. Onset of suicidal thoughts started yesterday, 08/04/19. When asked about what may have triggered his suicidal thoughts or stressors he states, "Everything...stress". Patient would not elaborate or provide any further details. He has tried to harm himself in the past but doesn't recall when, how, or the trigger. He responds, "I don't know to all questions". However, he does report several prior attempts. Patient has a family history of mental health illness but did not want to discuss during the assessment. Patient does report the following depressive symptoms: Feeling angry/irritable, Feeling worthless/self pity, Loss of interest in usual pleasures, Guilt, Fatigue, Isolating, Tearfulness, Insomnia, Despondent. Patient's anxiety level is reported as mild.  Patient denies HI. He denies history of assaultive and/or aggressive behaviors. Patient denies legal issues. Patient reports auditory hallucinations. He was asked to provide details and states, "I can't explain, it's racing thoughts that are voices". Patient denies visual hallucinations.   Patient reports heavy use of alcohol and THC in the past week. His last use was yesterday of both substances. He has been binging on both substances in the past week. Current withdrawal symptoms include:  Agitation, Weakness, Irritability, Nausea / Vomiting, Change in blood pressure, Seizures, Cramps. He does have a history of seizures and last seizure was 6 months ago. He states that his seizure is not alcohol induced. No history of DT's. No history of black outs.   Patient receives outpatient therapy  and psychiatric services at [facility]. However, has not followed up in the past 2 months. States that he is also non compliant with medications x2 months and doesn't know the reason. Patient admitted to hospitals for inpatient treatment and stabilization in the past. States that he doesn't recall where he was hospitalized and/or his last hospitalization. He doesn't recall the reason for his past hospitalization. He does recall that the hospitalizations were due to "mental issues".   Patient's support system is his fiance. States that they live together. She helps him with his ADL's-put on his clothes due to weakness in both legs. He does have a cane. States that the can is in his car which is parked outside the hospital.   Patient oriented to time, person, place, and situation. Affect is normal. Speech is normal. Mood is depressed. His insight and judgement is poor. Impulse control is fair. Patient is dressed in scrubs.   Diagnosis:  Schizo-affective/Schizophrenia, Bipolar Affective disorder, Depressed, Anxiety (per past medical history documentation) and Substance Use Disorder.   Past Medical History:   Past Medical History:  Diagnosis Date  . Anxiety   . Bipolar affective disorder, depressed ([place])   . Hypertension   . Schizo-affective schizophrenia ([place])   . Schizophrenia, schizo-affective ([place])   . Seizure ([place])   . Seizures ([place])    Last month. gets a sensation    Past Surgical History:  Procedure Laterality Date  . ANKLE SURGERY    . CLOSED REDUCTION WITH HUMER PIN INSERTION     left ankle 1998  . HERNIA REPAIR     while in prison  . HERNIA REPAIR      Family History:  Family History  Problem Relation Age of Onset  . Cancer Mother 29  colon  . Heart disease Father   . Heart attack Father 13    Social History:  reports that he has quit smoking. His smoking use included cigarettes. He has a 2.00 pack-year smoking history. His smokeless tobacco use  includes snuff. He reports current alcohol use of about 42.0 standard drinks of alcohol per week. He reports current drug use. Drugs: Marijuana and Cocaine.  Additional Social History:  Alcohol / Drug Use Pain Medications: none Prescriptions: doesn't take as prescribed b/c can't afford Over the Counter: none History of alcohol / drug use?: Yes Longest period of sobriety (when/how long): states not sure of length Negative Consequences of Use: Financial, Personal relationships Withdrawal Symptoms: Agitation, Weakness, Irritability, Nausea / Vomiting, Change in blood pressure, Seizures, Cramps Onset of Seizures: "I don't know, I was young, I can't remember" Date of most recent seizure: 6 months ago-seizure disorder Substance #1 Name of Substance 1: Alcohol 1 - Age of First Use: "I don't remember" 1 - Amount (size/oz): "I don't know"; "I can't remember" 1 - Frequency: heavy alcohol use in the past week; daily 1 - Duration: "I've been drinking heavy for the past week"; prior to this week patient states that he was drinking "off and on" but it wasn't heavy 1 - Last Use / Amount: 08/04/19;  "I can't remember" Substance #2 Name of Substance 2: THC 2 - Age of First Use: 59 yrs old 2 - Amount (size/oz): "I don't know" 2 - Frequency: 1-2 times per week 2 - Duration: on-going 2 - Last Use / Amount: 08/04/2019; "I don't remember how much I used"  CIWA: CIWA-Ar BP: (!) 158/99 Pulse Rate: 74 Nausea and Vomiting: 2 Tactile Disturbances: none Tremor: no tremor Auditory Disturbances: not present Paroxysmal Sweats: no sweat visible Visual Disturbances: not present Anxiety: no anxiety, at ease Headache, Fullness in Head: none present Agitation: normal activity Orientation and Clouding of Sensorium: oriented and can do serial additions CIWA-Ar Total: 2 COWS:    Allergies: No Known Allergies  Home Medications: (Not in a hospital admission)   OB/GYN Status:  No LMP for male patient.  General  Assessment Data Is this a Tele or Face-to-Face Assessment?: Tele Assessment Is this an Initial Assessment or a Re-assessment for this encounter?: Initial Assessment Patient Accompanied by:: (drove self to the Emergency Department ) Language Other than English: No Living Arrangements: (Live with girlfriend ) What gender do you identify as?: Male Marital status: Single Maiden name: (n/a) Pregnancy Status: No Living Arrangements: Spouse/significant other Can pt return to current living arrangement?: ("I don't know") Admission Status: Voluntary Is patient capable of signing voluntary admission?: Yes Referral Source: Self/Family/Friend Insurance type: (Medicaid )     Crisis Care Plan Living Arrangements: Spouse/significant other Legal Guardian: (no legal guardian ) Name of Psychiatrist: ("I forgot her name she is at [facility]") Name of Therapist: ("I don't know.Marland KitchenMarland KitchenI think she is at [facility]")  Education Status Is patient currently in school?: No Is the patient employed, unemployed or receiving disability?: Receiving disability income  Risk to self with the past 6 months Suicidal Ideation: Yes-Currently Present(started last night ) Has patient been a risk to self within the past 6 months prior to admission? : Yes Suicidal Intent: Yes-Currently Present Has patient had any suicidal intent within the past 6 months prior to admission? : Yes Is patient at risk for suicide?: Yes Suicidal Plan?: Yes-Currently Present Has patient had any suicidal plan within the past 6 months prior to admission? : No Specify Current  Suicidal Plan: (patient denies ) Access to Means: No Specify Access to Suicidal Means: (no access to firearms or weapons ) What has been your use of drugs/alcohol within the last 12 months?: (alcohol and THC) Previous Attempts/Gestures: Yes How many times?: (4x's-"I don't remember, my record is so long") Other Self Harm Risks: (patient denies) Triggers for Past  Attempts: (depression ) Intentional Self Injurious Behavior: None Family Suicide History: Yes("I can't get into that") Recent stressful life event(s): Other (Comment)("everyday stress"; pt did provide any examples ) Persecutory voices/beliefs?: No Depression: Yes Depression Symptoms: Feeling angry/irritable, Feeling worthless/self pity, Loss of interest in usual pleasures, Guilt, Fatigue, Isolating, Tearfulness, Insomnia, Despondent Substance abuse history and/or treatment for substance abuse?: No Suicide prevention information given to non-admitted patients: Not applicable  Risk to Others within the past 6 months Homicidal Ideation: No Does patient have any lifetime risk of violence toward others beyond the six months prior to admission? : No Thoughts of Harm to Others: No Current Homicidal Intent: No Current Homicidal Plan: No Access to Homicidal Means: No Identified Victim: (n/a) History of harm to others?: No Assessment of Violence: None Noted Violent Behavior Description: (currently calm and cooperative ) Does patient have access to weapons?: No Criminal Charges Pending?: No Does patient have a court date: No Is patient on probation?: No  Psychosis Hallucinations: Auditory("I can't explain, it's racing thoughts that are voices") Delusions: None noted  Mental Status Report Appearance/Hygiene: In scrubs Eye Contact: Good Motor Activity: Freedom of movement Speech: Logical/coherent Level of Consciousness: Alert Mood: Depressed Affect: Appropriate to circumstance Anxiety Level: None Thought Processes: Relevant, Coherent Judgement: Impaired Orientation: Person, Place, Time, Situation Obsessive Compulsive Thoughts/Behaviors: None  Cognitive Functioning Concentration: Normal Memory: Recent Intact, Remote Intact Is patient IDD: No Insight: Fair Impulse Control: Fair Appetite: Poor Have you had any weight changes? : Loss Amount of the weight change? (lbs): (5 pounds in  6 weeks ) Sleep: Decreased Total Hours of Sleep: (1-2 hrs per night ) Vegetative Symptoms: None  ADLScreening [facility] Assessment Services) Patient's cognitive ability adequate to safely complete daily activities?: Yes Patient able to express need for assistance with ADLs?: Yes(My fiance helps me get dressed because I have weakness in both legs) Independently performs ADLs?: Yes (appropriate for developmental age)  Prior Inpatient Therapy Prior Inpatient Therapy: Yes Prior Therapy Dates: ("I don't remember") Prior Therapy Facilty/Provider(s): ("I don't remember") Reason for Treatment: ("My mental health issues")  Prior Outpatient Therapy Prior Outpatient Therapy: Yes Prior Therapy Dates: (current) Prior Therapy Facilty/Provider(s): [facility].Marland KitchenMarland KitchenI don't remember name of providers) Reason for Treatment: (PTSD-states that he has not been compliant with medications ) Does patient have an ACCT team?: No Does patient have Intensive In-House Services?  : No Does patient have [facility] services? : No Does patient have P4CC services?: No  ADL Screening (condition at time of admission) Patient's cognitive ability adequate to safely complete daily activities?: Yes Is the patient deaf or have difficulty hearing?: No Does the patient have difficulty seeing, even when wearing glasses/contacts?: No Does the patient have difficulty concentrating, remembering, or making decisions?: Yes(Patient answered "I don't know" to alot of questinons. Also, "I can't rmember") Patient able to express need for assistance with ADLs?: Yes(My fiance helps me get dressed because I have weakness in both legs) Does the patient have difficulty dressing or bathing?: No Independently performs ADLs?: Yes (appropriate for developmental age) Does the patient have difficulty walking or climbing stairs?: No Weakness of Legs: Both("I have weakness in both legs") Weakness of  Arms/Hands: None  Home Assistive  Devices/Equipment Home Assistive Devices/Equipment: Cane (specify quad or straight)(He has a cane but states it's in his car.)    Abuse/Neglect Assessment (Assessment to be complete while patient is alone) Abuse/Neglect Assessment Can Be Completed: Yes Physical Abuse: Denies Verbal Abuse: Denies Sexual Abuse: Denies Exploitation of patient/patient's resources: Denies Self-Neglect: Denies     Advance Directives (For Healthcare) Does Patient Have a Medical Advance Directive?: No          Disposition: Patient meets criteria for inpatient treatment per Marciano Sequin, NP. No [facility] beds available. Pending placement.  Disposition Initial Assessment Completed for this Encounter: Yes  On Site Evaluation by:   Reviewed with Physician:    Melynda Ripple 08/05/2019 12:15 PM

## 2019-08-05 NOTE — BH Assessment (Signed)
Patient meets criteria for inpatient criteria, per Marciano Sequin. No beds at [facility]. Pending placement at another facility.

## 2019-08-06 ENCOUNTER — Other Ambulatory Visit: Payer: Self-pay

## 2019-08-06 MED ORDER — NICOTINE 21 MG/24HR TD PT24
21.0000 mg | MEDICATED_PATCH | Freq: Every day | TRANSDERMAL | Status: DC
  Administered 2019-08-06 – 2019-08-07 (×2): 21 mg via TRANSDERMAL
  Filled 2019-08-06 (×2): qty 1

## 2019-08-06 MED ORDER — NAPROXEN 250 MG PO TABS
500.0000 mg | ORAL_TABLET | Freq: Every day | ORAL | Status: DC
  Administered 2019-08-07: 500 mg via ORAL
  Filled 2019-08-06: qty 2

## 2019-08-06 MED ORDER — HYDROXYZINE HCL 25 MG PO TABS
25.0000 mg | ORAL_TABLET | Freq: Every day | ORAL | Status: DC
  Administered 2019-08-06 – 2019-08-07 (×2): 25 mg via ORAL
  Filled 2019-08-06 (×2): qty 1

## 2019-08-06 MED ORDER — MELOXICAM 7.5 MG PO TABS
7.5000 mg | ORAL_TABLET | Freq: Every day | ORAL | Status: DC
  Administered 2019-08-06 – 2019-08-07 (×2): 7.5 mg via ORAL
  Filled 2019-08-06 (×2): qty 1

## 2019-08-06 MED ORDER — PHENYTOIN SODIUM EXTENDED 100 MG PO CAPS
100.0000 mg | ORAL_CAPSULE | Freq: Every day | ORAL | Status: DC
  Administered 2019-08-06 – 2019-08-07 (×2): 100 mg via ORAL
  Filled 2019-08-06 (×2): qty 1

## 2019-08-06 NOTE — [facility] Counselor (Signed)
TTS reassessment: Patient is alert and oriented x 4. He is dressed in scrubs. His mood is depressed and his affect is flat. Patient denies current SI/HI. Patient reports AH but "none right at this moment." Patient states "I want to know what is going on in my head." This counselor informed patient we are currently seeking a bed in an inpatient facility. Patient demonstrated understanding.  Patient continues to be recommended for in patient treatment.

## 2019-08-06 NOTE — ED Notes (Signed)
Breakfast Ordered 

## 2019-08-06 NOTE — ED Notes (Signed)
Pt now awake and talking on phone at nurses' desk.

## 2019-08-06 NOTE — ED Notes (Signed)
Pt on 2nd phone call - talking w/Brenda.

## 2019-08-06 NOTE — ED Notes (Signed)
Rest of pt's home meds ordered by Dr Rush Landmark.

## 2019-08-06 NOTE — ED Notes (Signed)
Patient denies pain and is resting comfortably.  

## 2019-08-06 NOTE — ED Notes (Signed)
Pt calling [name] to request that she or whomever is coming to pick up his keys - Located in [place] [address] - call prior to coming so keys may be obtained. Advised may pick up keys today or tomorrow (08/07/19).

## 2019-08-06 NOTE — ED Notes (Signed)
Pt's friend, Janan Ridge - [phone] - called and requested for pt to call him. Will advise pt when he wakes.

## 2019-08-06 NOTE — ED Notes (Signed)
Re-TTS being performed.  

## 2019-08-07 MED ORDER — PHENYTOIN SODIUM EXTENDED 100 MG PO CAPS: 100.0000 mg | ORAL_CAPSULE | Freq: Every day | ORAL | 3 refills | Status: DC

## 2019-08-07 MED ORDER — LISINOPRIL 40 MG PO TABS: 40.0000 mg | ORAL_TABLET | Freq: Every day | ORAL | 1 refills | Status: DC

## 2019-08-07 MED ORDER — HYDROCHLOROTHIAZIDE 25 MG PO TABS: 25.0000 mg | ORAL_TABLET | Freq: Every day | ORAL | 3 refills | Status: DC

## 2019-08-07 MED ORDER — HYDROXYZINE HCL 25 MG PO TABS: 25.0000 mg | ORAL_TABLET | Freq: Every day | ORAL | 1 refills | Status: DC

## 2019-08-07 MED ORDER — GABAPENTIN 400 MG PO CAPS: 400.0000 mg | ORAL_CAPSULE | Freq: Three times a day (TID) | ORAL | 1 refills | Status: AC

## 2019-08-07 MED ORDER — HYDROCHLOROTHIAZIDE 25 MG PO TABS
25.0000 mg | ORAL_TABLET | Freq: Once | ORAL | Status: AC
  Administered 2019-08-07: 25 mg via ORAL
  Filled 2019-08-07: qty 1

## 2019-08-07 MED ORDER — QUETIAPINE FUMARATE 400 MG PO TABS: 400.0000 mg | ORAL_TABLET | Freq: Every day | ORAL | 3 refills | Status: DC

## 2019-08-07 NOTE — Progress Notes (Signed)
CSW provided Pt with substance use rehab list and informed Pt which facilities CSW has contacted.  CSW encouraged Pt to follow up with facilities tomorrow since bed availability changes frequently.

## 2019-08-07 NOTE — ED Notes (Signed)
Pt called his fiance to let her know he is up for D/C & she was then put on the phone with the social worker to be fully explained what all was going on & that the pt had been psych cleared.  Pt has been given his belongings back, he is in the bathroom getting dressed while his ride is on the way here to get him.

## 2019-08-07 NOTE — Social Work (Addendum)
CSW spoke with [facility] admissions and they do not currently have any bed availability for rehab.  CSW also spoke with [facility] residential and they also have no bed availability currently.   CSW spoke with Pt who states that he does not wish to go to any mission options and wishes to speak with his girlfriend to arrange to go to [place] to stay with his sister.

## 2019-08-07 NOTE — Discharge Instructions (Signed)
Be sure to take your medications, as prescribed. Follow-up with one of the mental health professionals included in the resources given. Return to the emergency department or call 911 should you begin to have thoughts of suicide, self-harm, or harming others.

## 2019-08-07 NOTE — ED Provider Notes (Signed)
Psych has cleared the patient.   I spoke with the patient and he seems to have good insight into the next steps for his treatment.  He states he has run out of his medications, therefore, I will look into options for getting these refilled. [facility] He [facility] for [facility].  He denies SI/HI.  Physical Exam Vitals and nursing note reviewed.  Constitutional:      General: He is not in acute distress.    Appearance: He is well-developed. He is not diaphoretic.  HENT:     Head: Normocephalic and atraumatic.  Eyes:     Conjunctiva/sclera: Conjunctivae normal.  Cardiovascular:     Rate and Rhythm: Normal rate and regular rhythm.  Pulmonary:     Effort: Pulmonary effort is normal.  Musculoskeletal:     Cervical back: Neck supple.  Skin:    General: Skin is warm and dry.     Coloration: Skin is not pale.  Neurological:     Mental Status: He is alert and oriented to person, place, and time.     Comments: No noted tremor.  Psychiatric:        Mood and Affect: Mood normal.        Behavior: Behavior normal.     Comments: Interactive.  Normal conversational tempo.  Good eye contact.      Vitals:   08/06/19 1750 08/06/19 2244 08/07/19 1024 08/07/19 1745  BP: (!) 144/95 (!) 176/90 138/77 (!) 185/11  Pulse: 61 70 63 65  Resp:   18 18  Temp: 98.3 F (36.8 C) 98.1 F (36.7 C) 98.4 F (36.9 C) 98.5 F (36.9 C)  TempSrc: Oral Oral Oral Oral  SpO2: 97% 95% 100% 100%  Weight:      Height:          Anselm Pancoast, PA-C 08/07/19 1751    Virgina Norfolk, DO 08/07/19 2010

## 2019-08-07 NOTE — ED Notes (Signed)
Breakfast ordered 

## 2019-08-07 NOTE — ED Notes (Addendum)
Pt is being cooperative, denies SI, he has used one phone call so far this morning. Pt is waiting to begin TTE for re-eval at this time. Sitter remains at bedside at this time.

## 2019-08-07 NOTE — ED Notes (Signed)
Pt had a visitor bring in a suitcase of belongings for when he leaves & heads towards the [place] (per pt). This belongings bag is in his locker & was added onto the inventory sheet. Pt gave this visitor his car keys (this RN retrieved from his locker) so that his car can be moved from where it is currently parked.

## 2019-08-07 NOTE — Consult Note (Signed)
Telepsych Consultation   Reason for Consult:  Psych Consult Referring Physician:  EDP Location of Patient[facility] Location of Provider: Behavioral Health TTS Department  Patient Identification: Adrian Alexander MRN:  [id number] Principal Diagnosis: Substance induced mood disorder (HCC) Diagnosis:  Principal Problem:   Substance induced mood disorder (HCC)   Total Time spent with patient: 30 minutes  Subjective:   Adrian Alexander is a 59 y.o. male patient admitted with chronic suicidal ideations. " I been suicidal all my life. I stopped my meds for two weeks and this happened. " [facility] . He reports increased stressors related to "everything. Im an alcoholic. I was being a fool. " Patient is vague and does not appear forthcoming about his true reasons for coming into the hospital. He reports a history of depression, traumatic stress disorder, and non compliance with his medications. He continues to deny suicidal ideations and homicidal ideations. He has been present in the ED for 3 days, and has continuously refuted all suicidal ideations. 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   HPI:  [facility] He is a self referral. Patient drove his personal car to the hospital. [facility] suicidal ideations. Onset of suicidal thoughts started yesterday, 08/04/19. When asked about what may have triggered his suicidal thoughts or stressors he states, "Everything...stress". Patient would not elaborate or provide any further details. He has tried to harm himself in the past but doesn't recall when, how, or the trigger. He responds, "I don't know to all questions". However, he does report several prior attempts. Patient has a family history of mental health illness but did not want to discuss during the assessment. Patient does report the following depressive symptoms: Feeling angry/irritable, Feeling worthless/self  pity, Loss of interest in usual pleasures, Guilt, Fatigue, Isolating, Tearfulness, Insomnia, Despondent. Patient's anxiety level is reported as mild.  Patient denies HI. He denies history of assaultive and/or aggressive behaviors. Patient denies legal issues. Patient reports auditory hallucinations. He was asked to provide details and states, "I can't explain, it's racing thoughts that are voices". Patient denies visual hallucinations.  Past Psychiatric History: Depression, PTSD, alcohol use disorder Medications include Seroquel 400mg , Gabapentin 400mg  po TID  Last suicide attempt was 3 years ago. Last inpatient hospitalization was 3 years ago.   Risk to Self: Suicidal Ideation: Yes-Currently Present(started last night ) Suicidal Intent: Yes-Currently Present Is patient at risk for suicide?: Yes Suicidal Plan?: Yes-Currently Present Specify Current Suicidal Plan: (patient denies ) Access to Means: No Specify Access to Suicidal Means: (no access to firearms or weapons ) What has been your use of drugs/alcohol within the last 12 months?: (alcohol and THC) How many times?: (4x's-"I don't remember, my record is so long") Other Self Harm Risks: (patient denies) Triggers for Past Attempts: (depression ) Intentional Self Injurious Behavior: None Risk to Others: Homicidal Ideation: No Thoughts of Harm to Others: No Current Homicidal Intent: No Current Homicidal Plan: No Access to Homicidal Means: No Identified Victim: (n/a) History of harm to others?: No Assessment of Violence: None Noted Violent Behavior Description: (currently calm and cooperative ) Does patient have access to weapons?: No Criminal Charges Pending?: No Does patient have a court date: No Prior Inpatient Therapy: Prior Inpatient Therapy: Yes Prior Therapy Dates: ("I don't remember") Prior Therapy Facilty/Provider(s): ("I don't remember") Reason for Treatment: ("My mental health issues") Prior Outpatient Therapy: Prior  Outpatient Therapy: Yes Prior Therapy Dates: (current)  Prior Therapy Facilty[facility]Provider([facility]): [facility]Marland KitchenMarland KitchenI don't remember name of providers) Reason for Treatment: (PTSD-states that he has not been compliant with medications ) Does patient have an ACCT team?: No Does patient have Intensive In-House Services?  : No Does patient have [facility] services? : No Does patient have P4CC services?: No  Past Medical History:  Past Medical History:  Diagnosis Date  . Anxiety   . Bipolar affective disorder, depressed (HCC)   . Hypertension   . Schizo-affective schizophrenia (HCC)   . Schizophrenia, schizo-affective (HCC)   . Seizure (HCC)   . Seizures (HCC)    Last month. gets a sensation    Past Surgical History:  Procedure Laterality Date  . ANKLE SURGERY    . CLOSED REDUCTION WITH HUMER PIN INSERTION     left ankle 1998  . HERNIA REPAIR     while in prison  . HERNIA REPAIR     Family History:  Family History  Problem Relation Age of Onset  . Cancer Mother 44       colon  . Heart disease Father   . Heart attack Father 60   Family Psychiatric  History:patient denies Social History:  Social History   Substance and Sexual Activity  Alcohol Use Yes  . Alcohol/week: 42.0 standard drinks  . Types: 42 Cans of beer per week     Social History   Substance and Sexual Activity  Drug Use Yes  . Types: Marijuana, Cocaine    Social History   Socioeconomic History  . Marital status: Divorced    Spouse name: Not on file  . Number of children: Not on file  . Years of education: Not on file  . Highest education level: Not on file  Occupational History  . Not on file  Tobacco Use  . Smoking status: Former Smoker    Packs/day: 0.10    Years: 20.00    Pack years: 2.00    Types: Cigarettes  . Smokeless tobacco: Current User    Types: Snuff  Substance and Sexual Activity  . Alcohol use: Yes    Alcohol/week: 42.0 standard drinks    Types: 42 Cans of beer per week  .  Drug use: Yes    Types: Marijuana, Cocaine  . Sexual activity: Yes    Birth control/protection: Condom  Other Topics Concern  . Not on file  Social History Narrative   Lives in [place] alone   Social Determinants of Health   Financial Resource Strain:   . Difficulty of Paying Living Expenses:   Food Insecurity:   . Worried About Programme researcher, broadcasting/film/video in the Last Year:   . Barista in the Last Year:   Transportation Needs:   . Freight forwarder (Medical):   Marland Kitchen Lack of Transportation (Non-Medical):   Physical Activity:   . Days of Exercise per Week:   . Minutes of Exercise per Session:   Stress:   . Feeling of Stress :   Social Connections:   . Frequency of Communication with Friends and Family:   . Frequency of Social Gatherings with Friends and Family:   . Attends Religious Services:   . Active Member of Clubs or Organizations:   . Attends Banker Meetings:   Marland Kitchen Marital Status:    Additional Social History:     Allergies:  No Known Allergies  Labs: No results found for this or any previous visit (from the past 48 hour(s)).  Medications:  Current Facility-Administered  Medications  Medication Dose Route Frequency Provider Last Rate Last Admin  . acetaminophen (TYLENOL) tablet 650 mg  650 mg Oral Q4H PRN Arby Barrette, MD   650 mg at 08/06/19 1225  . alum & mag hydroxide-simeth (MAALOX/MYLANTA) 200-200-20 MG/5ML suspension 30 mL  30 mL Oral Q6H PRN Arby Barrette, MD      . gabapentin (NEURONTIN) capsule 400 mg  400 mg Oral TID Arby Barrette, MD   400 mg at 08/07/19 1021  . hydrOXYzine (ATARAX/VISTARIL) tablet 25 mg  25 mg Oral Daily Tegeler, Canary Brim, MD   25 mg at 08/07/19 1021  . lisinopril (ZESTRIL) tablet 40 mg  40 mg Oral Daily Arby Barrette, MD   40 mg at 08/07/19 1021  . LORazepam (ATIVAN) injection 0-4 mg  0-4 mg Intravenous Q12H Arby Barrette, MD       Or  . LORazepam (ATIVAN) tablet 0-4 mg  0-4 mg Oral Q12H Pfeiffer,  Marcy, MD      . meloxicam (MOBIC) tablet 7.5 mg  7.5 mg Oral Daily Tegeler, Canary Brim, MD   7.5 mg at 08/07/19 1023  . multivitamin with minerals tablet 1 tablet  1 tablet Oral Daily Arby Barrette, MD   1 tablet at 08/07/19 1020  . naproxen (NAPROSYN) tablet 500 mg  500 mg Oral Daily Tegeler, Canary Brim, MD   500 mg at 08/07/19 1021  . nicotine (NICODERM CQ - dosed in mg/24 hours) patch 21 mg  21 mg Transdermal Daily Tilden Fossa, MD   21 mg at 08/07/19 1023  . ondansetron (ZOFRAN) tablet 4 mg  4 mg Oral Q8H PRN Arby Barrette, MD      . pantoprazole (PROTONIX) EC tablet 80 mg  80 mg Oral BID Arby Barrette, MD   80 mg at 08/07/19 1022  . phenytoin (DILANTIN) ER capsule 100 mg  100 mg Oral Daily Tegeler, Canary Brim, MD   100 mg at 08/07/19 1020  . QUEtiapine (SEROQUEL) tablet 400 mg  400 mg Oral QHS Arby Barrette, MD   400 mg at 08/06/19 2254  . thiamine tablet 100 mg  100 mg Oral Daily Arby Barrette, MD   100 mg at 08/07/19 1022   Or  . thiamine (B-1) injection 100 mg  100 mg Intravenous Daily Arby Barrette, MD       Current Outpatient Medications  Medication Sig Dispense Refill  . amLODipine (NORVASC) 10 MG tablet Take 10 mg by mouth daily.    . cloNIDine (CATAPRES) 0.3 MG tablet Take 1 tablet (0.3 mg total) by mouth 2 (two) times daily. (Patient taking differently: Take 0.3 mg by mouth 3 (three) times daily. ) 180 tablet 3  . cyclobenzaprine (FLEXERIL) 5 MG tablet Take 5 mg by mouth 3 (three) times daily.  0  . diazepam (VALIUM) 5 MG tablet Take 5 mg by mouth 2 (two) times daily as needed for anxiety.     . diclofenac Sodium (VOLTAREN) 1 % GEL Apply 2 g topically 4 (four) times daily. 150 g 0  . docusate sodium (COLACE) 100 MG capsule Take 100 mg by mouth daily as needed for mild constipation.     . gabapentin (NEURONTIN) 400 MG capsule TAKE ONE CAPSULE BY MOUTH THREE TIMES DAILY (Patient taking differently: Take 400 mg by mouth 3 (three) times daily. ) 90 capsule 3   . hydrochlorothiazide (HYDRODIURIL) 25 MG tablet Take 25 mg by mouth daily.  3  . hydrOXYzine (ATARAX/VISTARIL) 25 MG tablet Take 25 mg by mouth daily.     Marland Kitchen  lisinopril (PRINIVIL,ZESTRIL) 40 MG tablet Take 40 mg by mouth daily.  3  . meloxicam (MOBIC) 7.5 MG tablet Take 7.5 mg by mouth daily.     . Multiple Vitamin (MULTIVITAMIN WITH MINERALS) TABS tablet Take 1 tablet by mouth daily. 30 tablet 0  . naproxen (NAPROSYN) 500 MG tablet Take 500 mg by mouth daily.     . pantoprazole (PROTONIX) 40 MG tablet Take 2 tablets (80 mg total) by mouth 2 (two) times daily. (Patient taking differently: Take 40 mg by mouth daily. ) 60 tablet 0  . PARoxetine (PAXIL) 20 MG tablet Take 20 mg by mouth daily.    . phenytoin (DILANTIN) 100 MG ER capsule Take 100 mg by mouth daily.  3  . potassium chloride (MICRO-K) 10 MEQ CR capsule Take 10 mEq by mouth daily.     . prazosin (MINIPRESS) 1 MG capsule Take 1 mg by mouth at bedtime.     Marland Kitchen QUEtiapine (SEROQUEL) 400 MG tablet Take 400 mg by mouth at bedtime.  3  . terbinafine (LAMISIL) 250 MG tablet Take 250 mg by mouth daily.     . traMADol (ULTRAM) 50 MG tablet Take 1 tablet (50 mg total) by mouth every 6 (six) hours as needed. (Patient taking differently: Take 50 mg by mouth every 6 (six) hours as needed for moderate pain. ) 10 tablet 0  . cholecalciferol (VITAMIN D) 1000 UNITS tablet Take 2 tablets (2,000 Units total) by mouth daily. (Patient not taking: Reported on 03/29/2017) 30 tablet 0    Musculoskeletal: Strength & Muscle Tone: within normal limits Gait & Station: normal Patient leans: N/A  Psychiatric Specialty Exam: Physical Exam  Review of Systems  Blood pressure 138/77, pulse 63, temperature 98.4 F (36.9 C), temperature source Oral, resp. rate 18, height 6\' 1"  (1.854 m), weight 90.7 kg, SpO2 100 %.Body mass index is 26.39 kg/m.  General Appearance: Fairly Groomed  Eye Contact:  Fair  Speech:  Clear and Coherent and Normal Rate  Volume:  Normal   Mood:  Irritable  Affect:  Congruent and Labile  Thought Process:  Coherent, Linear and Descriptions of Associations: Intact  Orientation:  Full (Time, Place, and Person)  Thought Content:  Tangential  Suicidal Thoughts:  No  Homicidal Thoughts:  No  Memory:  Immediate;   Fair Recent;   Fair  Judgement:  Other:  improved  Insight:  Fair  Psychomotor Activity:  Normal  Concentration:  Concentration: Fair and Attention Span: Fair  Recall:  [employer] of Knowledge:  Fair  Language:  Fair  Akathisia:  Negative  Handed:  Right  AIMS (if indicated):     Assets:  Communication Skills Desire for Improvement Financial Resources/Insurance Physical Health Social Support Transportation  ADL's:  Intact  Cognition:  WNL  Sleep:        Treatment Plan Summary: Plan Will psych clear at this time. Patient denies suicidal ideation, homicidal ideation, and or hallucinations. He has resumed his medications since coming into the ED, and symptoms have improved. Patient would like help with substance abuse treatment. Will place SW and peer support to assist with resources to include [employer], [employer], and [facility].   Disposition: No evidence of imminent risk to self or others at present.   Patient does not meet criteria for psychiatric inpatient admission. Supportive therapy provided about ongoing stressors. Discussed crisis plan, support from social network, calling 911, coming to the Emergency Department, and calling Suicide Hotline.  This service was provided via telemedicine using  a 2-way, interactive audio and Immunologist.  Names of all persons participating in this telemedicine service and their role in this encounter. Name: Caryn Bee Role: PMHNP-BC, FNP-BC  Name: Adrian Deputy Role: Patient    Maryagnes Amos, FNP 08/07/2019 1:45 PM

## 2019-12-05 ENCOUNTER — Other Ambulatory Visit: Payer: Self-pay

## 2019-12-05 ENCOUNTER — Emergency Department (HOSPITAL_COMMUNITY): Payer: Medicaid Other

## 2019-12-05 ENCOUNTER — Encounter (HOSPITAL_COMMUNITY): Payer: Self-pay | Admitting: Emergency Medicine

## 2019-12-05 ENCOUNTER — Emergency Department (HOSPITAL_COMMUNITY)
Admission: EM | Admit: 2019-12-05 | Discharge: 2019-12-05 | Disposition: A | Discharge status: Home / Self Care | Payer: Medicaid Other | Attending: Emergency Medicine | Admitting: Emergency Medicine

## 2019-12-05 DIAGNOSIS — Z20822 Contact with and (suspected) exposure to covid-19: Secondary | ICD-10-CM | POA: Diagnosis not present

## 2019-12-05 DIAGNOSIS — Z87891 Personal history of nicotine dependence: Secondary | ICD-10-CM | POA: Diagnosis not present

## 2019-12-05 DIAGNOSIS — I10 Essential (primary) hypertension: Secondary | ICD-10-CM | POA: Diagnosis not present

## 2019-12-05 DIAGNOSIS — Z79899 Other long term (current) drug therapy: Secondary | ICD-10-CM | POA: Diagnosis not present

## 2019-12-05 DIAGNOSIS — R0981 Nasal congestion: Secondary | ICD-10-CM | POA: Diagnosis not present

## 2019-12-05 DIAGNOSIS — R531 Weakness: Secondary | ICD-10-CM | POA: Insufficient documentation

## 2019-12-05 DIAGNOSIS — R05 Cough: Secondary | ICD-10-CM | POA: Diagnosis present

## 2019-12-05 DIAGNOSIS — R0602 Shortness of breath: Secondary | ICD-10-CM | POA: Diagnosis not present

## 2019-12-05 LAB — BASIC METABOLIC PANEL
Anion gap: 9 (ref 5–15)
BUN: 16 mg/dL (ref 6–20)
CO2: 25 mmol/L (ref 22–32)
Calcium: 8.9 mg/dL (ref 8.9–10.3)
Chloride: 105 mmol/L (ref 98–111)
Creatinine, Ser: 1.08 mg/dL (ref 0.61–1.24)
GFR calc Af Amer: >60 mL/min (ref >60)
GFR calc non Af Amer: >60 mL/min (ref >60)
Glucose, Bld: 97 mg/dL (ref 70–99)
Potassium: 3.8 mmol/L (ref 3.5–5.1)
Sodium: 139 mmol/L (ref 135–145)

## 2019-12-05 LAB — URINALYSIS, ROUTINE W REFLEX MICROSCOPIC
Bilirubin Urine: NEGATIVE
Glucose, UA: NEGATIVE mg/dL
Hgb urine dipstick: NEGATIVE
Ketones, ur: NEGATIVE mg/dL
Leukocytes,Ua: NEGATIVE
Nitrite: NEGATIVE
Protein, ur: NEGATIVE mg/dL
Specific Gravity, Urine: 1.026 (ref 1.005–1.030)
pH: 6 (ref 5.0–8.0)

## 2019-12-05 LAB — CBC
HCT: 46.6 % (ref 39.0–52.0)
Hemoglobin: 15.1 g/dL (ref 13.0–17.0)
MCH: 25.8 pg — ABNORMAL LOW (ref 26.0–34.0)
MCHC: 32.4 g/dL (ref 30.0–36.0)
MCV: 79.5 fL — ABNORMAL LOW (ref 80.0–100.0)
Platelets: 265 10*3/uL (ref 150–400)
RBC: 5.86 MIL/uL — ABNORMAL HIGH (ref 4.22–5.81)
RDW: 13.3 % (ref 11.5–15.5)
WBC: 4.5 10*3/uL (ref 4.0–10.5)
nRBC: 0 % (ref 0.0–0.2)

## 2019-12-05 LAB — SARS CORONAVIRUS 2 BY RT PCR (HOSPITAL ORDER, PERFORMED IN ~~LOC~~ HOSPITAL LAB): SARS Coronavirus 2: NEGATIVE

## 2019-12-05 MED ORDER — GUAIFENESIN 100 MG/5ML PO LIQD: 100.0000 mg | ORAL | 0 refills | Status: DC | PRN

## 2019-12-05 MED ORDER — ACETAMINOPHEN 500 MG PO TABS
1000.0000 mg | ORAL_TABLET | Freq: Once | ORAL | Status: AC
  Administered 2019-12-05: 1000 mg via ORAL
  Filled 2019-12-05: qty 2

## 2019-12-05 NOTE — Discharge Instructions (Signed)
You have been evaluated for your symptoms.  Fortunately your COVID-19 test is negative.  You do not have Covid.  Your labs are reassuring.  Take guaifenesin as needed for your congestion.  Follow-up with your doctor for further care.

## 2019-12-05 NOTE — ED Provider Notes (Signed)
[facility] EMERGENCY DEPARTMENT Provider Note   CSN: [id number] Arrival date & time: 12/05/19  0160     History Chief Complaint  Patient presents with  . COVID testing  . Weakness  . Shortness of Breath  . Cough    Adrian Alexander is a 59 y.o. male.  The history is provided by the patient. No language interpreter was used.  Weakness Associated symptoms: cough and shortness of breath   Shortness of Breath Associated symptoms: cough   Cough Associated symptoms: shortness of breath      59 year old male with significant history of bipolar, anxiety, schizophrenia, hypertension, seizures, presenting with concern of Covid infection.  Patient report yesterday Adrian Alexander developed some mild sinus congestion, throat irritation, occasional nonproductive cough.  His wife was concerned because his son recently went to the get tested for Covid and she would like for him to be tested for Covid as well.  Patient states Adrian Alexander has had Covid vaccines in June.  Today Adrian Alexander report feeling better than yesterday without any specific treatment.  Adrian Alexander denies having fever or chills Adrian Alexander denies having loss of taste or smell denies any chest pain shortness of breath abdominal pain.  No environmental changes.  Adrian Alexander does report occasional allergies when mowing the grass and will take Benadryl as needed.  Adrian Alexander last mows grass 5 days ago.  Past Medical History:  Diagnosis Date  . Anxiety   . Bipolar affective disorder, depressed (HCC)   . Hypertension   . Schizo-affective schizophrenia (HCC)   . Schizophrenia, schizo-affective (HCC)   . Seizure (HCC)   . Seizures (HCC)    Last month. gets a sensation    Patient Active Problem List   Diagnosis Date Noted  . Hepatitis C antibody test positive 05/21/2019  . Chest pain 03/29/2017  . Gastritis 03/29/2017  . Hematochezia 03/29/2017  . Hematemesis 03/29/2017  . Alcohol dependence with withdrawal, uncomplicated (HCC) 01/25/2015  . Post traumatic stress  disorder (PTSD) 01/25/2015  . Schizoaffective disorder (HCC) 01/25/2015  . Cocaine abuse with cocaine-induced mood disorder (HCC) 01/25/2015  . MDD (major depressive disorder) 01/24/2015  . Substance induced mood disorder (HCC) 09/10/2011    Past Surgical History:  Procedure Laterality Date  . ANKLE SURGERY    . CLOSED REDUCTION WITH HUMER PIN INSERTION     left ankle 1998  . HERNIA REPAIR     while in prison  . HERNIA REPAIR         Family History  Problem Relation Age of Onset  . Cancer Mother 71       colon  . Heart disease Father   . Heart attack Father 44    Social History   Tobacco Use  . Smoking status: Former Smoker    Packs/day: 0.10    Years: 20.00    Pack years: 2.00    Types: Cigarettes  . Smokeless tobacco: Current User    Types: Snuff  Substance Use Topics  . Alcohol use: Yes    Alcohol/week: 42.0 standard drinks    Types: 42 Cans of beer per week  . Drug use: Yes    Types: Marijuana, Cocaine    Home Medications Prior to Admission medications   Medication Sig Start Date End Date Taking? Authorizing Provider  amLODipine (NORVASC) 10 MG tablet Take 10 mg by mouth daily. 07/30/19   [provider]  cholecalciferol (VITAMIN D) 1000 UNITS tablet Take 2 tablets (2,000 Units total) by mouth daily. Patient not taking: Reported on  03/29/2017 01/27/15   Oneta RackLewis, Tanika N, NP  cloNIDine (CATAPRES) 0.3 MG tablet Take 1 tablet (0.3 mg total) by mouth 2 (two) times daily. Patient taking differently: Take 0.3 mg by mouth 3 (three) times daily.  02/07/15   Henrietta HooverBernhardt, Linda C, NP  cyclobenzaprine (FLEXERIL) 5 MG tablet Take 5 mg by mouth 3 (three) times daily. 03/14/17   [provider]  diazepam (VALIUM) 5 MG tablet Take 5 mg by mouth 2 (two) times daily as needed for anxiety.  04/30/19   [provider]  diclofenac Sodium (VOLTAREN) 1 % GEL Apply 2 g topically 4 (four) times daily. 04/09/19   Gailen ShelterFondaw, Wylder S, PA  docusate sodium (COLACE)  100 MG capsule Take 100 mg by mouth daily as needed for mild constipation.     [provider]  gabapentin (NEURONTIN) 400 MG capsule Take 1 capsule (400 mg total) by mouth 3 (three) times daily. 08/07/19 10/06/19  Joy, Shawn C, PA-C  hydrochlorothiazide (HYDRODIURIL) 25 MG tablet Take 1 tablet (25 mg total) by mouth daily. 08/07/19   Joy, Shawn C, PA-C  hydrOXYzine (ATARAX/VISTARIL) 25 MG tablet Take 1 tablet (25 mg total) by mouth daily. 08/07/19   Joy, Shawn C, PA-C  lisinopril (ZESTRIL) 40 MG tablet Take 1 tablet (40 mg total) by mouth daily. 08/07/19 10/06/19  Joy, Shawn C, PA-C  meloxicam (MOBIC) 7.5 MG tablet Take 7.5 mg by mouth daily.  11/07/18   [provider]  Multiple Vitamin (MULTIVITAMIN WITH MINERALS) TABS tablet Take 1 tablet by mouth daily. 01/27/15   Oneta RackLewis, Tanika N, NP  naproxen (NAPROSYN) 500 MG tablet Take 500 mg by mouth daily.     [provider]  pantoprazole (PROTONIX) 40 MG tablet Take 2 tablets (80 mg total) by mouth 2 (two) times daily. Patient taking differently: Take 40 mg by mouth daily.  03/30/17   Levora DredgeHelberg, Justin, MD  PARoxetine (PAXIL) 20 MG tablet Take 20 mg by mouth daily. 06/05/19   [provider]  phenytoin (DILANTIN) 100 MG ER capsule Take 1 capsule (100 mg total) by mouth daily. 08/07/19   Joy, Shawn C, PA-C  potassium chloride (MICRO-K) 10 MEQ CR capsule Take 10 mEq by mouth daily.     [provider]  prazosin (MINIPRESS) 1 MG capsule Take 1 mg by mouth at bedtime.  06/05/19   [provider]  QUEtiapine (SEROQUEL) 400 MG tablet Take 1 tablet (400 mg total) by mouth at bedtime. 08/07/19   Joy, Shawn C, PA-C  terbinafine (LAMISIL) 250 MG tablet Take 250 mg by mouth daily.     [provider]  traMADol (ULTRAM) 50 MG tablet Take 1 tablet (50 mg total) by mouth every 6 (six) hours as needed. Patient taking differently: Take 50 mg by mouth every 6 (six) hours as needed for moderate pain.  04/13/19   Gwyneth SproutPlunkett,  Whitney, MD    Allergies    Patient has no known allergies.  Review of Systems   Review of Systems  Respiratory: Positive for cough and shortness of breath.   Neurological: Positive for weakness.  All other systems reviewed and are negative.   Physical Exam Updated Vital Signs BP (!) 166/120 (BP Location: Right Arm)   Pulse 67   Temp 98.8 F (37.1 C) (Oral)   Resp 18   SpO2 100%   Physical Exam Vitals and nursing note reviewed.  Constitutional:      General: Adrian Alexander is not in acute distress.    Appearance: Adrian Alexander  is well-developed.  HENT:     Head: Atraumatic.  Eyes:     Conjunctiva/sclera: Conjunctivae normal.  Cardiovascular:     Rate and Rhythm: Normal rate and regular rhythm.  Pulmonary:     Effort: Pulmonary effort is normal.     Breath sounds: Normal breath sounds. No decreased breath sounds, wheezing, rhonchi or rales.  Chest:     Chest wall: No tenderness.  Abdominal:     Palpations: Abdomen is soft.     Tenderness: There is no abdominal tenderness.  Musculoskeletal:     Cervical back: Neck supple.  Skin:    Findings: No rash.  Neurological:     Mental Status: Adrian Alexander is alert.  Psychiatric:        Mood and Affect: Mood normal.     ED Results / Procedures / Treatments   Labs (all labs ordered are listed, but only abnormal results are displayed) Labs Reviewed  CBC - Abnormal; Notable for the following components:      Result Value   RBC 5.86 (*)    MCV 79.5 (*)    MCH 25.8 (*)    All other components within normal limits  SARS CORONAVIRUS 2 BY RT PCR (HOSPITAL ORDER, PERFORMED IN [facility])  BASIC METABOLIC PANEL  URINALYSIS, ROUTINE W REFLEX MICROSCOPIC    EKG None  ED ECG REPORT   Date: 12/05/2019  Rate: 57  Rhythm: sinus bradycardia  QRS Axis: left  Intervals: normal  ST/T Wave abnormalities: nonspecific T wave changes  Conduction Disutrbances:none  Narrative Interpretation:   Old EKG Reviewed: unchanged  I have personally  reviewed the EKG tracing and agree with the computerized printout as noted.   Radiology DG Chest Portable 1 View  Result Date: 12/05/2019 CLINICAL DATA:  Shortness of breath and cough for 1.5 days EXAM: PORTABLE CHEST 1 VIEW COMPARISON:  03/18/2018 FINDINGS: The heart size and mediastinal contours are within normal limits. Both lungs are clear. The visualized skeletal structures are unremarkable. IMPRESSION: No active disease. Electronically Signed   By: Signa Kell M.D.   On: 12/05/2019 09:44    Procedures Procedures (including critical care time)  Medications Ordered in ED Medications  acetaminophen (TYLENOL) tablet 1,000 mg (has no administration in time range)    ED Course  I have reviewed the triage vital signs and the nursing notes.  Pertinent labs & imaging results that were available during my care of the patient were reviewed by me and considered in my medical decision making (see chart for details).    MDM Rules/Calculators/A&P                          BP (!) 166/120 (BP Location: Right Arm)   Pulse 67   Temp 98.8 F (37.1 C) (Oral)   Resp 18   SpO2 100%   Final Clinical Impression(s) / ED Diagnoses Final diagnoses:  Sinus congestion    Rx / DC Orders ED Discharge Orders         Ordered    guaiFENesin (ROBITUSSIN) 100 MG/5ML liquid  Every 4 hours PRN        12/05/19 1150         Patient here with some sinus congestion and throat irritation yesterday with concerns of COVID-19.  Adrian Alexander has been fully vaccinated.COVID-19 test obtained here is negative for infection.  Adrian Alexander states Adrian Alexander is feeling fine at this time.  No report of any chest pain.  Labs  are reassuring.  I reassured patient .  Adrian Alexander is stable for discharge.   Cagney Degrace was evaluated in Emergency Department on 12/05/2019 for the symptoms described in the history of present illness. Adrian Alexander was evaluated in the context of the global COVID-19 pandemic, which necessitated consideration that the patient might be  at risk for infection with the SARS-CoV-2 virus that causes COVID-19. Institutional protocols and algorithms that pertain to the evaluation of patients at risk for COVID-19 are in a state of rapid change based on information released by regulatory bodies including the CDC and federal and state organizations. These policies and algorithms were followed during the patient's care in the ED.    Fayrene Helper, PA-C 12/05/19 1153    Margarita Grizzle, MD 12/08/19 1331

## 2019-12-05 NOTE — ED Triage Notes (Signed)
Pt reports SOB, non-productive cough, generalized weakness, body aches, loss of taste, and loss of smell since last night.  Received COVID vaccines.

## 2019-12-24 ENCOUNTER — Telehealth: Payer: Self-pay

## 2019-12-24 NOTE — Telephone Encounter (Signed)
Received referral from [facility] [phone] Referral sent to scheduling Records on File

## 2019-12-25 ENCOUNTER — Ambulatory Visit: Payer: Medicaid Other | Attending: Internal Medicine | Admitting: Physical Therapy

## 2019-12-25 ENCOUNTER — Other Ambulatory Visit: Payer: Self-pay

## 2019-12-25 VITALS — BP 149/76 | HR 70

## 2019-12-25 DIAGNOSIS — G8929 Other chronic pain: Secondary | ICD-10-CM | POA: Diagnosis present

## 2019-12-25 DIAGNOSIS — R2681 Unsteadiness on feet: Secondary | ICD-10-CM | POA: Diagnosis present

## 2019-12-25 DIAGNOSIS — R2689 Other abnormalities of gait and mobility: Secondary | ICD-10-CM | POA: Insufficient documentation

## 2019-12-25 DIAGNOSIS — M545 Low back pain, unspecified: Secondary | ICD-10-CM

## 2019-12-25 DIAGNOSIS — Z9181 History of falling: Secondary | ICD-10-CM | POA: Insufficient documentation

## 2019-12-25 DIAGNOSIS — M6281 Muscle weakness (generalized): Secondary | ICD-10-CM

## 2019-12-25 NOTE — Therapy (Addendum)
[facility] [address] [place], [place], [address] Phone: [phone]   Fax:  [phone]  Physical Therapy Evaluation  Patient Details  Name: Adrian Alexander MRN: [id number] Date of Birth: [birth year] Referring Provider (PT): [place], [place]   Encounter Date: 12/25/2019   PT End of Session - 12/25/19 1224    Visit Number 1    Number of Visits 16   1x week for 3 weeks, 2x week for 6 weeks   Authorization Type CCME - awaiting auth    PT Start Time 1015    PT Stop Time 1053    PT Time Calculation (min) 38 min    Equipment Utilized During Treatment Gait belt    Activity Tolerance Patient tolerated treatment well;Patient limited by pain   low back pain   Behavior During Therapy WFL for tasks assessed/performed           Past Medical History:  Diagnosis Date  . Anxiety   . Bipolar affective disorder, depressed (HCC)   . Hypertension   . Schizo-affective schizophrenia (HCC)   . Schizophrenia, schizo-affective (HCC)   . Seizure (HCC)   . Seizures (HCC)    Last month. gets a sensation    Past Surgical History:  Procedure Laterality Date  . ANKLE SURGERY    . CLOSED REDUCTION WITH HUMER PIN INSERTION     left ankle 1998  . HERNIA REPAIR     while in prison  . HERNIA REPAIR      Vitals:   12/25/19 1024  BP: (!) 149/76  Pulse: 70      Subjective Assessment - 12/25/19 1017    Subjective Balance has been off and on. Has had some good days and sometimes he struggles with his balance. Normally uses a cane, but left it at home. Has had 3 falls since he was last here - lost his balance and couldn't get his left leg on the step. Still having chronic back pain.    Pertinent History bipolar disorder, HTN, schizophrenia, hx of seizures    Limitations Walking;Standing    How long can you stand comfortably? 5-10 minutes    How long can you walk comfortably? reports can only take a few steps    Diagnostic tests MRI  of lumbar spine from 04/13/19: Multilevel lumbar disc degeneration with mild lateral recess andneural foraminal stenosis as above. No high-grade stenosis.    Patient Stated Goals wants to build up the strength in his legs and core    Currently in Pain? Yes   also reports he feels like he is having muscle spasms in his legs   Pain Score 7     Pain Location Back    Pain Orientation Lower    Pain Descriptors / Indicators Aching;Sore    Pain Type Chronic pain    Aggravating Factors  can't really explain it, may have to lay down for a little bit    Pain Relieving Factors rest              [facility] PT Assessment - 12/25/19 1025      Assessment   Medical Diagnosis gait abnormality/imbalance    Referring Provider (PT) Corky Downs, [place]    Onset Date/Surgical Date 12/21/19   leg weakness started approx. 1 yr ago   Hand Dominance Right    Prior Therapy received PT here in 2020 - was put on hold due to uncontrolled BP      Precautions   Precautions Fall  Balance Screen   Has the patient fallen in the past 6 months Yes    How many times? 3   all going up the stairs   Has the patient had a decrease in activity level because of a fear of falling?  Yes    Is the patient reluctant to leave their home because of a fear of falling?  Yes      Home Environment   Living Environment Private residence    Living Arrangements Other (Comment)   gf   Type of Home House    Home Access Stairs to enter   just a single step up   Entrance Stairs-Rails None    Home Layout One level    Home Equipment [place] - single point      Prior Function   Level of Independence Independent    Vocation On disability    Leisure likes lifting weights (hasn't been able to for a while), building things      Sensation   Light Touch Impaired Detail    Additional Comments incr difficulty with light touch in medial ankle vs lateral ankle      Coordination   Gross Motor Movements are Fluid and Coordinated No    Heel Shin  Test impaired B, unable to perform with full ROM      ROM / Strength   AROM / PROM / Strength Strength      Strength   Right Hip Flexion 3-/5    Right Hip Extension 3-/5   compensations by putting weight on opposite hip   Right Hip External Rotation  3-/5    Right Hip Internal Rotation 3/5    Right Hip ABduction 4+/5    Left Hip Flexion 3-/5    Left Hip Extension 3-/5   compensations by putting weight on opposite hip   Left Hip External Rotation 3-/5    Left Hip Internal Rotation 3/5    Left Hip ABduction 3/5    Right/Left Knee Right;Left    Right Knee Flexion 4+/5    Right Knee Extension 4/5    Left Knee Flexion 4+/5    Left Knee Extension 4+/5    Right Ankle Dorsiflexion 4-/5    Left Ankle Dorsiflexion 4-/5      Transfers   Transfers Sit to Stand;Stand to Sit    Sit to Stand 5: Supervision;Without upper extremity assist    Five time sit to stand comments  17.79 seconds with no UE support from standard height chair    Stand to Sit 5: Supervision;Without upper extremity assist    Comments 30 second chair stand: 8 sit <> stands no UE support      Ambulation/Gait   Ambulation/Gait Yes    Ambulation/Gait Assistance 5: Supervision    Ambulation Distance (Feet) --   clinic distances   Assistive device Straight cane;Other (Comment)   with 3 prong tip   Gait Pattern Step-through pattern;Decreased arm swing - left;Left foot flat;Decreased stance time - left    Ambulation Surface Level;Indoor    Gait velocity 17.32 = 1.89 ft/sec      Timed Up and Go Test   Normal TUG (seconds) 17.9    TUG Comments with SPC with 3 prong tip                      Objective measurements completed on examination: See above findings.               PT Education -  12/25/19 1058    Education Details clinical findings, POC    Person(s) Educated Patient    Methods Explanation    Comprehension Verbalized understanding            PT Short Term Goals - 12/28/19 1937       PT SHORT TERM GOAL #1   Title Patient will be independent with initial HEP for balance and ROM. ALL STGS DUE AFTER 3RD VISIT.    Baseline currently dependent.    Time 3   visits   Status New      PT SHORT TERM GOAL #2   Title Patient will decrease 5x sit <> stand time to 16.5 seconds or less without BUE support from lower arm chair to demo improved LE strength.    Baseline 17.79 seconds without UE support from standard height chair    Time 3   visits   Status New      PT SHORT TERM GOAL #3   Title Patient will improve gait speed with SPC to at least 2.2 ft/sec to demonstrate improved community mobility.    Baseline with SPC with 3 prong tip = 1.89 ft/sec    Time 3   visits   Status New      PT SHORT TERM GOAL #4   Title Pt will undergo further assessment of DGI to determine fall risk - LTG to be written as appropriate.    Baseline not yet assessed.    Time 3   visits   Status New              PT Long Term Goals - 12/28/19 1939      PT LONG TERM GOAL #1   Title Patient will be independent with final HEP for balance and ROM. ALL LTGS DUE AFTER 16TH VISIT.    Baseline currently dependent    Time 16   visits   Status New      PT LONG TERM GOAL #2   Title Patient will ambulate at least 25230' with supervision and no LOB with SPC while scanning environment in order to demo improved community mobility.    Baseline not yet assessed.    Time 16   visits   Status New      PT LONG TERM GOAL #3   Title Pt will improve 30 second chair stand to at least 11 sit <> stands with no UE support in order to demo improved functional BLE strength.    Baseline 8 sit <> stands    Time 16   visits   Status New      PT LONG TERM GOAL #4   Title DGI goal to be written as appropriate to determine fall risk.    Baseline not yet assessed.    Time 16   visits   Status New      PT LONG TERM GOAL #5   Title Pt will decr TUG time to 14 seconds or less with no AD vs. LRAD in order to demo decr fall  risk.    Baseline 17.9 seconds with SPC with 3 prong tip    Time 16   visits   Status New              Clinical Impression Statement Patient is a 59 year old male referred to Neuro OPPT for gait and balance impairments. Pt's PMH is significant for: bipolar disorder, HTN, schizophrenia, hx of seizures. The following deficits were present during the exam: Impaired  coordination, decr BLE strength, impaired sensation, gait abnormalities, balance impairments, decr endurance.  Pt also with chronic back pain. Pt previously seen here in 02/2019 for PT for same diagnosis, however was unable to participate in therapy due to uncontrolled BP, however pt's BP much more controlled during eval. Based on 5x sit <> stand, gait speed, and TUG pt is at an increased risk for falls. Pt would benefit from skilled PT to address these impairments and functional limitations to maximize functional mobility independence  Personal Factors and Comorbidities Past/Current Experience;Time since onset of injury/illness/exacerbation  Examination-Activity Limitations Locomotion Level;Stand;Stairs;Squat  Examination-Participation Restrictions Community Activity (outdoor activities)  Pt will benefit from skilled therapeutic intervention in order to improve on the following deficits Abnormal gait;Decreased activity tolerance;Decreased balance;Decreased coordination;Difficulty walking;Decreased strength;Postural dysfunction;Pain;Decreased mobility;Decreased endurance  Stability/Clinical Decision Making Evolving/Moderate complexity  Clinical Decision Making Moderate  Rehab Potential Fair  PT Frequency 1x / week (followed by 2x week for 6 weeks)  PT Duration 3 weeks  PT Treatment/Interventions ADLs/Self Care Home Management;Therapeutic exercise;Therapeutic activities;Functional mobility training;Stair training;Gait training;DME Instruction;Balance training;Neuromuscular re-education;Patient/family education;Energy  conservation;Vestibular  PT Next Visit Plan monitor BP. perform DGI. initial HEP for BLE strenghening/balance.  Consulted and Agree with Plan of Care Patient        Patient will benefit from skilled therapeutic intervention in order to improve the following deficits and impairments:     Visit Diagnosis: History of falling  Other abnormalities of gait and mobility  Unsteadiness on feet  Chronic bilateral low back pain without sciatica  Muscle weakness (generalized)     Problem List Patient Active Problem List   Diagnosis Date Noted  . Hepatitis C antibody test positive 05/21/2019  . Chest pain 03/29/2017  . Gastritis 03/29/2017  . Hematochezia 03/29/2017  . Hematemesis 03/29/2017  . Alcohol dependence with withdrawal, uncomplicated (HCC) 01/25/2015  . Post traumatic stress disorder (PTSD) 01/25/2015  . Schizoaffective disorder (HCC) 01/25/2015  . Cocaine abuse with cocaine-induced mood disorder (HCC) 01/25/2015  . MDD (major depressive disorder) 01/24/2015  . Substance induced mood disorder (HCC) 09/10/2011    Drake Leach, PT, DPT 12/25/2019, 12:25 PM  [facility] [address] [place], [place], [address] Phone: [phone]   Fax:  [phone]  Name: Truth Barot MRN: [id number] Date of Birth: May 18, 1960

## 2019-12-28 NOTE — Addendum Note (Signed)
Addended by: Drake Leach on: 12/28/2019 07:54 PM   Modules accepted: Orders

## 2020-01-06 ENCOUNTER — Ambulatory Visit: Payer: Medicaid Other | Admitting: Physical Therapy

## 2020-01-08 ENCOUNTER — Ambulatory Visit: Payer: Medicaid Other | Admitting: Internal Medicine

## 2020-01-13 ENCOUNTER — Ambulatory Visit: Payer: Medicaid Other | Admitting: Physical Therapy

## 2020-01-20 ENCOUNTER — Ambulatory Visit: Payer: Medicaid Other | Admitting: Physical Therapy

## 2020-01-20 ENCOUNTER — Encounter: Payer: Self-pay | Admitting: Physical Therapy

## 2020-01-20 ENCOUNTER — Other Ambulatory Visit: Payer: Self-pay

## 2020-01-20 VITALS — BP 160/99 | HR 69

## 2020-01-20 DIAGNOSIS — R2681 Unsteadiness on feet: Secondary | ICD-10-CM | POA: Insufficient documentation

## 2020-01-20 DIAGNOSIS — R2689 Other abnormalities of gait and mobility: Secondary | ICD-10-CM

## 2020-01-20 DIAGNOSIS — M6281 Muscle weakness (generalized): Secondary | ICD-10-CM | POA: Insufficient documentation

## 2020-01-20 DIAGNOSIS — G8929 Other chronic pain: Secondary | ICD-10-CM | POA: Insufficient documentation

## 2020-01-20 DIAGNOSIS — M545 Low back pain, unspecified: Secondary | ICD-10-CM | POA: Insufficient documentation

## 2020-01-20 DIAGNOSIS — Z9181 History of falling: Secondary | ICD-10-CM | POA: Insufficient documentation

## 2020-01-20 NOTE — Therapy (Signed)
[facility] [address] [place], [place], [address] Phone: [phone]   Fax:  [phone]  Physical Therapy Treatment- Arrived No Charge  Patient Details  Name: Adrian Alexander MRN: [id number] Date of Birth: 11/18/1960 Referring Provider (PT): Corky Downs, [place]   Encounter Date: 01/20/2020   PT End of Session - 01/20/20 1038    Visit Number 1   arrived no charge   Number of Visits 16   1x week for 3 weeks, 2x week for 6 weeks   Authorization Type CCME - 3 visits approved 01/13/20 - 02/02/20    PT Start Time 1017    PT Stop Time 1037    PT Time Calculation (min) 20 min    Activity Tolerance --   limited by high BP          Past Medical History:  Diagnosis Date  . Anxiety   . Bipolar affective disorder, depressed (HCC)   . Hypertension   . Schizo-affective schizophrenia (HCC)   . Schizophrenia, schizo-affective (HCC)   . Seizure (HCC)   . Seizures (HCC)    Last month. gets a sensation    Past Surgical History:  Procedure Laterality Date  . ANKLE SURGERY    . CLOSED REDUCTION WITH HUMER PIN INSERTION     left ankle 1998  . HERNIA REPAIR     while in prison  . HERNIA REPAIR      Vitals:   01/20/20 1019 01/20/20 1033  BP: (!) 199/113 (!) 160/99  Pulse: 67 69  SpO2:  98%     Subjective Assessment - 01/20/20 1020    Subjective Missed therapy last week because he felt like his BP was too high. Has been checking his BP at home. Has been having a lot of stress at home. Took his medication this morning and takes another dose at noon.    Pertinent History bipolar disorder, HTN, schizophrenia, hx of seizures    Limitations Walking;Standing    How long can you stand comfortably? 5-10 minutes    How long can you walk comfortably? reports can only take a few steps    Diagnostic tests MRI of lumbar spine from 04/13/19: Multilevel lumbar disc degeneration with mild lateral recess andneural foraminal stenosis as  above. No high-grade stenosis.    Patient Stated Goals wants to build up the strength in his legs and core           Pt arrived to therapy - did not bring in his [facility] and needed to hold onto the wall as needed. Assessed pt's BP in sitting at 199/113. Pt reports that he took his medication this morning and his next dose is going to be at noon. Pt reports that he has been under a lot of recent stress at home. Pt reporting no signs/symptoms of a CVA. Called pt's PCP and was able to speak on the phone to Dr. Corky Downs who advised PT to let the pt know to go to the ER. Dr. Corky Downs also spoke to the pt who also reported he has been having recent trouble breathing and pt was given the same instruction. Pt verbalized understanding of going to the ER immediately and called his sister to come pick him up to be driven to the ER.Re-assessed pt's BP at 160/99. Discussed with pt use of SPC at all times for safety during gait and instructed pt to call back to outpatient PT office to get re-scheduled after getting assessed and once BP is under  control. Pt to also go see cardiologist next week on the 9th - due to pt request, therapist printed out time and date of appt so pt would not forget.                             PT Short Term Goals - 12/28/19 1937      PT SHORT TERM GOAL #1   Title Patient will be independent with initial HEP for balance and ROM. ALL STGS DUE AFTER 3RD VISIT.    Baseline currently dependent.    Time 3   visits   Status New      PT SHORT TERM GOAL #2   Title Patient will decrease 5x sit <> stand time to 16.5 seconds or less without BUE support from lower arm chair to demo improved LE strength.    Baseline 17.79 seconds without UE support from standard height chair    Time 3   visits   Status New      PT SHORT TERM GOAL #3   Title Patient will improve gait speed with SPC to at least 2.2 ft/sec to demonstrate improved community mobility.    Baseline with SPC with 3  prong tip = 1.89 ft/sec    Time 3   visits   Status New      PT SHORT TERM GOAL #4   Title Pt will undergo further assessment of DGI to determine fall risk - LTG to be written as appropriate.    Baseline not yet assessed.    Time 3   visits   Status New             PT Long Term Goals - 12/28/19 1939      PT LONG TERM GOAL #1   Title Patient will be independent with final HEP for balance and ROM. ALL LTGS DUE AFTER 16TH VISIT.    Baseline currently dependent    Time 16   visits   Status New      PT LONG TERM GOAL #2   Title Patient will ambulate at least 59' with supervision and no LOB with SPC while scanning environment in order to demo improved community mobility.    Baseline not yet assessed.    Time 16   visits   Status New      PT LONG TERM GOAL #3   Title Pt will improve 30 second chair stand to at least 11 sit <> stands with no UE support in order to demo improved functional BLE strength.    Baseline 8 sit <> stands    Time 16   visits   Status New      PT LONG TERM GOAL #4   Title DGI goal to be written as appropriate to determine fall risk.    Baseline not yet assessed.    Time 16   visits   Status New      PT LONG TERM GOAL #5   Title Pt will decr TUG time to 14 seconds or less with no AD vs. LRAD in order to demo decr fall risk.    Baseline 17.9 seconds with SPC with 3 prong tip    Time 16   visits   Status New                  Patient will benefit from skilled therapeutic intervention in order to improve the following deficits and impairments:  Visit Diagnosis: Other abnormalities of gait and mobility     Problem List Patient Active Problem List   Diagnosis Date Noted  . Hepatitis C antibody test positive 05/21/2019  . Chest pain 03/29/2017  . Gastritis 03/29/2017  . Hematochezia 03/29/2017  . Hematemesis 03/29/2017  . Alcohol dependence with withdrawal, uncomplicated (HCC) 01/25/2015  . Post traumatic stress disorder (PTSD)  01/25/2015  . Schizoaffective disorder (HCC) 01/25/2015  . Cocaine abuse with cocaine-induced mood disorder (HCC) 01/25/2015  . MDD (major depressive disorder) 01/24/2015  . Substance induced mood disorder (HCC) 09/10/2011    Drake Leach, PT, DPT 01/20/2020, 10:40 AM  [facility] [address] [place], [place], [address] Phone: [phone]   Fax:  [phone]  Name: Adrian Alexander MRN: [id number] Date of Birth: [birth year]

## 2020-01-26 ENCOUNTER — Ambulatory Visit: Payer: Medicaid Other | Admitting: Internal Medicine

## 2020-01-26 NOTE — Progress Notes (Deleted)
Cardiology Office Note:    Date:  01/26/2020   ID:  Adrian Alexander, DOB [birth year], MRN [id number]  PCP:  Adrian Forest, MD  [facility] Cardiologist:  No primary care provider on file.  [facility] Electrophysiologist:  None   CC: Consulted for the evaluation of DOE at the behest of Adrian Forest, MD  History of Present Illness:    Adrian Alexander is a 59 y.o. male with a hx of HLD, prior Alcohol Abuse, Cocaine abuse, HTN who presents with evaluation of DOA.  Patient notes ***  Past Medical History:  Diagnosis Date  . Anxiety   . Bipolar affective disorder, depressed (HCC)   . Hypertension   . Schizo-affective schizophrenia (HCC)   . Schizophrenia, schizo-affective (HCC)   . Seizure (HCC)   . Seizures (HCC)    Last month. gets a sensation    Past Surgical History:  Procedure Laterality Date  . ANKLE SURGERY    . CLOSED REDUCTION WITH HUMER PIN INSERTION     left ankle 1998  . HERNIA REPAIR     while in prison  . HERNIA REPAIR      Current Medications: No outpatient medications have been marked as taking for the 01/26/20 encounter (Appointment) with Christell Constant, MD.     Allergies:   Patient has no known allergies.   Social History   Socioeconomic History  . Marital status: Divorced    Spouse name: Not on file  . Number of children: Not on file  . Years of education: Not on file  . Highest education level: Not on file  Occupational History  . Not on file  Tobacco Use  . Smoking status: Former Smoker    Packs/day: 0.10    Years: 20.00    Pack years: 2.00    Types: Cigarettes  . Smokeless tobacco: Current User    Types: Snuff  Substance and Sexual Activity  . Alcohol use: Yes    Alcohol/week: 42.0 standard drinks    Types: 42 Cans of beer per week  . Drug use: Yes    Types: Marijuana, Cocaine  . Sexual activity: Yes    Birth control/protection: Condom  Other Topics Concern  . Not on file  Social History Narrative    Lives in [place] alone   Social Determinants of Health   Financial Resource Strain:   . Difficulty of Paying Living Expenses: Not on file  Food Insecurity:   . Worried About Programme researcher, broadcasting/film/video in the Last Year: Not on file  . Ran Out of Food in the Last Year: Not on file  Transportation Needs:   . Lack of Transportation (Medical): Not on file  . Lack of Transportation (Non-Medical): Not on file  Physical Activity:   . Days of Exercise per Week: Not on file  . Minutes of Exercise per Session: Not on file  Stress:   . Feeling of Stress : Not on file  Social Connections:   . Frequency of Communication with Friends and Family: Not on file  . Frequency of Social Gatherings with Friends and Family: Not on file  . Attends Religious Services: Not on file  . Active Member of Clubs or Organizations: Not on file  . Attends Banker Meetings: Not on file  . Marital Status: Not on file     Family History: The patient's ***family history includes Cancer (age of onset: 105) in his mother; Heart attack (age of onset: 73) in his father; Heart  disease in his father.  ROS:   Please see the history of present illness.    *** All other systems reviewed and are negative.  EKGs/Labs/Other Studies Reviewed:    The following studies were reviewed today: ***  EKG:  EKG is *** ordered today.  The ekg ordered today demonstrates *** 12/07/19- sinus bradycardia 55 with LVH with secondary repolarization abnormalities  Recent Labs: 08/05/2019: ALT 27 12/05/2019: BUN 16; Creatinine, Ser 1.08; Hemoglobin 15.1; Platelets 265; Potassium 3.8; Sodium 139  Recent Lipid Panel    Component Value Date/Time   CHOL 116 03/30/2017 0523   TRIG 64 03/30/2017 0523   HDL 41 03/30/2017 0523   CHOLHDL 2.8 03/30/2017 0523   VLDL 13 03/30/2017 0523   LDLCALC 62 03/30/2017 0523     Risk Assessment/Calculations:   {Does this patient have ATRIAL FIBRILLATION?[phone]}   Physical Exam:    VS:   There were no vitals taken for this visit.    Wt Readings from Last 3 Encounters:  08/05/19 200 lb (90.7 kg)  04/09/19 208 lb (94.3 kg)  03/30/17 214 lb 14.4 oz (97.5 kg)     GEN: *** Well nourished, well developed in no acute distress HEENT: Normal NECK: No JVD; No carotid bruits LYMPHATICS: No lymphadenopathy CARDIAC: ***RRR, no murmurs, rubs, gallops RESPIRATORY:  Clear to auscultation without rales, wheezing or rhonchi  ABDOMEN: Soft, non-tender, non-distended MUSCULOSKELETAL:  No edema; No deformity  SKIN: Warm and dry NEUROLOGIC:  Alert and oriented x 3 PSYCHIATRIC:  Normal affect   ASSESSMENT:    No diagnosis found. PLAN:    In order of problems listed above:  1. ***    Shared Decision Making/Informed Consent      Medication Adjustments/Labs and Tests Ordered: Current medicines are reviewed at length with the patient today.  Concerns regarding medicines are outlined above.  No orders of the defined types were placed in this encounter.  No orders of the defined types were placed in this encounter.   There are no Patient Instructions on file for this visit.   Signed, Christell Constant, MD  01/26/2020 9:47 AM    [facility]

## 2020-01-28 ENCOUNTER — Emergency Department (HOSPITAL_COMMUNITY): Payer: Medicaid Other

## 2020-01-28 ENCOUNTER — Encounter (HOSPITAL_COMMUNITY): Payer: Self-pay

## 2020-01-28 ENCOUNTER — Emergency Department (HOSPITAL_COMMUNITY)
Admission: EM | Admit: 2020-01-28 | Discharge: 2020-01-28 | Disposition: A | Discharge status: Home / Self Care | Payer: Medicaid Other | Attending: Emergency Medicine | Admitting: Emergency Medicine

## 2020-01-28 DIAGNOSIS — S0093XA Contusion of unspecified part of head, initial encounter: Secondary | ICD-10-CM | POA: Insufficient documentation

## 2020-01-28 DIAGNOSIS — S090XXA Injury of blood vessels of head, not elsewhere classified, initial encounter: Secondary | ICD-10-CM | POA: Diagnosis present

## 2020-01-28 DIAGNOSIS — R079 Chest pain, unspecified: Secondary | ICD-10-CM | POA: Diagnosis not present

## 2020-01-28 DIAGNOSIS — Z5321 Procedure and treatment not carried out due to patient leaving prior to being seen by health care provider: Secondary | ICD-10-CM | POA: Insufficient documentation

## 2020-01-28 LAB — CBC
HCT: 45.2 % (ref 39.0–52.0)
Hemoglobin: 14.7 g/dL (ref 13.0–17.0)
MCH: 25.7 pg — ABNORMAL LOW (ref 26.0–34.0)
MCHC: 32.5 g/dL (ref 30.0–36.0)
MCV: 78.9 fL — ABNORMAL LOW (ref 80.0–100.0)
Platelets: 294 10*3/uL (ref 150–400)
RBC: 5.73 MIL/uL (ref 4.22–5.81)
RDW: 13.6 % (ref 11.5–15.5)
WBC: 6.2 10*3/uL (ref 4.0–10.5)
nRBC: 0 % (ref 0.0–0.2)

## 2020-01-28 LAB — BASIC METABOLIC PANEL
Anion gap: 15 (ref 5–15)
BUN: 7 mg/dL (ref 6–20)
CO2: 21 mmol/L — ABNORMAL LOW (ref 22–32)
Calcium: 8.7 mg/dL — ABNORMAL LOW (ref 8.9–10.3)
Chloride: 108 mmol/L (ref 98–111)
Creatinine, Ser: 0.88 mg/dL (ref 0.61–1.24)
GFR, Estimated: >60 mL/min (ref >60)
Glucose, Bld: 155 mg/dL — ABNORMAL HIGH (ref 70–99)
Potassium: 3.3 mmol/L — ABNORMAL LOW (ref 3.5–5.1)
Sodium: 144 mmol/L (ref 135–145)

## 2020-01-28 LAB — TROPONIN I (HIGH SENSITIVITY): Troponin I (High Sensitivity): 10 ng/L (ref <18)
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
## 2020-01-28 NOTE — ED Notes (Signed)
Pty asked if he was free to go, tech stated yes if he felt up to it but we encourage him to stay. Pt LWBS

## 2020-01-29 ENCOUNTER — Other Ambulatory Visit: Payer: Self-pay

## 2020-01-29 ENCOUNTER — Inpatient Hospital Stay (HOSPITAL_COMMUNITY)
Admission: RE | Admit: 2020-01-29 | Discharge: 2020-02-02 | DRG: 885 | Disposition: A | Discharge status: Home / Self Care | Payer: Medicaid Other | Attending: Emergency Medicine | Admitting: Emergency Medicine

## 2020-01-29 ENCOUNTER — Encounter (HOSPITAL_COMMUNITY): Payer: Self-pay | Admitting: Emergency Medicine

## 2020-01-29 DIAGNOSIS — R569 Unspecified convulsions: Secondary | ICD-10-CM

## 2020-01-29 DIAGNOSIS — M62838 Other muscle spasm: Secondary | ICD-10-CM | POA: Diagnosis present

## 2020-01-29 DIAGNOSIS — F329 Major depressive disorder, single episode, unspecified: Secondary | ICD-10-CM | POA: Diagnosis present

## 2020-01-29 DIAGNOSIS — F4310 Post-traumatic stress disorder, unspecified: Secondary | ICD-10-CM | POA: Diagnosis not present

## 2020-01-29 DIAGNOSIS — F250 Schizoaffective disorder, bipolar type: Principal | ICD-10-CM | POA: Diagnosis present

## 2020-01-29 DIAGNOSIS — Z23 Encounter for immunization: Secondary | ICD-10-CM

## 2020-01-29 DIAGNOSIS — F1994 Other psychoactive substance use, unspecified with psychoactive substance-induced mood disorder: Secondary | ICD-10-CM | POA: Diagnosis present

## 2020-01-29 DIAGNOSIS — Z20822 Contact with and (suspected) exposure to covid-19: Secondary | ICD-10-CM | POA: Diagnosis present

## 2020-01-29 DIAGNOSIS — K219 Gastro-esophageal reflux disease without esophagitis: Secondary | ICD-10-CM | POA: Diagnosis present

## 2020-01-29 DIAGNOSIS — F1414 Cocaine abuse with cocaine-induced mood disorder: Secondary | ICD-10-CM | POA: Diagnosis present

## 2020-01-29 DIAGNOSIS — F10230 Alcohol dependence with withdrawal, uncomplicated: Secondary | ICD-10-CM | POA: Diagnosis present

## 2020-01-29 DIAGNOSIS — F332 Major depressive disorder, recurrent severe without psychotic features: Secondary | ICD-10-CM | POA: Diagnosis not present

## 2020-01-29 DIAGNOSIS — G40909 Epilepsy, unspecified, not intractable, without status epilepticus: Secondary | ICD-10-CM | POA: Diagnosis present

## 2020-01-29 DIAGNOSIS — F419 Anxiety disorder, unspecified: Secondary | ICD-10-CM | POA: Diagnosis present

## 2020-01-29 DIAGNOSIS — F259 Schizoaffective disorder, unspecified: Secondary | ICD-10-CM | POA: Diagnosis present

## 2020-01-29 DIAGNOSIS — Z87891 Personal history of nicotine dependence: Secondary | ICD-10-CM

## 2020-01-29 DIAGNOSIS — I10 Essential (primary) hypertension: Secondary | ICD-10-CM | POA: Diagnosis present

## 2020-01-29 DIAGNOSIS — F251 Schizoaffective disorder, depressive type: Secondary | ICD-10-CM | POA: Diagnosis not present

## 2020-01-29 DIAGNOSIS — F333 Major depressive disorder, recurrent, severe with psychotic symptoms: Secondary | ICD-10-CM | POA: Diagnosis not present

## 2020-01-29 LAB — RESPIRATORY PANEL BY RT PCR (FLU A&B, COVID)
Influenza A by PCR: NEGATIVE
Influenza B by PCR: NEGATIVE
SARS Coronavirus 2 by RT PCR: NEGATIVE

## 2020-01-29 MED ORDER — MAGNESIUM HYDROXIDE 400 MG/5ML PO SUSP
30.0000 mL | Freq: Every day | ORAL | Status: DC | PRN
  Filled 2020-01-29: qty 30

## 2020-01-29 MED ORDER — ALUM & MAG HYDROXIDE-SIMETH 200-200-20 MG/5ML PO SUSP: 30.0000 mL | ORAL | Status: DC | PRN

## 2020-01-29 MED ORDER — HYDROXYZINE HCL 25 MG PO TABS
25.0000 mg | ORAL_TABLET | Freq: Three times a day (TID) | ORAL | Status: DC | PRN
  Administered 2020-01-31 – 2020-02-01 (×2): 25 mg via ORAL
  Filled 2020-01-29 (×2): qty 1

## 2020-01-29 MED ORDER — TRAZODONE HCL 50 MG PO TABS
50.0000 mg | ORAL_TABLET | Freq: Every evening | ORAL | Status: DC | PRN
  Filled 2020-01-29: qty 1

## 2020-01-29 MED ORDER — ACETAMINOPHEN 325 MG PO TABS: 650.0000 mg | ORAL_TABLET | Freq: Four times a day (QID) | ORAL | Status: DC | PRN

## 2020-01-29 NOTE — ED Triage Notes (Signed)
Patient transferred from [facility] behavior health for hypertension , BP= 180/106 this evening , denies headache /no emesis , respirations unlabored.
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
## 2020-01-29 NOTE — ED Triage Notes (Signed)
Pt was seen at [facility] and sent here for medical clearance. Pt was c/o dizziness and was hypertensive there
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
## 2020-01-29 NOTE — Progress Notes (Addendum)
Marijuana was found in patient's coat during search of his property.  The tech informed security.  Security and tech disposed of it together down the toilet.

## 2020-01-30 ENCOUNTER — Encounter (HOSPITAL_COMMUNITY): Payer: Self-pay | Admitting: Physician Assistant

## 2020-01-30 ENCOUNTER — Other Ambulatory Visit: Payer: Self-pay

## 2020-01-30 DIAGNOSIS — F1994 Other psychoactive substance use, unspecified with psychoactive substance-induced mood disorder: Secondary | ICD-10-CM

## 2020-01-30 DIAGNOSIS — F251 Schizoaffective disorder, depressive type: Secondary | ICD-10-CM

## 2020-01-30 DIAGNOSIS — I10 Essential (primary) hypertension: Secondary | ICD-10-CM

## 2020-01-30 DIAGNOSIS — R569 Unspecified convulsions: Secondary | ICD-10-CM

## 2020-01-30 DIAGNOSIS — F4310 Post-traumatic stress disorder, unspecified: Secondary | ICD-10-CM

## 2020-01-30 DIAGNOSIS — F1414 Cocaine abuse with cocaine-induced mood disorder: Secondary | ICD-10-CM

## 2020-01-30 DIAGNOSIS — F10230 Alcohol dependence with withdrawal, uncomplicated: Secondary | ICD-10-CM

## 2020-01-30 DIAGNOSIS — F333 Major depressive disorder, recurrent, severe with psychotic symptoms: Secondary | ICD-10-CM

## 2020-01-30 LAB — PHENYTOIN LEVEL, TOTAL: Phenytoin Lvl: <10 ug/mL — ABNORMAL LOW (ref 10.0–20.0)

## 2020-01-30 LAB — COMPREHENSIVE METABOLIC PANEL WITH GFR
ALT: 21 U/L (ref 0–44)
AST: 26 U/L (ref 15–41)
Albumin: 3.8 g/dL (ref 3.5–5.0)
Alkaline Phosphatase: 41 U/L (ref 38–126)
Anion gap: 10 (ref 5–15)
BUN: 14 mg/dL (ref 6–20)
CO2: 25 mmol/L (ref 22–32)
Calcium: 9 mg/dL (ref 8.9–10.3)
Chloride: 100 mmol/L (ref 98–111)
Creatinine, Ser: 0.96 mg/dL (ref 0.61–1.24)
GFR, Estimated: >60 mL/min
Glucose, Bld: 113 mg/dL — ABNORMAL HIGH (ref 70–99)
Potassium: 3.3 mmol/L — ABNORMAL LOW (ref 3.5–5.1)
Sodium: 135 mmol/L (ref 135–145)
Total Bilirubin: 0.9 mg/dL (ref 0.3–1.2)
Total Protein: 6.7 g/dL (ref 6.5–8.1)

## 2020-01-30 LAB — CBC WITH DIFFERENTIAL/PLATELET
Abs Immature Granulocytes: 0.01 10*3/uL (ref 0.00–0.07)
Basophils Absolute: 0 10*3/uL (ref 0.0–0.1)
Basophils Relative: 1 %
Eosinophils Absolute: 0.1 10*3/uL (ref 0.0–0.5)
Eosinophils Relative: 1 %
HCT: 44.3 % (ref 39.0–52.0)
Hemoglobin: 14.2 g/dL (ref 13.0–17.0)
Immature Granulocytes: 0 %
Lymphocytes Relative: 33 %
Lymphs Abs: 1.6 10*3/uL (ref 0.7–4.0)
MCH: 25.6 pg — ABNORMAL LOW (ref 26.0–34.0)
MCHC: 32.1 g/dL (ref 30.0–36.0)
MCV: 79.8 fL — ABNORMAL LOW (ref 80.0–100.0)
Monocytes Absolute: 0.6 10*3/uL (ref 0.1–1.0)
Monocytes Relative: 11 %
Neutro Abs: 2.6 10*3/uL (ref 1.7–7.7)
Neutrophils Relative %: 54 %
Platelets: 295 10*3/uL (ref 150–400)
RBC: 5.55 MIL/uL (ref 4.22–5.81)
RDW: 13.6 % (ref 11.5–15.5)
WBC: 4.8 10*3/uL (ref 4.0–10.5)
nRBC: 0 % (ref 0.0–0.2)

## 2020-01-30 LAB — LIPID PANEL
Cholesterol: 144 mg/dL (ref 0–200)
HDL: 58 mg/dL (ref >40)
LDL Cholesterol: 80 mg/dL (ref 0–99)
Total CHOL/HDL Ratio: 2.5 RATIO
Triglycerides: 31 mg/dL (ref <150)
VLDL: 6 mg/dL (ref 0–40)

## 2020-01-30 LAB — TSH: TSH: 0.633 u[IU]/mL (ref 0.350–4.500)

## 2020-01-30 LAB — HEMOGLOBIN A1C
Hgb A1c MFr Bld: 5.4 % (ref 4.8–5.6)
Mean Plasma Glucose: 108.28 mg/dL

## 2020-01-30 MED ORDER — POTASSIUM CHLORIDE CRYS ER 10 MEQ PO TBCR
10.0000 meq | EXTENDED_RELEASE_TABLET | Freq: Every day | ORAL | Status: DC
  Administered 2020-01-30 – 2020-02-02 (×4): 10 meq via ORAL
  Filled 2020-01-30 (×7): qty 1

## 2020-01-30 MED ORDER — DOCUSATE SODIUM 100 MG PO CAPS: 100.0000 mg | ORAL_CAPSULE | Freq: Every day | ORAL | Status: DC | PRN

## 2020-01-30 MED ORDER — PNEUMOCOCCAL VAC POLYVALENT 25 MCG/0.5ML IJ INJ
0.5000 mL | INJECTION | INTRAMUSCULAR | Status: AC
  Administered 2020-01-31: 0.5 mL via INTRAMUSCULAR
  Filled 2020-01-30: qty 0.5

## 2020-01-30 MED ORDER — CLONIDINE HCL 0.3 MG PO TABS
0.3000 mg | ORAL_TABLET | Freq: Two times a day (BID) | ORAL | Status: DC
  Administered 2020-01-30: 0.3 mg via ORAL
  Filled 2020-01-30: qty 1
  Filled 2020-01-30: qty 3
  Filled 2020-01-30 (×4): qty 1

## 2020-01-30 MED ORDER — ADULT MULTIVITAMIN W/MINERALS CH
1.0000 | ORAL_TABLET | Freq: Every day | ORAL | Status: DC
  Administered 2020-01-30 – 2020-02-02 (×4): 1 via ORAL
  Filled 2020-01-30 (×7): qty 1

## 2020-01-30 MED ORDER — PRAZOSIN HCL 1 MG PO CAPS
1.0000 mg | ORAL_CAPSULE | Freq: Every day | ORAL | Status: DC
  Administered 2020-01-31 – 2020-02-01 (×2): 1 mg via ORAL
  Filled 2020-01-30 (×5): qty 1

## 2020-01-30 MED ORDER — PANTOPRAZOLE SODIUM 40 MG PO TBEC
80.0000 mg | DELAYED_RELEASE_TABLET | Freq: Two times a day (BID) | ORAL | Status: DC
  Administered 2020-01-30 – 2020-02-02 (×8): 80 mg via ORAL
  Filled 2020-01-30 (×12): qty 2

## 2020-01-30 MED ORDER — AMLODIPINE BESYLATE 10 MG PO TABS
10.0000 mg | ORAL_TABLET | Freq: Every day | ORAL | Status: DC
  Administered 2020-01-31 – 2020-02-02 (×3): 10 mg via ORAL
  Filled 2020-01-30 (×4): qty 1
  Filled 2020-01-30: qty 2
  Filled 2020-01-30 (×2): qty 1

## 2020-01-30 MED ORDER — PHENYTOIN SODIUM EXTENDED 100 MG PO CAPS
100.0000 mg | ORAL_CAPSULE | Freq: Every day | ORAL | Status: DC
  Administered 2020-01-30 – 2020-02-02 (×4): 100 mg via ORAL
  Filled 2020-01-30 (×7): qty 1

## 2020-01-30 MED ORDER — HYDROCHLOROTHIAZIDE 25 MG PO TABS
25.0000 mg | ORAL_TABLET | Freq: Every day | ORAL | Status: DC
  Administered 2020-01-31 – 2020-02-02 (×3): 25 mg via ORAL
  Filled 2020-01-30 (×7): qty 1

## 2020-01-30 MED ORDER — PAROXETINE HCL 20 MG PO TABS
20.0000 mg | ORAL_TABLET | Freq: Every day | ORAL | Status: DC
  Filled 2020-01-30 (×3): qty 1

## 2020-01-30 MED ORDER — CYCLOBENZAPRINE HCL 10 MG PO TABS
5.0000 mg | ORAL_TABLET | Freq: Three times a day (TID) | ORAL | Status: DC | PRN
  Administered 2020-01-30 – 2020-02-02 (×2): 5 mg via ORAL
  Filled 2020-01-30 (×2): qty 1

## 2020-01-30 MED ORDER — TERBINAFINE HCL 250 MG PO TABS
250.0000 mg | ORAL_TABLET | Freq: Every day | ORAL | Status: DC
  Administered 2020-01-30 – 2020-02-02 (×4): 250 mg via ORAL
  Filled 2020-01-30 (×6): qty 1

## 2020-01-30 MED ORDER — MELOXICAM 7.5 MG PO TABS
7.5000 mg | ORAL_TABLET | Freq: Every day | ORAL | Status: DC
  Administered 2020-01-30 – 2020-02-02 (×4): 7.5 mg via ORAL
  Filled 2020-01-30 (×9): qty 1

## 2020-01-30 MED ORDER — QUETIAPINE FUMARATE 400 MG PO TABS
400.0000 mg | ORAL_TABLET | Freq: Every day | ORAL | Status: DC
  Administered 2020-01-30 – 2020-02-01 (×3): 400 mg via ORAL
  Filled 2020-01-30: qty 2
  Filled 2020-01-30: qty 1
  Filled 2020-01-30: qty 2
  Filled 2020-01-30 (×4): qty 1

## 2020-01-30 MED ORDER — VITAMIN D3 25 MCG (1000 UNIT) PO TABS
2000.0000 [IU] | ORAL_TABLET | Freq: Every day | ORAL | Status: DC
  Administered 2020-01-30 – 2020-02-02 (×4): 2000 [IU] via ORAL
  Filled 2020-01-30 (×7): qty 2

## 2020-01-30 MED ORDER — NAPROXEN 500 MG PO TABS
500.0000 mg | ORAL_TABLET | ORAL | Status: DC
  Administered 2020-01-30 – 2020-02-02 (×3): 500 mg via ORAL
  Filled 2020-01-30 (×5): qty 1

## 2020-01-30 MED ORDER — AMLODIPINE BESYLATE 5 MG PO TABS
10.0000 mg | ORAL_TABLET | Freq: Once | ORAL | Status: AC
  Administered 2020-01-30: 10 mg via ORAL
  Filled 2020-01-30: qty 2

## 2020-01-30 MED ORDER — DICLOFENAC SODIUM 1 % EX GEL
2.0000 g | Freq: Four times a day (QID) | CUTANEOUS | Status: DC
  Administered 2020-01-30 – 2020-02-02 (×3): 2 g via TOPICAL
  Filled 2020-01-30 (×2): qty 100

## 2020-01-30 MED ORDER — HYDROXYZINE HCL 25 MG PO TABS
25.0000 mg | ORAL_TABLET | Freq: Every day | ORAL | Status: DC
  Administered 2020-01-30 – 2020-02-02 (×4): 25 mg via ORAL
  Filled 2020-01-30 (×8): qty 1

## 2020-01-30 MED ORDER — CYCLOBENZAPRINE HCL 5 MG PO TABS: 5.0000 mg | ORAL_TABLET | Freq: Three times a day (TID) | ORAL | Status: DC

## 2020-01-30 MED ORDER — LISINOPRIL 10 MG PO TABS
10.0000 mg | ORAL_TABLET | Freq: Once | ORAL | Status: AC
  Administered 2020-01-30: 10 mg via ORAL
  Filled 2020-01-30: qty 1

## 2020-01-30 MED ORDER — TRAMADOL HCL 50 MG PO TABS
50.0000 mg | ORAL_TABLET | Freq: Four times a day (QID) | ORAL | Status: DC | PRN
  Administered 2020-01-31: 50 mg via ORAL
  Filled 2020-01-30: qty 1

## 2020-01-30 NOTE — ED Triage Notes (Signed)
Patient transferred from [facility] due to hypertension , BP= 180/106 this evening , denies headache or blurred vision /no pain or SOB , denies fever or chills.

## 2020-01-30 NOTE — Progress Notes (Signed)
D. Pt observed sitting in the dayroom watching tv. Pt reports "feeling better". BP @ 93/56   A. Pt agrees to continue to force fluids- Pt provided with more Gatorade.   R. Pt remains safe with 15 minute checks. Will continue POC.

## 2020-01-30 NOTE — [facility] Group Notes (Signed)
.  Psychoeducational Group Note    Date:01/30/2020 Time: 1300-1400    Life Skills:  A group where two lists are made. What people need and what are things that we do that are healthy. The lists are developed by the patients and it is explained that we often do the actions that are not healthy to get our list of needs met.   Purpose of Group: . The group focus' on teaching patients on how to identify their needs and how to develop the coping skills needed to get their needs met  Participation Level:  Active  Participation Quality:  Appropriate  Affect:  Appropriate  Cognitive:  Oriented  Insight:  Improving  Engagement in Group:  Engaged  Additional Comments:  Pt rates his energy as a 6/10. Partisipated in the group and adding to it.  Paulino Rily

## 2020-01-30 NOTE — Progress Notes (Signed)
   01/30/20 1600  Psych Admission Type (Psych Patients Only)  Admission Status Voluntary  Psychosocial Assessment  Patient Complaints Other (Comment) (weakness)  Eye Contact Brief  Facial Expression Sad  Affect Appropriate to circumstance  Speech Logical/coherent  Interaction Assertive  Motor Activity Unsteady  Appearance/Hygiene Unremarkable  Behavior Characteristics Cooperative  Mood Depressed  Aggressive Behavior  Effect No apparent injury  Thought Process  Coherency WDL  Content WDL  Delusions None reported or observed  Perception WDL  Hallucination None reported or observed  Judgment Poor  Confusion None

## 2020-01-30 NOTE — Progress Notes (Addendum)
Patient ID: Adrian Alexander, male   DOB: 08-11-1960, 59 y.o.   MRN: [id number] Patient back at [facility] after medical clearance to return, BP continues to be elevated at 154/102 this morning and a HR of 55. Patient denies any lightheadedness/dizziness, denies having a headache, and is asymptomatic. Pt however, complains of "muscle stiffening in my legs", states that he had this prior to hospitalization, and it resulted in falls at home. Pt has been placed on fall precautions, offered a walker and declined, PT consult will be placed per protocol, non skid stockings on arm, yellow arm band on, pt has been educated to walk carefully, and with care, and to slowly rise from a sitting position, and to call for assistance if feeling dizzy. Pt has verbalized understanding, Q15 minute checks for safety in place. Will report to oncoming shift.

## 2020-01-30 NOTE — Progress Notes (Signed)
D. Pt reported 'feeling weak' after lunch, stating, "my head doesn't feel right. I feel my blood pressure is too low".  A:  BP obtained : 82/55. Pt provided with pitcher of Gatorade and encouraged to force fluids. MD notified R. Pt remains safe with 15 minute checks. Will continue to monitor.
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
## 2020-01-30 NOTE — ED Provider Notes (Signed)
[facility] EMERGENCY DEPARTMENT Provider Note   CSN: [id number] Arrival date & time: 01/29/20  2356     History Chief Complaint  Patient presents with  . Hypertension    Adrian Alexander is a 59 y.o. male.  The history is provided by the patient and medical records.   59 year old male with history of anxiety, bipolar disorder, hypertension, schizoaffective disorder, presenting to the ED from [facility] for evaluation of his BP.  States he has been "going through some stuff" so has not taken his medications in about 1 week.  He does have longstanding history of HTN, generally fairly well controlled.  Denies any headaches, chest pain, SOB, dizziness, confusion, numbness, or weakness.    Past Medical History:  Diagnosis Date  . Anxiety   . Bipolar affective disorder, depressed (HCC)   . Hypertension   . Schizo-affective schizophrenia (HCC)   . Schizophrenia, schizo-affective (HCC)   . Seizure (HCC)   . Seizures (HCC)    Last month. gets a sensation    Patient Active Problem List   Diagnosis Date Noted  . MDD (major depressive disorder), recurrent episode, severe (HCC) 01/29/2020  . Hepatitis C antibody test positive 05/21/2019  . Chest pain 03/29/2017  . Gastritis 03/29/2017  . Hematochezia 03/29/2017  . Hematemesis 03/29/2017  . Alcohol dependence with withdrawal, uncomplicated (HCC) 01/25/2015  . Post traumatic stress disorder (PTSD) 01/25/2015  . Schizoaffective disorder (HCC) 01/25/2015  . Cocaine abuse with cocaine-induced mood disorder (HCC) 01/25/2015  . MDD (major depressive disorder) 01/24/2015  . Substance induced mood disorder (HCC) 09/10/2011    Past Surgical History:  Procedure Laterality Date  . ANKLE SURGERY    . CLOSED REDUCTION WITH HUMER PIN INSERTION     left ankle 1998  . HERNIA REPAIR     while in prison  . HERNIA REPAIR         Family History  Problem Relation Age of Onset  . Cancer Mother 50       colon  . Heart disease  Father   . Heart attack Father 31    Social History   Tobacco Use  . Smoking status: Former Smoker    Packs/day: 0.10    Years: 20.00    Pack years: 2.00    Types: Cigarettes  . Smokeless tobacco: Current User    Types: Snuff  Substance Use Topics  . Alcohol use: Yes    Alcohol/week: 42.0 standard drinks    Types: 42 Cans of beer per week  . Drug use: Yes    Types: Marijuana, Cocaine    Home Medications Prior to Admission medications   Medication Sig Start Date End Date Taking? Authorizing Provider  amLODipine (NORVASC) 10 MG tablet Take 10 mg by mouth daily. 07/30/19   [provider]  cholecalciferol (VITAMIN D) 1000 UNITS tablet Take 2 tablets (2,000 Units total) by mouth daily. Patient not taking: Reported on 03/29/2017 01/27/15   [name], NP  cloNIDine (CATAPRES) 0.3 MG tablet Take 1 tablet (0.3 mg total) by mouth 2 (two) times daily. Patient taking differently: Take 0.3 mg by mouth 3 (three) times daily.  02/07/15   Henrietta Hoover, NP  cyclobenzaprine (FLEXERIL) 5 MG tablet Take 5 mg by mouth 3 (three) times daily. 03/14/17   [provider]  diazepam (VALIUM) 5 MG tablet Take 5 mg by mouth 2 (two) times daily as needed for anxiety.  04/30/19   [provider]  diclofenac Sodium (VOLTAREN) 1 %  GEL Apply 2 g topically 4 (four) times daily. 04/09/19   Gailen Shelter, PA  docusate sodium (COLACE) 100 MG capsule Take 100 mg by mouth daily as needed for mild constipation.     [provider]  gabapentin (NEURONTIN) 400 MG capsule Take 1 capsule (400 mg total) by mouth 3 (three) times daily. 08/07/19 10/06/19  Joy, Shawn C, PA-C  guaiFENesin (ROBITUSSIN) 100 MG/5ML liquid Take 5-10 mLs (100-200 mg total) by mouth every 4 (four) hours as needed for cough or congestion. 12/05/19   Fayrene Helper, PA-C  hydrochlorothiazide (HYDRODIURIL) 25 MG tablet Take 1 tablet (25 mg total) by mouth daily. 08/07/19   Joy, Shawn C, PA-C  hydrOXYzine  (ATARAX/VISTARIL) 25 MG tablet Take 1 tablet (25 mg total) by mouth daily. 08/07/19   Joy, Shawn C, PA-C  lisinopril (ZESTRIL) 40 MG tablet Take 1 tablet (40 mg total) by mouth daily. 08/07/19 10/06/19  Joy, Shawn C, PA-C  meloxicam (MOBIC) 7.5 MG tablet Take 7.5 mg by mouth daily.  11/07/18   [provider]  Multiple Vitamin (MULTIVITAMIN WITH MINERALS) TABS tablet Take 1 tablet by mouth daily. 01/27/15   [name], NP  naproxen (NAPROSYN) 500 MG tablet Take 500 mg by mouth daily.     [provider]  pantoprazole (PROTONIX) 40 MG tablet Take 2 tablets (80 mg total) by mouth 2 (two) times daily. Patient taking differently: Take 40 mg by mouth daily.  03/30/17   Levora Dredge, MD  PARoxetine (PAXIL) 20 MG tablet Take 20 mg by mouth daily. 06/05/19   [provider]  phenytoin (DILANTIN) 100 MG ER capsule Take 1 capsule (100 mg total) by mouth daily. 08/07/19   Joy, Shawn C, PA-C  potassium chloride (MICRO-K) 10 MEQ CR capsule Take 10 mEq by mouth daily.     [provider]  prazosin (MINIPRESS) 1 MG capsule Take 1 mg by mouth at bedtime.  06/05/19   [provider]  QUEtiapine (SEROQUEL) 400 MG tablet Take 1 tablet (400 mg total) by mouth at bedtime. 08/07/19   Joy, Shawn C, PA-C  terbinafine (LAMISIL) 250 MG tablet Take 250 mg by mouth daily.     [provider]  traMADol (ULTRAM) 50 MG tablet Take 1 tablet (50 mg total) by mouth every 6 (six) hours as needed. Patient taking differently: Take 50 mg by mouth every 6 (six) hours as needed for moderate pain.  04/13/19   Gwyneth Sprout, MD    Allergies    Patient has no known allergies.  Review of Systems   Review of Systems  Constitutional:       Elevated BP  All other systems reviewed and are negative.   Physical Exam Updated Vital Signs BP (!) 150/90   Pulse (!) 58   Temp 98.2 F (36.8 C) (Oral)   Resp 18   Ht 6\' 1"  (1.854 m)   Wt 95 kg   SpO2 99%   BMI 27.63 kg/m    Physical Exam Vitals and nursing note reviewed.  Constitutional:      Appearance: He is well-developed.  HENT:     Head: Normocephalic and atraumatic.  Eyes:     Conjunctiva/sclera: Conjunctivae normal.     Pupils: Pupils are equal, round, and reactive to light.  Cardiovascular:     Rate and Rhythm: Normal rate and regular rhythm.     Heart sounds: Normal heart sounds.  Pulmonary:     Effort: Pulmonary effort is normal.  Breath sounds: Normal breath sounds.  Abdominal:     General: Bowel sounds are normal. There is no distension.     Palpations: Abdomen is soft.     Hernia: No hernia is present.  Musculoskeletal:        General: Normal range of motion.     Cervical back: Normal range of motion.  Skin:    General: Skin is warm and dry.  Neurological:     Mental Status: He is alert and oriented to person, place, and time.     ED Results / Procedures / Treatments   Labs (all labs ordered are listed, but only abnormal results are displayed) Labs Reviewed  CBC WITH DIFFERENTIAL/PLATELET - Abnormal; Notable for the following components:      Result Value   MCV 79.8 (*)    MCH 25.6 (*)    All other components within normal limits  COMPREHENSIVE METABOLIC PANEL - Abnormal; Notable for the following components:   Potassium 3.3 (*)    Glucose, Bld 113 (*)    All other components within normal limits  RESPIRATORY PANEL BY RT PCR (FLU A&B, COVID)  HEMOGLOBIN A1C  LIPID PANEL  TSH  RAPID URINE DRUG SCREEN, HOSP PERFORMED    EKG None  Radiology No results found.  Procedures Procedures (including critical care time)  Medications Ordered in ED Medications  acetaminophen (TYLENOL) tablet 650 mg (has no administration in time range)  alum & mag hydroxide-simeth (MAALOX/MYLANTA) 200-200-20 MG/5ML suspension 30 mL (has no administration in time range)  magnesium hydroxide (MILK OF MAGNESIA) suspension 30 mL (has no administration in time range)  hydrOXYzine  (ATARAX/VISTARIL) tablet 25 mg (has no administration in time range)  traZODone (DESYREL) tablet 50 mg (has no administration in time range)  amLODipine (NORVASC) tablet 10 mg (10 mg Oral Given 01/30/20 0214)  lisinopril (ZESTRIL) tablet 10 mg (10 mg Oral Given 01/30/20 7169)    ED Course  I have reviewed the triage vital signs and the nursing notes.  Pertinent labs & imaging results that were available during my care of the patient were reviewed by me and considered in my medical decision making (see chart for details).    MDM Rules/Calculators/A&P  59 year old male presenting to the ED from [facility] for elevated blood pressure reading.  States he has been "going through some stuff" has not had his medications in about 1 week.  He is asymptomatic of this.  BP on arrival 166/107 and downtrended to 150/90 without intervention.  Labs reassuring.  Do not suspect any end organ damage.  Given dose of his home meds.    Spoke with [facility] NP Adela Lank-- fine to transfer back at this time.   Final Clinical Impression(s) / ED Diagnoses Final diagnoses:  Primary hypertension    Rx / DC Orders ED Discharge Orders    None       Garlon Hatchet, PA-C 01/30/20 0305    Palumbo, April, MD 01/30/20 0425

## 2020-01-30 NOTE — [facility] Group Notes (Signed)
.  Psychoeducational Group Note  Date: 01-29-20 Time: 0900-1000    Goal Setting   Purpose of Group: This group helps to provide patients with the steps of setting a goal that is specific, measurable, attainable, realistic and time specific. A discussion on how we keep ourselves stuck with negative self talk.    Participation Level:  Active  Participation Quality:  Appropriate  Affect:  Appropriate  Cognitive:  Appropriate  Insight:  Improving  Engagement in Group:  Engaged  Additional Comments:  Pt was able to attend the group. Rates his energy at a 5/10.  Wants to speak with the Doctor concerning what medications he is on and how to take them. Was encouraged to speak write down his concerns so when he meets with the doctor he will be able to concisely express himself. States "my head is all jumbled up". Also encouraged to speak with the nurse.Sharee Pimple, Delorse Limber
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
## 2020-01-30 NOTE — Progress Notes (Signed)
Patient rated his day as a 7 out of a possible 10 since he had blood pressure issues. No additional details were provided about his day. His goal for tomorrow is to continue to work on his health issues.

## 2020-01-30 NOTE — H&P (Signed)
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  See MAR    I certify that inpatient services furnished can reasonably be expected to improve the patient's condition.    Laveda Abbe, NP 11/13/20211:10 PM

## 2020-01-30 NOTE — Progress Notes (Signed)
Abnormal ECG  EKG results placed on the outside of patient's shadow chart.

## 2020-01-30 NOTE — Progress Notes (Signed)
   01/30/20 2110  COVID-19 Daily Checkoff  Have you had a fever (temp > 37.80C/100F)  in the past 24 hours?  No  COVID-19 EXPOSURE  Have you traveled outside the state in the past 14 days? No  Have you been in contact with someone with a confirmed diagnosis of COVID-19 or PUI in the past 14 days without wearing appropriate PPE? No  Have you been living in the same home as a person with confirmed diagnosis of COVID-19 or a PUI (household contact)? No  Have you been diagnosed with COVID-19? No
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
## 2020-01-31 DIAGNOSIS — F4310 Post-traumatic stress disorder, unspecified: Secondary | ICD-10-CM

## 2020-01-31 DIAGNOSIS — I10 Essential (primary) hypertension: Secondary | ICD-10-CM

## 2020-01-31 DIAGNOSIS — F1994 Other psychoactive substance use, unspecified with psychoactive substance-induced mood disorder: Secondary | ICD-10-CM

## 2020-01-31 DIAGNOSIS — F250 Schizoaffective disorder, bipolar type: Principal | ICD-10-CM

## 2020-01-31 DIAGNOSIS — R569 Unspecified convulsions: Secondary | ICD-10-CM

## 2020-01-31 LAB — RAPID URINE DRUG SCREEN, HOSP PERFORMED
Amphetamines: NOT DETECTED
Barbiturates: NOT DETECTED
Benzodiazepines: NOT DETECTED
Cocaine: NOT DETECTED
Opiates: NOT DETECTED
Tetrahydrocannabinol: POSITIVE — AB

## 2020-01-31 MED ORDER — GABAPENTIN 400 MG PO CAPS
400.0000 mg | ORAL_CAPSULE | Freq: Three times a day (TID) | ORAL | Status: DC
  Administered 2020-01-31 – 2020-02-02 (×7): 400 mg via ORAL
  Filled 2020-01-31 (×12): qty 1

## 2020-01-31 NOTE — Progress Notes (Addendum)
   01/31/20 0900  Psych Admission Type (Psych Patients Only)  Admission Status Voluntary  Psychosocial Assessment  Patient Complaints Sadness  Eye Contact Brief  Facial Expression Sad;Worried  Affect Appropriate to circumstance  Speech Logical/coherent  Interaction Assertive  Motor Activity Slow;Unsteady  Appearance/Hygiene Unremarkable  Behavior Characteristics Cooperative;Anxious  Mood Depressed;Anxious  Aggressive Behavior  Effect No apparent injury  Thought Process  Coherency WDL  Content WDL  Delusions None reported or observed  Perception WDL  Hallucination None reported or observed  Judgment Poor  Confusion None  Danger to Self  Current suicidal ideation? Denies  Danger to Others  Danger to Others None reported or observed    D. Pt continues to present as depressed- observed in the dayroom today attending groups. Per pt's self inventory, pt rated his depression, hopelessness and anxiety all 7's. Pt endorsed some passive SI- with no plan -agrees to contact staff before acting on any harmful thoughts.  A. Labs and vitals monitored. Pt compliant with medications. Pt supported emotionally and encouraged to express concerns and ask questions.   R. Pt remains safe with 15 minute checks. Will continue POC.

## 2020-01-31 NOTE — Progress Notes (Signed)
Pt was present in the dayroom tonight and he did attend group. Pt appears sad and sullen. He continues to use his wheelchair due to generalized muscle weakness and he remains on high fall risk prevention protocol. He did c/o feeling lightheaded tonight. PO fluids continue to be encouraged and provided. His blood pressure at 2035 was stable at 120/68. His goal is to stay clean and to attend a rehab program. He said that he wasn't taking his medications for 1.5 weeks so he plans on continuing those as well upon discharge. Pt denies SI/HI and AVH. Active listening, reassurance, and support provided. Medications administered as ordered by MD. Q 15 min safety checks continue. Pt's safety has been maintained.   01/30/20 2110  Psych Admission Type (Psych Patients Only)  Admission Status Voluntary  Psychosocial Assessment  Patient Complaints Depression;Sadness  Eye Contact Brief  Facial Expression Sad;Sullen;Worried  Affect Appropriate to circumstance  Speech Logical/coherent  Interaction Assertive  Motor Activity Slow;Unsteady  Appearance/Hygiene Unremarkable  Behavior Characteristics Cooperative;Anxious  Mood Depressed;Sad;Sullen  Thought Process  Coherency WDL  Content WDL  Delusions None reported or observed  Perception WDL  Hallucination None reported or observed  Judgment Poor  Confusion None  Danger to Self  Current suicidal ideation? Denies  Danger to Others  Danger to Others None reported or observed

## 2020-01-31 NOTE — Progress Notes (Signed)
[place] NOVEL CORONAVIRUS (COVID-19) DAILY CHECK-OFF SYMPTOMS - answer yes or no to each - every day NO YES  Have you had a fever in the past 24 hours?  . Fever (Temp > 37.80C / 100F) X   Have you had any of these symptoms in the past 24 hours? . New Cough .  Sore Throat  .  Shortness of Breath .  Difficulty Breathing .  Unexplained Body Aches   X   Have you had any one of these symptoms in the past 24 hours not related to allergies?   . Runny Nose .  Nasal Congestion .  Sneezing   X   If you have had runny nose, nasal congestion, sneezing in the past 24 hours, has it worsened?  X   EXPOSURES - check yes or no X   Have you traveled outside the state in the past 14 days?  X   Have you been in contact with someone with a confirmed diagnosis of COVID-19 or PUI in the past 14 days without wearing appropriate PPE?  X   Have you been living in the same home as a person with confirmed diagnosis of COVID-19 or a PUI (household contact)?    X   Have you been diagnosed with COVID-19?    X              What to do next: Answered NO to all: Answered YES to anything:   Proceed with unit schedule Follow the [facility] Inpatient Flowsheet.   

## 2020-01-31 NOTE — Progress Notes (Addendum)
[facility] MD Progress Note  01/31/2020 5:56 PM Adrian Alexander  MRN:  [id number] Subjective:  Patient stated he feels better today. His BP's are improved today. He appears tired and was lying down in his room after lunch today.He stated he slept last night and his appetite is better today.  He denies suicidal and homicidal ideation. He denies paranoia and hallucinations. He wants to get help with his own problems before he returns home to the situation he left. He feels that his fiance' is allowing her grown son to take over their house and he wants her to deal with the situation. He stated she is trying. The patient stated he knows he has things of his own to work on and they go to couples counseling. He just wants this grown man to move out of the house and take all of his dogs with him.  Patient is attending group and taking his medications. He has no  medication side effect complaints. He requested Gabapentin for neuropathy in his legs, stated the Mobic does not work. Chart review completed, Gabapentin 400 mg TID started.   Principal Problem: MDD (major depressive disorder), recurrent episode, severe (HCC) Diagnosis: Principal Problem:   MDD (major depressive disorder), recurrent episode, severe (HCC) Active Problems:   Substance induced mood disorder (HCC)   MDD (major depressive disorder)   Alcohol dependence with withdrawal, uncomplicated (HCC)   Post traumatic stress disorder (PTSD)   Schizoaffective disorder (HCC)   Cocaine abuse with cocaine-induced mood disorder (HCC)   Essential hypertension   Seizure (HCC)  Total Time spent with patient: 45 minutes  Past Psychiatric History: Previous hospitalization at [facility] 2013  Past Medical History:  Past Medical History:  Diagnosis Date  . Anxiety   . Bipolar affective disorder, depressed (HCC)   . Hypertension   . Schizo-affective schizophrenia (HCC)   . Schizophrenia, schizo-affective (HCC)   . Seizure (HCC)   . Seizures (HCC)    Last  month. gets a sensation    Past Surgical History:  Procedure Laterality Date  . ANKLE SURGERY    . CLOSED REDUCTION WITH HUMER PIN INSERTION     left ankle 1998  . HERNIA REPAIR     while in prison  . HERNIA REPAIR     Family History:  Family History  Problem Relation Age of Onset  . Cancer Mother 52       colon  . Heart disease Father   . Heart attack Father 51   Family Psychiatric  History: See H&P Social History:  Social History   Substance and Sexual Activity  Alcohol Use Yes  . Alcohol/week: 42.0 standard drinks  . Types: 42 Cans of beer per week     Social History   Substance and Sexual Activity  Drug Use Yes  . Types: Marijuana, Cocaine    Social History   Socioeconomic History  . Marital status: Divorced    Spouse name: Not on file  . Number of children: Not on file  . Years of education: Not on file  . Highest education level: Not on file  Occupational History  . Not on file  Tobacco Use  . Smoking status: Former Smoker    Packs/day: 0.10    Years: 20.00    Pack years: 2.00    Types: Cigarettes  . Smokeless tobacco: Current User    Types: Snuff  Substance and Sexual Activity  . Alcohol use: Yes    Alcohol/week: 42.0 standard drinks    Types:  42 Cans of beer per week  . Drug use: Yes    Types: Marijuana, Cocaine  . Sexual activity: Yes    Birth control/protection: Condom  Other Topics Concern  . Not on file  Social History Narrative   Lives in [place] alone   Social Determinants of Health   Financial Resource Strain:   . Difficulty of Paying Living Expenses: Not on file  Food Insecurity:   . Worried About Programme researcher, broadcasting/film/video in the Last Year: Not on file  . Ran Out of Food in the Last Year: Not on file  Transportation Needs:   . Lack of Transportation (Medical): Not on file  . Lack of Transportation (Non-Medical): Not on file  Physical Activity:   . Days of Exercise per Week: Not on file  . Minutes of Exercise per Session: Not on  file  Stress:   . Feeling of Stress : Not on file  Social Connections:   . Frequency of Communication with Friends and Family: Not on file  . Frequency of Social Gatherings with Friends and Family: Not on file  . Attends Religious Services: Not on file  . Active Member of Clubs or Organizations: Not on file  . Attends Banker Meetings: Not on file  . Marital Status: Not on file   Additional Social History:    Pain Medications: Pt reports using unprescribed pain medications Prescriptions: Denies abuse Over the Counter: Denies abuse History of alcohol / drug use?: Yes Longest period of sobriety (when/how long): 3 years Negative Consequences of Use: Financial, Personal relationships Withdrawal Symptoms: Agitation, Weakness, Irritability, Nausea / Vomiting, Change in blood pressure, Seizures, Cramps Onset of Seizures: "I don't know, I was young, I can't remember" Date of most recent seizure: 4 months ago Name of Substance 1: Alcohol 1 - Age of First Use: 20 1 - Amount (size/oz): 12 beers + one fifth of liquor 1 - Frequency: Daily 1 - Duration: 3 weeks this episode 1 - Last Use / Amount: 01/29/2020 Name of Substance 2: Marijuana 2 - Age of First Use: 19 2 - Amount (size/oz): 3 joints 2 - Frequency: Daily 2 - Duration: Ongoing 2 - Last Use / Amount: 01/29/2020 Name of Substance 3: cocaine 3 - Age of First Use: 27 3 - Amount (size/oz): varies 3 - Frequency: Every 1-2 months 3 - Duration: Ongoing 3 - Last Use / Amount: 2 months ago Name of Substance 4: Vicodin 4 - Age of First Use: unknown 4 - Amount (size/oz): varies 4 - Frequency: Every 3-4 months 4 - Duration: Ongoing 4 - Last Use / Amount: 01/28/2020            Sleep: Fair  Appetite:  Good  Current Medications: Current Facility-Administered Medications  Medication Dose Route Frequency Provider Last Rate Last Admin  . acetaminophen (TYLENOL) tablet 650 mg  650 mg Oral Q6H PRN Nwoko, Uchenna E, PA       . alum & mag hydroxide-simeth (MAALOX/MYLANTA) 200-200-20 MG/5ML suspension 30 mL  30 mL Oral Q4H PRN Nwoko, Uchenna E, PA      . amLODipine (NORVASC) tablet 10 mg  10 mg Oral Daily Mariel Craft, MD   10 mg at 01/31/20 0810  . cholecalciferol (VITAMIN D) tablet 2,000 Units  2,000 Units Oral Daily Mariel Craft, MD   2,000 Units at 01/31/20 0809  . cyclobenzaprine (FLEXERIL) tablet 5 mg  5 mg Oral TID PRN Mariel Craft, MD   5 mg at 01/30/20  1806  . diclofenac Sodium (VOLTAREN) 1 % topical gel 2 g  2 g Topical QID Mariel Craft, MD   2 g at 01/30/20 2124  . docusate sodium (COLACE) capsule 100 mg  100 mg Oral Daily PRN Mariel Craft, MD      . gabapentin (NEURONTIN) capsule 400 mg  400 mg Oral TID Laveda Abbe, NP   400 mg at 01/31/20 1723  . hydrochlorothiazide (HYDRODIURIL) tablet 25 mg  25 mg Oral Daily Mariel Craft, MD   25 mg at 01/31/20 0809  . hydrOXYzine (ATARAX/VISTARIL) tablet 25 mg  25 mg Oral TID PRN Nwoko, Uchenna E, PA      . hydrOXYzine (ATARAX/VISTARIL) tablet 25 mg  25 mg Oral Daily Mariel Craft, MD   25 mg at 01/31/20 1610  . magnesium hydroxide (MILK OF MAGNESIA) suspension 30 mL  30 mL Oral Daily PRN Nwoko, Uchenna E, PA      . meloxicam (MOBIC) tablet 7.5 mg  7.5 mg Oral Daily Mariel Craft, MD   7.5 mg at 01/31/20 0813  . multivitamin with minerals tablet 1 tablet  1 tablet Oral Daily Mariel Craft, MD   1 tablet at 01/31/20 9604  . naproxen (NAPROSYN) tablet 500 mg  500 mg Oral Q24H Mariel Craft, MD   500 mg at 01/30/20 2122  . pantoprazole (PROTONIX) EC tablet 80 mg  80 mg Oral BID Mariel Craft, MD   80 mg at 01/31/20 1723  . phenytoin (DILANTIN) ER capsule 100 mg  100 mg Oral Daily Mariel Craft, MD   100 mg at 01/31/20 5409  . potassium chloride (KLOR-CON) CR tablet 10 mEq  10 mEq Oral Daily Mariel Craft, MD   10 mEq at 01/31/20 0811  . prazosin (MINIPRESS) capsule 1 mg  1 mg Oral QHS Mariel Craft, MD      .  QUEtiapine (SEROQUEL) tablet 400 mg  400 mg Oral QHS Mariel Craft, MD   400 mg at 01/30/20 2121  . terbinafine (LAMISIL) tablet 250 mg  250 mg Oral Daily Mariel Craft, MD   250 mg at 01/31/20 0810  . traMADol (ULTRAM) tablet 50 mg  50 mg Oral Q6H PRN Mariel Craft, MD      . traZODone (DESYREL) tablet 50 mg  50 mg Oral QHS PRN Meta Hatchet, PA        Lab Results:  Results for orders placed or performed during the hospital encounter of 01/29/20 (from the past 48 hour(s))  Respiratory Panel by RT PCR (Flu A&B, Covid) - Nasopharyngeal Swab     Status: None   Collection Time: 01/29/20  8:38 PM   Specimen: Nasopharyngeal Swab  Result Value Ref Range   SARS Coronavirus 2 by RT PCR NEGATIVE NEGATIVE    Comment: (NOTE) SARS-CoV-2 target nucleic acids are NOT DETECTED.  The SARS-CoV-2 RNA is generally detectable in upper respiratoy specimens during the acute phase of infection. The lowest concentration of SARS-CoV-2 viral copies this assay can detect is 131 copies/mL. A negative result does not preclude SARS-Cov-2 infection and should not be used as the sole basis for treatment or other patient management decisions. A negative result may occur with  improper specimen collection/handling, submission of specimen other than nasopharyngeal swab, presence of viral mutation(s) within the areas targeted by this assay, and inadequate number of viral copies (<131 copies/mL). A negative result must be combined with clinical observations, patient history, and  epidemiological information. The expected result is Negative.  Fact Sheet for Patients:  https://www.moore.com/  Fact Sheet for Healthcare Providers:  https://www.young.biz/  This test is no t yet approved or cleared by the [place] FDA and  has been authorized for detection and/or diagnosis of SARS-CoV-2 by FDA under an Emergency Use Authorization (EUA). This EUA will remain  in effect  (meaning this test can be used) for the duration of the COVID-19 declaration under Section 564(b)(1) of the Act, 21 U.S.C. section 360bbb-3(b)(1), unless the authorization is terminated or revoked sooner.     Influenza A by PCR NEGATIVE NEGATIVE   Influenza B by PCR NEGATIVE NEGATIVE    Comment: (NOTE) The Xpert Xpress SARS-CoV-2/FLU/RSV assay is intended as an aid in  the diagnosis of influenza from Nasopharyngeal swab specimens and  should not be used as a sole basis for treatment. Nasal washings and  aspirates are unacceptable for Xpert Xpress SARS-CoV-2/FLU/RSV  testing.  Fact Sheet for Patients: https://www.moore.com/  Fact Sheet for Healthcare Providers: https://www.young.biz/  This test is not yet approved or cleared by the [place] FDA and  has been authorized for detection and/or diagnosis of SARS-CoV-2 by  FDA under an Emergency Use Authorization (EUA). This EUA will remain  in effect (meaning this test can be used) for the duration of the  Covid-19 declaration under Section 564(b)(1) of the Act, 21  U.S.C. section 360bbb-3(b)(1), unless the authorization is  terminated or revoked. Performed at [facility], [address] [place], [place] [address]   CBC with Differential     Status: Abnormal   Collection Time: 01/30/20 12:08 AM  Result Value Ref Range   WBC 4.8 4.0 - 10.5 K/uL   RBC 5.55 4.22 - 5.81 MIL/uL   Hemoglobin 14.2 13.0 - 17.0 g/dL   HCT 03.5 39 - 52 %   MCV 79.8 (L) 80.0 - 100.0 fL   MCH 25.6 (L) 26.0 - 34.0 pg   MCHC 32.1 30.0 - 36.0 g/dL   RDW 46.5 68.1 - 27.5 %   Platelets 295 150 - 400 K/uL   nRBC 0.0 0.0 - 0.2 %   Neutrophils Relative % 54 %   Neutro Abs 2.6 1.7 - 7.7 K/uL   Lymphocytes Relative 33 %   Lymphs Abs 1.6 0.7 - 4.0 K/uL   Monocytes Relative 11 %   Monocytes Absolute 0.6 0.1 - 1.0 K/uL   Eosinophils Relative 1 %   Eosinophils Absolute 0.1 0.0 - 0.5 K/uL   Basophils  Relative 1 %   Basophils Absolute 0.0 0.0 - 0.1 K/uL   Immature Granulocytes 0 %   Abs Immature Granulocytes 0.01 0.00 - 0.07 K/uL    Comment: Performed at [facility], [address], [place], [place] [address]  Comprehensive metabolic panel     Status: Abnormal   Collection Time: 01/30/20 12:08 AM  Result Value Ref Range   Sodium 135 135 - 145 mmol/L   Potassium 3.3 (L) 3.5 - 5.1 mmol/L   Chloride 100 98 - 111 mmol/L   CO2 25 22 - 32 mmol/L   Glucose, Bld 113 (H) 70 - 99 mg/dL    Comment: Glucose reference range applies only to samples taken after fasting for at least 8 hours.   BUN 14 6 - 20 mg/dL   Creatinine, Ser 7.49 0.61 - 1.24 mg/dL   Calcium 9.0 8.9 - 44.9 mg/dL   Total Protein 6.7 6.5 - 8.1 g/dL   Albumin 3.8  3.5 - 5.0 g/dL   AST 26 15 - 41 U/L   ALT 21 0 - 44 U/L   Alkaline Phosphatase 41 38 - 126 U/L   Total Bilirubin 0.9 0.3 - 1.2 mg/dL   GFR, Estimated >16 >10 mL/min    Comment: (NOTE) Calculated using the CKD-EPI Creatinine Equation (2021)    Anion gap 10 5 - 15    Comment: Performed at [facility], [address], [place], [place] [address]  Hemoglobin A1c     Status: None   Collection Time: 01/30/20  8:35 AM  Result Value Ref Range   Hgb A1c MFr Bld 5.4 4.8 - 5.6 %    Comment: (NOTE) Pre diabetes:          5.7%-6.4%  Diabetes:              >6.4%  Glycemic control for   <7.0% adults with diabetes    Mean Plasma Glucose 108.28 mg/dL    Comment: Performed at [facility], [address] [place], [place] [address]  TSH     Status: None   Collection Time: 01/30/20  8:36 AM  Result Value Ref Range   TSH 0.633 0.350 - 4.500 uIU/mL    Comment: Performed by a 3rd Generation assay with a functional sensitivity of <=0.01 uIU/mL. Performed at [facility], [address] [place], [place] [address]   Lipid panel     Status: None   Collection Time: 01/30/20  8:52 AM  Result Value Ref Range   Cholesterol 144 0 - 200 mg/dL    Triglycerides 31 <829 mg/dL   HDL 58 >56 mg/dL   Total CHOL/HDL Ratio 2.5 RATIO   VLDL 6 0 - 40 mg/dL   LDL Cholesterol 80 0 - 99 mg/dL    Comment:        Total Cholesterol/HDL:CHD Risk Coronary Heart Disease Risk Table                     Men   Women  1/2 Average Risk   3.4   3.3  Average Risk       5.0   4.4  2 X Average Risk   9.6   7.1  3 X Average Risk  23.4   11.0        Use the calculated Patient Ratio above and the CHD Risk Table to determine the patient's CHD Risk.        ATP III CLASSIFICATION (LDL):  <100     mg/dL   Optimal  213-086  mg/dL   Near or Above                    Optimal  130-159  mg/dL   Borderline  578-469  mg/dL   High  >629     mg/dL   Very High Performed at [facility], [address] [place], [place] [address]   Urine rapid drug screen (hosp performed)not at [facility]     Status: Abnormal   Collection Time: 01/30/20  8:52 AM  Result Value Ref Range   Opiates NONE DETECTED NONE DETECTED   Cocaine NONE DETECTED NONE DETECTED   Benzodiazepines NONE DETECTED NONE DETECTED   Amphetamines NONE DETECTED NONE DETECTED   Tetrahydrocannabinol POSITIVE (A) NONE DETECTED   Barbiturates NONE DETECTED NONE DETECTED    Comment: (NOTE) DRUG SCREEN FOR MEDICAL PURPOSES ONLY.  IF CONFIRMATION IS NEEDED FOR ANY PURPOSE,  NOTIFY LAB WITHIN 5 DAYS.  LOWEST DETECTABLE LIMITS FOR URINE DRUG SCREEN Drug Class                     Cutoff (ng/mL) Amphetamine and metabolites    1000 Barbiturate and metabolites    200 Benzodiazepine                 200 Tricyclics and metabolites     300 Opiates and metabolites        300 Cocaine and metabolites        300 THC                            50 Performed at [facility], [address] [place], [place] [address]   Phenytoin level, total     Status: Abnormal   Collection Time: 01/30/20  5:46 PM  Result Value Ref Range   Phenytoin Lvl <10.0 (L) 10.0 - 20.0 ug/mL    Comment:  Performed at [facility], [address] [place], [place] [address]    Blood Alcohol level:  Lab Results  Component Value Date   ETH 14 (H) 08/05/2019   ETH 98 (H) 01/24/2015    Metabolic Disorder Labs: Lab Results  Component Value Date   HGBA1C 5.4 01/30/2020   MPG 108.28 01/30/2020   MPG 105.41 03/29/2017   No results found for: PROLACTIN Lab Results  Component Value Date   CHOL 144 01/30/2020   TRIG 31 01/30/2020   HDL 58 01/30/2020   CHOLHDL 2.5 01/30/2020   VLDL 6 01/30/2020   LDLCALC 80 01/30/2020   LDLCALC 62 03/30/2017    Physical Findings: AIMS: Facial and Oral Movements Muscles of Facial Expression: None, normal Lips and Perioral Area: None, normal Jaw: None, normal Tongue: None, normal,Extremity Movements Upper (arms, wrists, hands, fingers): None, normal Lower (legs, knees, ankles, toes): None, normal, Trunk Movements Neck, shoulders, hips: None, normal, Overall Severity Severity of abnormal movements (highest score from questions above): None, normal Incapacitation due to abnormal movements: None, normal Patient's awareness of abnormal movements (rate only patient's report): No Awareness, Dental Status Current problems with teeth and/or dentures?: No Does patient usually wear dentures?: No  CIWA:  CIWA-Ar Total: 1 COWS:     Musculoskeletal: Strength & Muscle Tone: within normal limits Gait & Station: normal Patient leans: N/A  Psychiatric Specialty Exam: Physical Exam  Review of Systems  Blood pressure 123/81, pulse 60, temperature 98.2 F (36.8 C), temperature source Oral, resp. rate 18, height 6\' 1"  (1.854 m), weight 95 kg, SpO2 100 %.Body mass index is 27.63 kg/m.  General Appearance: Casual and Fairly Groomed  Eye Contact:  Good  Speech:  Clear and Coherent and Normal Rate  Volume:  Normal  Mood:  Depressed  Affect:  Congruent and Depressed  Thought Process:  Coherent, Goal Directed, Linear and Descriptions of  Associations: Intact  Orientation:  Full (Time, Place, and Person)  Thought Content:  Logical  Suicidal Thoughts:  No  Homicidal Thoughts:  No  Memory:  Immediate;   Good Recent;   Good Remote;   Good  Judgement:  Good  Insight:  Good  Psychomotor Activity:  Normal  Concentration:  Concentration: Good and Attention Span: Good  Recall:  Good  Fund of Knowledge:  Good  Language:  Good  Akathisia:  No  Handed:  Right  AIMS (if indicated):     Assets:  Communication  Skills Desire for Improvement Financial Resources/Insurance Housing Resilience Social Support Transportation Vocational/Educational  ADL's:  Intact  Cognition:  WNL  Sleep:  Number of Hours: 6.5     Treatment Plan Summary: Daily contact with patient to assess and evaluate symptoms and progress in treatment and Medication management   Problem #1  MDD (major depressive disorder), recurrent episode, severe (HCC)  -Restart Seroquel 400 mg QHS for mood  -Consider adding SSRI for depression/anxiety, will hold off until    patient stabilizes some more - Vistaril 25 mg daily for anxiety -Discontinue Paxil, patient had stopped taking prior to admission  Problem #2 Schizoaffective Bipolar Disorder -Restart  Seroquel 400 mg PO at bedtime for mood  Problem #3 PTSD -Continue Prazosin 2 mg PO at bedtime for nightmares  Problem #4  Substance Induced Mood Disorder -Provide support for recent relapse -Monitor for signs/symptoms of withdrawal -Multivitamin daily  Problem #5  Hypertension -Discontinue Catapres 0.3 mg -Continue Norvasc 10 mg PO daily   Problem #6 Seizure Disorder -Dilantin 100 mg PO daily -Phenytoin level 01/30/2020  Problem #7 Essential Hypertension -Monitor blood pressure per protocol -Norvasc 10 mg PO daily -Catapres 0.3 mg PO BID  Problem #8 Neuropathy - Legs -Gabapentin 400 mg PO TID  PRN medications:  See MAR  Laveda Abbe, NP 01/31/2020, 5:56 PM

## 2020-01-31 NOTE — [facility] Group Notes (Signed)
[facility] LCSW Group Therapy Note  01/31/2020    Type of Therapy and Topic:  Group Therapy:  A Hero Worthy of Support  Participation Level:  Active   Description of Group:  Patients in this group were introduced to the concept that additional supports including self-support are an essential part of recovery.  Matching needs with supports to help fulfill those needs was explained.  Establishing boundaries that can gradually be increased or decreased was described, with patients giving their own examples of establishing appropriate boundaries in their lives.  A song entitled "My Own Hero" was played and a group discussion ensued in which patients stated it inspired them to help themselves in order to succeed, because other people cannot achieve their goals such as sobriety or stability for them.  A song was played called "I Am Enough" which led to a discussion about being willing to believe we are worth the effort of being a self-support.   Therapeutic Goals: 1)  demonstrate the importance of being a key part of one's own support system 2)  discuss various available supports 3)  encourage patient to use music as part of their self-support and focus on goals 4)  elicit ideas from patients about supports that need to be added   Summary of Patient Progress:  The patient expressed that his fiancee, her family, his sisters, and some friends are healthy supports to him while his fiancee's son is unhealthy for him.  He also expressed that there are some family members he will not share problems with because they would use it against him.  He was much less talkative than group yesterday.  Therapeutic Modalities:   Motivational Interviewing Activity  Lynnell Chad

## 2020-02-01 DIAGNOSIS — F332 Major depressive disorder, recurrent severe without psychotic features: Secondary | ICD-10-CM

## 2020-02-01 NOTE — [facility] Group Notes (Signed)
[facility] LCSW Group Therapy  02/01/2020 2:30 PM  Type of Therapy:  ABC's of CBT and Triggers  Participation Level:  None  Participation Quality:  Drowsy and Inattentive  Affect:  Lethargic  Cognitive:  Appropriate  Insight:  Lacking  Engagement in Therapy:  Developing/Improving  Modes of Intervention:  Discussion and Education  Summary of Progress/Problems: Saamir attended group and remained there the entire time but slept throughout the entire group.  Koven did however take the handouts that were provided during group.    [employer]ung Makenah Karas 02/01/2020, 2:30 PM

## 2020-02-01 NOTE — [facility] Group Notes (Signed)
The focus of this group is to help patients establish daily goals to achieve during treatment and discuss how the patient can incorporate goal setting into their daily lives to aide in recovery.  Pt stated that he wants to get back to himself, like he was
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
## 2020-02-01 NOTE — Progress Notes (Signed)
Recreation Therapy Notes  Date:  1.22.20 Time: 0930 Location: [address] Dayroom  Group Topic: Stress Management  Goal Area(s) Addresses:  Patient will identify positive stress management techniques. Patient will identify benefits of using stress management post d/c.  Behavioral Response: Engaged  Intervention: Stress Management  Activity: Meditation.  LRT played a meditation that focused on making choices.  Patients were to listen and follow as meditation played to engage in activity.    Education:  Stress Management, Discharge Planning.   Education Outcome: Acknowledges Education  Clinical Observations/Feedback: Pt attended and participate in activity.    Adrian Alexander, LRT/CTRS        Adrian Alexander A 02/01/2020 11:28 AM 

## 2020-02-01 NOTE — Progress Notes (Signed)
D:  Patient's self inventory sheet, patient has fair sleep, no sleep medicine.  Fair appetite, normal energy level, good concentration.  Rated depression and hopeless 7, anxiety 6.  Withdrawals.  Denied SI.  Physical problems, lightheaded, pain, dizzy, rash, blurred vision.  Physical pain, received pain medicine.   A:  Medications administered per MD orders.  Emotional support and encouragement given patient. R:  Denied SI and HI, contracts for safety.  Denied A/V hallucinations.  Safety maintained with 15 minute checks.

## 2020-02-01 NOTE — Progress Notes (Signed)
Patient walked up and down hallway aapproximately three times with PT on each side.

## 2020-02-01 NOTE — Progress Notes (Signed)
Adult Psychoeducational Group Note  Date:  02/01/2020 Time:  3:25 AM  Group Topic/Focus:  Wrap-Up Group:   The focus of this group is to help patients review their daily goal of treatment and discuss progress on daily workbooks.  Participation Level:  Active  Participation Quality:  Appropriate  Affect:  Appropriate  Cognitive:  Appropriate  Insight: Appropriate  Engagement in Group:  Engaged  Modes of Intervention:  Discussion  Additional Comments:  Pt attend wrap up group. The one positive thing that happen to day he was able to move around more today. His day was a 8.  Charna Busman Long 02/01/2020, 3:25 AM

## 2020-02-01 NOTE — [facility] Group Notes (Signed)
Occupational Therapy Group Note Date: 02/01/2020 Group Topic/Focus: Stress Management  Group Description: Group encouraged increased participation and engagement through discussion focused on topic of stress management. Patients engaged interactively to discuss components of stress including physical signs, emotional signs, negative management strategies, and positive management strategies. Each individual identified one new stress management strategy they would like to try moving forward.    Therapeutic Goals: Identify current stressors Identify healthy vs unhealthy stress management strategies/techniques Discuss and identify physical and emotional signs of stress Participation Level: Active   Participation Quality: Independent   Behavior: Calm, Cooperative and Interactive   Speech/Thought Process: Focused   Affect/Mood: Euthymic   Insight: Fair   Judgement: Fair   Individualization: Adrian Alexander was active in their participation of group discussion and activity. Pt interacted appropriately with peers and offered relevant contributions to discussion. Identified "life, currently" as a current stressor for them, alongside managing their mental health. Appeared receptive to education and strategies suggested by other peers.  Modes of Intervention: Activity, Discussion and Education  Patient Response to Interventions:  Attentive, Engaged, Receptive and Interested   Plan: Continue to engage patient in OT groups 2 - 3x/week.   02/01/2020  Donne Hazel, MOT, OTR/L

## 2020-02-01 NOTE — Plan of Care (Signed)
Nurse discussed anxiety, depression and coping skills with patient.  

## 2020-02-01 NOTE — [facility] Suicide Risk Assessment (Signed)
[facility] INPATIENT:  Family/Significant Other Suicide Prevention Education  Suicide Prevention Education:  Contact Attempts: Fiancee Brenda Gregory [phone] has been identified by the patient as the family member/significant other with whom the patient will be residing, and identified as the person(s) who will aid the patient in the event of a mental health crisis. With written consent from the patient, two attempts were made to provide suicide prevention education, prior to and/or following the patient's discharge. We were unsuccessful in providing suicide prevention education. A suicide education pamphlet was given to the patient to share with family/significant other.  Date and time of first attempt: 02/01/20 2:21pm CSW was unable to leave a message due to voicemail box being full.   Date and time of second attempt: CSW will make another attempt  Adrian Alexander, LCSWA Clinicial Social Worker [facility] 

## 2020-02-01 NOTE — [facility] Suicide Risk Assessment (Deleted)
[facility] INPATIENT:  Family/Significant Other Suicide Prevention Education  Suicide Prevention Education:  Contact Attempts: Sandy Salaam [phone] has been identified by the patient as the family member/significant other with whom the patient will be residing, and identified as the person(s) who will aid the patient in the event of a mental health crisis. With written consent from the patient, two attempts were made to provide suicide prevention education, prior to and/or following the patient's discharge. We were unsuccessful in providing suicide prevention education. A suicide education pamphlet was given to the patient to share with family/significant other.  Date and time of first attempt: 02/01/20 2:21pm CSW was unable to leave a message due to voicemail box being full.   Date and time of second attempt: CSW will make another attempt  Fredirick Lathe, [employer] Clinicial Social Worker [employer]

## 2020-02-01 NOTE — Tx Team (Signed)
Interdisciplinary Treatment and Diagnostic Plan Update  02/01/2020 Time of Session: 9:55am  Adrian Alexander MRN: [id number]  Principal Diagnosis: MDD (major depressive disorder), recurrent episode, severe (HCC)  Secondary Diagnoses: Principal Problem:   MDD (major depressive disorder), recurrent episode, severe (HCC) Active Problems:   Substance induced mood disorder (HCC)   MDD (major depressive disorder)   Alcohol dependence with withdrawal, uncomplicated (HCC)   Post traumatic stress disorder (PTSD)   Schizoaffective disorder (HCC)   Cocaine abuse with cocaine-induced mood disorder (HCC)   Essential hypertension   Seizure (HCC)   Current Medications:  Current Facility-Administered Medications  Medication Dose Route Frequency Provider Last Rate Last Admin  . acetaminophen (TYLENOL) tablet 650 mg  650 mg Oral Q6H PRN Nwoko, Uchenna E, PA      . alum & mag hydroxide-simeth (MAALOX/MYLANTA) 200-200-20 MG/5ML suspension 30 mL  30 mL Oral Q4H PRN Nwoko, Uchenna E, PA      . amLODipine (NORVASC) tablet 10 mg  10 mg Oral Daily Mariel Craft, MD   10 mg at 02/01/20 0748  . cholecalciferol (VITAMIN D) tablet 2,000 Units  2,000 Units Oral Daily Mariel Craft, MD   2,000 Units at 02/01/20 0749  . cyclobenzaprine (FLEXERIL) tablet 5 mg  5 mg Oral TID PRN Mariel Craft, MD   5 mg at 01/30/20 1806  . diclofenac Sodium (VOLTAREN) 1 % topical gel 2 g  2 g Topical QID Mariel Craft, MD   2 g at 01/30/20 2124  . docusate sodium (COLACE) capsule 100 mg  100 mg Oral Daily PRN Mariel Craft, MD      . gabapentin (NEURONTIN) capsule 400 mg  400 mg Oral TID Laveda Abbe, NP   400 mg at 02/01/20 0749  . hydrochlorothiazide (HYDRODIURIL) tablet 25 mg  25 mg Oral Daily Mariel Craft, MD   25 mg at 02/01/20 0745  . hydrOXYzine (ATARAX/VISTARIL) tablet 25 mg  25 mg Oral TID PRN Karel Jarvis E, PA   25 mg at 01/31/20 2103  . hydrOXYzine (ATARAX/VISTARIL) tablet 25 mg  25 mg Oral  Daily Mariel Craft, MD   25 mg at 02/01/20 0750  . magnesium hydroxide (MILK OF MAGNESIA) suspension 30 mL  30 mL Oral Daily PRN Nwoko, Uchenna E, PA      . meloxicam (MOBIC) tablet 7.5 mg  7.5 mg Oral Daily Mariel Craft, MD   7.5 mg at 02/01/20 0751  . multivitamin with minerals tablet 1 tablet  1 tablet Oral Daily Mariel Craft, MD   1 tablet at 02/01/20 0748  . naproxen (NAPROSYN) tablet 500 mg  500 mg Oral Q24H Mariel Craft, MD   500 mg at 01/30/20 2122  . pantoprazole (PROTONIX) EC tablet 80 mg  80 mg Oral BID Mariel Craft, MD   80 mg at 02/01/20 0748  . phenytoin (DILANTIN) ER capsule 100 mg  100 mg Oral Daily Mariel Craft, MD   100 mg at 02/01/20 0745  . potassium chloride (KLOR-CON) CR tablet 10 mEq  10 mEq Oral Daily Mariel Craft, MD   10 mEq at 02/01/20 0745  . prazosin (MINIPRESS) capsule 1 mg  1 mg Oral QHS Mariel Craft, MD   1 mg at 01/31/20 2103  . QUEtiapine (SEROQUEL) tablet 400 mg  400 mg Oral QHS Mariel Craft, MD   400 mg at 01/31/20 2103  . terbinafine (LAMISIL) tablet 250 mg  250 mg Oral Daily Viviano Simas,  Orlean BradfordSheila M, MD   250 mg at 02/01/20 0749  . traMADol (ULTRAM) tablet 50 mg  50 mg Oral Q6H PRN Mariel CraftMaurer, Sheila M, MD   50 mg at 01/31/20 2103  . traZODone (DESYREL) tablet 50 mg  50 mg Oral QHS PRN Nwoko, Uchenna E, PA       PTA Medications: Medications Prior to Admission  Medication Sig Dispense Refill Last Dose  . amLODipine (NORVASC) 10 MG tablet Take 10 mg by mouth daily.     . cholecalciferol (VITAMIN D) 1000 UNITS tablet Take 2 tablets (2,000 Units total) by mouth daily. (Patient not taking: Reported on 03/29/2017) 30 tablet 0   . cloNIDine (CATAPRES) 0.3 MG tablet Take 1 tablet (0.3 mg total) by mouth 2 (two) times daily. (Patient taking differently: Take 0.3 mg by mouth 3 (three) times daily. ) 180 tablet 3   . cyclobenzaprine (FLEXERIL) 5 MG tablet Take 5 mg by mouth 3 (three) times daily.  0   . diazepam (VALIUM) 5 MG tablet Take 5 mg by  mouth 2 (two) times daily as needed for anxiety.      . diclofenac Sodium (VOLTAREN) 1 % GEL Apply 2 g topically 4 (four) times daily. 150 g 0   . docusate sodium (COLACE) 100 MG capsule Take 100 mg by mouth daily as needed for mild constipation.      . gabapentin (NEURONTIN) 400 MG capsule Take 1 capsule (400 mg total) by mouth 3 (three) times daily. 90 capsule 1   . guaiFENesin (ROBITUSSIN) 100 MG/5ML liquid Take 5-10 mLs (100-200 mg total) by mouth every 4 (four) hours as needed for cough or congestion. 150 mL 0   . hydrochlorothiazide (HYDRODIURIL) 25 MG tablet Take 1 tablet (25 mg total) by mouth daily. 30 tablet 3   . hydrOXYzine (ATARAX/VISTARIL) 25 MG tablet Take 1 tablet (25 mg total) by mouth daily. 30 tablet 1   . lisinopril (ZESTRIL) 40 MG tablet Take 1 tablet (40 mg total) by mouth daily. 30 tablet 1   . meloxicam (MOBIC) 7.5 MG tablet Take 7.5 mg by mouth daily.      . Multiple Vitamin (MULTIVITAMIN WITH MINERALS) TABS tablet Take 1 tablet by mouth daily. 30 tablet 0   . naproxen (NAPROSYN) 500 MG tablet Take 500 mg by mouth daily.      . pantoprazole (PROTONIX) 40 MG tablet Take 2 tablets (80 mg total) by mouth 2 (two) times daily. (Patient taking differently: Take 40 mg by mouth daily. ) 60 tablet 0   . PARoxetine (PAXIL) 20 MG tablet Take 20 mg by mouth daily. (Patient not taking: Reported on 01/30/2020)   Not Taking at Unknown time  . phenytoin (DILANTIN) 100 MG ER capsule Take 1 capsule (100 mg total) by mouth daily. 30 capsule 3   . potassium chloride (MICRO-K) 10 MEQ CR capsule Take 10 mEq by mouth daily.      . prazosin (MINIPRESS) 1 MG capsule Take 1 mg by mouth at bedtime.      Marland Kitchen. QUEtiapine (SEROQUEL) 400 MG tablet Take 1 tablet (400 mg total) by mouth at bedtime. 30 tablet 3   . terbinafine (LAMISIL) 250 MG tablet Take 250 mg by mouth daily.      . traMADol (ULTRAM) 50 MG tablet Take 1 tablet (50 mg total) by mouth every 6 (six) hours as needed. (Patient taking differently:  Take 50 mg by mouth every 6 (six) hours as needed for moderate pain. ) 10 tablet 0  Patient Stressors:    Patient Strengths:    Treatment Modalities: Medication Management, Group therapy, Case management,  1 to 1 session with clinician, Psychoeducation, Recreational therapy.   Physician Treatment Plan for Primary Diagnosis: MDD (major depressive disorder), recurrent episode, severe (HCC) Long Term Goal(s): Improvement in symptoms so as ready for discharge   Short Term Goals: Ability to identify changes in lifestyle to reduce recurrence of condition will improve Ability to verbalize feelings will improve Ability to disclose and discuss suicidal ideas Ability to demonstrate self-control will improve Ability to identify and develop effective coping behaviors will improve Ability to maintain clinical measurements within normal limits will improve Compliance with prescribed medications will improve Ability to identify triggers associated with substance abuse/mental health issues will improve  Medication Management: Evaluate patient's response, side effects, and tolerance of medication regimen.  Therapeutic Interventions: 1 to 1 sessions, Unit Group sessions and Medication administration.  Evaluation of Outcomes: Progressing  Physician Treatment Plan for Secondary Diagnosis: Principal Problem:   MDD (major depressive disorder), recurrent episode, severe (HCC) Active Problems:   Substance induced mood disorder (HCC)   MDD (major depressive disorder)   Alcohol dependence with withdrawal, uncomplicated (HCC)   Post traumatic stress disorder (PTSD)   Schizoaffective disorder (HCC)   Cocaine abuse with cocaine-induced mood disorder (HCC)   Essential hypertension   Seizure (HCC)  Long Term Goal(s): Improvement in symptoms so as ready for discharge   Short Term Goals: Ability to identify changes in lifestyle to reduce recurrence of condition will improve Ability to verbalize  feelings will improve Ability to disclose and discuss suicidal ideas Ability to demonstrate self-control will improve Ability to identify and develop effective coping behaviors will improve Ability to maintain clinical measurements within normal limits will improve Compliance with prescribed medications will improve Ability to identify triggers associated with substance abuse/mental health issues will improve     Medication Management: Evaluate patient's response, side effects, and tolerance of medication regimen.  Therapeutic Interventions: 1 to 1 sessions, Unit Group sessions and Medication administration.  Evaluation of Outcomes: Progressing   RN Treatment Plan for Primary Diagnosis: MDD (major depressive disorder), recurrent episode, severe (HCC) Long Term Goal(s): Knowledge of disease and therapeutic regimen to maintain health will improve  Short Term Goals: Ability to remain free from injury will improve, Ability to demonstrate self-control, Ability to participate in decision making will improve, Ability to verbalize feelings will improve, Ability to disclose and discuss suicidal ideas, Ability to identify and develop effective coping behaviors will improve and Compliance with prescribed medications will improve  Medication Management: RN will administer medications as ordered by provider, will assess and evaluate patient's response and provide education to patient for prescribed medication. RN will report any adverse and/or side effects to prescribing provider.  Therapeutic Interventions: 1 on 1 counseling sessions, Psychoeducation, Medication administration, Evaluate responses to treatment, Monitor vital signs and CBGs as ordered, Perform/monitor CIWA, COWS, AIMS and Fall Risk screenings as ordered, Perform wound care treatments as ordered.  Evaluation of Outcomes: Progressing   LCSW Treatment Plan for Primary Diagnosis: MDD (major depressive disorder), recurrent episode, severe  (HCC) Long Term Goal(s): Safe transition to appropriate next level of care at discharge, Engage patient in therapeutic group addressing interpersonal concerns.  Short Term Goals: Engage patient in aftercare planning with referrals and resources, Increase social support, Increase ability to appropriately verbalize feelings, Facilitate acceptance of mental health diagnosis and concerns, Facilitate patient progression through stages of change regarding substance use diagnoses and concerns and Identify  triggers associated with mental health/substance abuse issues  Therapeutic Interventions: Assess for all discharge needs, 1 to 1 time with Social worker, Explore available resources and support systems, Assess for adequacy in community support network, Educate family and significant other(s) on suicide prevention, Complete Psychosocial Assessment, Interpersonal group therapy.  Evaluation of Outcomes: Progressing   Progress in Treatment: Attending groups: Yes. Participating in groups: Yes. Taking medication as prescribed: Yes. Toleration medication: Yes. Family/Significant other contact made: Yes, individual(s) contacted:  [place]  Patient understands diagnosis: Yes. and No. Discussing patient identified problems/goals with staff: Yes. Medical problems stabilized or resolved: Yes. Denies suicidal/homicidal ideation: Yes. Issues/concerns per patient self-inventory: No.   New problem(s) identified: No, Describe:  None   New Short Term/Long Term Goal(s): medication stabilization, elimination of SI thoughts, development of comprehensive mental wellness plan.   Patient Goals: "to get better"   Discharge Plan or Barriers: Patient recently admitted. CSW will continue to follow and assess for appropriate referrals and possible discharge planning.   Reason for Continuation of Hospitalization: Hallucinations Medication stabilization Suicidal ideation Withdrawal symptoms  Estimated Length of Stay:  3 to 5 days   Attendees: Patient: Adrian Alexander  02/01/2020   Physician: Pricilla Larsson, MD 02/01/2020   Nursing:  02/01/2020   RN Care Manager: 02/01/2020   Social Worker: Melba Coon, LCSWA 02/01/2020   Recreational Therapist:  02/01/2020   Other:  02/01/2020   Other:  02/01/2020   Other: 02/01/2020     Scribe for Treatment Team: Aram Beecham, LCSWA 02/01/2020 10:59 AM

## 2020-02-01 NOTE — Progress Notes (Signed)
Patient ID: Adrian Alexander, male   DOB: 1961/02/24, 59 y.o.   MRN: [id number]   [facility] MD Progress Note  02/01/2020 3:23 PM Adrian Alexander  MRN:  [id number]   Daily Note: Patient seen, chart reviewed, case discussed with treatment team.  Patient is porting improvement in mood, although his mood seems to be tied to his thoughts on the recent events.  Patient continues to concentrate on the inappropriateness of his stepson living in the home with him and his stepson's inappropriate actions.  Patient reports compliance with medications without any issues.  Denies any adverse effects, none observed.  Patient is eating and sleeping well without issue.  Patient has been in good behavioral control and is calm and cooperative with staff.  He is attending groups and participating meaningfully.   Principal Problem: MDD (major depressive disorder), recurrent episode, severe (HCC) Diagnosis: Principal Problem:   MDD (major depressive disorder), recurrent episode, severe (HCC) Active Problems:   Substance induced mood disorder (HCC)   MDD (major depressive disorder)   Alcohol dependence with withdrawal, uncomplicated (HCC)   Post traumatic stress disorder (PTSD)   Schizoaffective disorder (HCC)   Cocaine abuse with cocaine-induced mood disorder (HCC)   Essential hypertension   Seizure (HCC)  Total Time spent with patient: 45 minutes  Past Psychiatric History: Previous hospitalization at [facility] 2013  Past Medical History:  Past Medical History:  Diagnosis Date  . Anxiety   . Bipolar affective disorder, depressed (HCC)   . Hypertension   . Schizo-affective schizophrenia (HCC)   . Schizophrenia, schizo-affective (HCC)   . Seizure (HCC)   . Seizures (HCC)    Last month. gets a sensation    Past Surgical History:  Procedure Laterality Date  . ANKLE SURGERY    . CLOSED REDUCTION WITH HUMER PIN INSERTION     left ankle 1998  . HERNIA REPAIR     while in prison  . HERNIA REPAIR     Family  History:  Family History  Problem Relation Age of Onset  . Cancer Mother 72       colon  . Heart disease Father   . Heart attack Father 52   Family Psychiatric  History: See H&P Social History:  Social History   Substance and Sexual Activity  Alcohol Use Yes  . Alcohol/week: 42.0 standard drinks  . Types: 42 Cans of beer per week     Social History   Substance and Sexual Activity  Drug Use Yes  . Types: Marijuana, Cocaine    Social History   Socioeconomic History  . Marital status: Divorced    Spouse name: Not on file  . Number of children: Not on file  . Years of education: Not on file  . Highest education level: Not on file  Occupational History  . Not on file  Tobacco Use  . Smoking status: Former Smoker    Packs/day: 0.10    Years: 20.00    Pack years: 2.00    Types: Cigarettes  . Smokeless tobacco: Current User    Types: Snuff  Substance and Sexual Activity  . Alcohol use: Yes    Alcohol/week: 42.0 standard drinks    Types: 42 Cans of beer per week  . Drug use: Yes    Types: Marijuana, Cocaine  . Sexual activity: Yes    Birth control/protection: Condom  Other Topics Concern  . Not on file  Social History Narrative   Lives in [place] alone   Social Determinants of Health  Financial Resource Strain:   . Difficulty of Paying Living Expenses: Not on file  Food Insecurity:   . Worried About Programme researcher, broadcasting/film/video in the Last Year: Not on file  . Ran Out of Food in the Last Year: Not on file  Transportation Needs:   . Lack of Transportation (Medical): Not on file  . Lack of Transportation (Non-Medical): Not on file  Physical Activity:   . Days of Exercise per Week: Not on file  . Minutes of Exercise per Session: Not on file  Stress:   . Feeling of Stress : Not on file  Social Connections:   . Frequency of Communication with Friends and Family: Not on file  . Frequency of Social Gatherings with Friends and Family: Not on file  . Attends Religious  Services: Not on file  . Active Member of Clubs or Organizations: Not on file  . Attends Banker Meetings: Not on file  . Marital Status: Not on file   Additional Social History:    Pain Medications: Pt reports using unprescribed pain medications Prescriptions: Denies abuse Over the Counter: Denies abuse History of alcohol / drug use?: Yes Longest period of sobriety (when/how long): 3 years Negative Consequences of Use: Financial, Personal relationships Withdrawal Symptoms: Agitation, Weakness, Irritability, Nausea / Vomiting, Change in blood pressure, Seizures, Cramps Onset of Seizures: "I don't know, I was young, I can't remember" Date of most recent seizure: 4 months ago Name of Substance 1: Alcohol 1 - Age of First Use: 20 1 - Amount (size/oz): 12 beers + one fifth of liquor 1 - Frequency: Daily 1 - Duration: 3 weeks this episode 1 - Last Use / Amount: 01/29/2020 Name of Substance 2: Marijuana 2 - Age of First Use: 19 2 - Amount (size/oz): 3 joints 2 - Frequency: Daily 2 - Duration: Ongoing 2 - Last Use / Amount: 01/29/2020 Name of Substance 3: cocaine 3 - Age of First Use: 27 3 - Amount (size/oz): varies 3 - Frequency: Every 1-2 months 3 - Duration: Ongoing 3 - Last Use / Amount: 2 months ago Name of Substance 4: Vicodin 4 - Age of First Use: unknown 4 - Amount (size/oz): varies 4 - Frequency: Every 3-4 months 4 - Duration: Ongoing 4 - Last Use / Amount: 01/28/2020            Sleep: Fair  Appetite:  Good  Current Medications: Current Facility-Administered Medications  Medication Dose Route Frequency Provider Last Rate Last Admin  . acetaminophen (TYLENOL) tablet 650 mg  650 mg Oral Q6H PRN Nwoko, Uchenna E, PA      . alum & mag hydroxide-simeth (MAALOX/MYLANTA) 200-200-20 MG/5ML suspension 30 mL  30 mL Oral Q4H PRN Nwoko, Uchenna E, PA      . amLODipine (NORVASC) tablet 10 mg  10 mg Oral Daily Mariel Craft, MD   10 mg at 02/01/20 0748  .  cholecalciferol (VITAMIN D) tablet 2,000 Units  2,000 Units Oral Daily Mariel Craft, MD   2,000 Units at 02/01/20 0749  . cyclobenzaprine (FLEXERIL) tablet 5 mg  5 mg Oral TID PRN Mariel Craft, MD   5 mg at 01/30/20 1806  . diclofenac Sodium (VOLTAREN) 1 % topical gel 2 g  2 g Topical QID Mariel Craft, MD   2 g at 01/30/20 2124  . docusate sodium (COLACE) capsule 100 mg  100 mg Oral Daily PRN Mariel Craft, MD      . gabapentin (NEURONTIN) capsule  400 mg  400 mg Oral TID Laveda AbbeParks, Laurie Britton, NP   400 mg at 02/01/20 1201  . hydrochlorothiazide (HYDRODIURIL) tablet 25 mg  25 mg Oral Daily Mariel CraftMaurer, Sheila M, MD   25 mg at 02/01/20 0745  . hydrOXYzine (ATARAX/VISTARIL) tablet 25 mg  25 mg Oral TID PRN Karel JarvisNwoko, Uchenna E, PA   25 mg at 01/31/20 2103  . hydrOXYzine (ATARAX/VISTARIL) tablet 25 mg  25 mg Oral Daily Mariel CraftMaurer, Sheila M, MD   25 mg at 02/01/20 0750  . magnesium hydroxide (MILK OF MAGNESIA) suspension 30 mL  30 mL Oral Daily PRN Nwoko, Uchenna E, PA      . meloxicam (MOBIC) tablet 7.5 mg  7.5 mg Oral Daily Mariel CraftMaurer, Sheila M, MD   7.5 mg at 02/01/20 0751  . multivitamin with minerals tablet 1 tablet  1 tablet Oral Daily Mariel CraftMaurer, Sheila M, MD   1 tablet at 02/01/20 0748  . naproxen (NAPROSYN) tablet 500 mg  500 mg Oral Q24H Mariel CraftMaurer, Sheila M, MD   500 mg at 01/30/20 2122  . pantoprazole (PROTONIX) EC tablet 80 mg  80 mg Oral BID Mariel CraftMaurer, Sheila M, MD   80 mg at 02/01/20 0748  . phenytoin (DILANTIN) ER capsule 100 mg  100 mg Oral Daily Mariel CraftMaurer, Sheila M, MD   100 mg at 02/01/20 0745  . potassium chloride (KLOR-CON) CR tablet 10 mEq  10 mEq Oral Daily Mariel CraftMaurer, Sheila M, MD   10 mEq at 02/01/20 0745  . prazosin (MINIPRESS) capsule 1 mg  1 mg Oral QHS Mariel CraftMaurer, Sheila M, MD   1 mg at 01/31/20 2103  . QUEtiapine (SEROQUEL) tablet 400 mg  400 mg Oral QHS Mariel CraftMaurer, Sheila M, MD   400 mg at 01/31/20 2103  . terbinafine (LAMISIL) tablet 250 mg  250 mg Oral Daily Mariel CraftMaurer, Sheila M, MD   250 mg at 02/01/20  0749  . traMADol (ULTRAM) tablet 50 mg  50 mg Oral Q6H PRN Mariel CraftMaurer, Sheila M, MD   50 mg at 01/31/20 2103  . traZODone (DESYREL) tablet 50 mg  50 mg Oral QHS PRN Nwoko, Uchenna E, PA        Lab Results:  Results for orders placed or performed during the hospital encounter of 01/29/20 (from the past 48 hour(s))  Phenytoin level, total     Status: Abnormal   Collection Time: 01/30/20  5:46 PM  Result Value Ref Range   Phenytoin Lvl <10.0 (L) 10.0 - 20.0 ug/mL    Comment: Performed at [facility], [address] [place], [place] [address]    Blood Alcohol level:  Lab Results  Component Value Date   ETH 14 (H) 08/05/2019   ETH 98 (H) 01/24/2015    Metabolic Disorder Labs: Lab Results  Component Value Date   HGBA1C 5.4 01/30/2020   MPG 108.28 01/30/2020   MPG 105.41 03/29/2017   No results found for: PROLACTIN Lab Results  Component Value Date   CHOL 144 01/30/2020   TRIG 31 01/30/2020   HDL 58 01/30/2020   CHOLHDL 2.5 01/30/2020   VLDL 6 01/30/2020   LDLCALC 80 01/30/2020   LDLCALC 62 03/30/2017    Physical Findings: AIMS: Facial and Oral Movements Muscles of Facial Expression: None, normal Lips and Perioral Area: None, normal Jaw: None, normal Tongue: None, normal,Extremity Movements Upper (arms, wrists, hands, fingers): None, normal Lower (legs, knees, ankles, toes): None, normal, Trunk Movements Neck, shoulders, hips: None, normal, Overall Severity Severity of abnormal movements (highest  score from questions above): None, normal Incapacitation due to abnormal movements: None, normal Patient's awareness of abnormal movements (rate only patient's report): No Awareness, Dental Status Current problems with teeth and/or dentures?: No Does patient usually wear dentures?: No  CIWA:  CIWA-Ar Total: 1 COWS:     Musculoskeletal: Strength & Muscle Tone: within normal limits Gait & Station: normal Patient leans: N/A  Psychiatric Specialty  Exam: Physical Exam  Review of Systems  Blood pressure (!) 156/87, pulse 66, temperature 97.8 F (36.6 C), temperature source Oral, resp. rate 18, height 6\' 1"  (1.854 m), weight 95 kg, SpO2 100 %.Body mass index is 27.63 kg/m.  General Appearance: Casual and Fairly Groomed  Eye Contact:  Good  Speech:  Clear and Coherent and Normal Rate  Volume:  Normal  Mood:  Depressed  Affect:  Congruent and Depressed  Thought Process:  Coherent, Goal Directed, Linear and Descriptions of Associations: Intact  Orientation:  Full (Time, Place, and Person)  Thought Content:  Logical  Suicidal Thoughts:  No  Homicidal Thoughts:  No  Memory:  Immediate;   Good Recent;   Good Remote;   Good  Judgement:  Good  Insight:  Good  Psychomotor Activity:  Normal  Concentration:  Concentration: Good and Attention Span: Good  Recall:  Good  Fund of Knowledge:  Good  Language:  Good  Akathisia:  No  Handed:  Right  AIMS (if indicated):     Assets:  Communication Skills Desire for Improvement Financial Resources/Insurance Housing Resilience Social Support Transportation Vocational/Educational  ADL's:  Intact  Cognition:  WNL  Sleep:  Number of Hours: 6.5     Treatment Plan Summary: Daily contact with patient to assess and evaluate symptoms and progress in treatment and Medication management   Problem #1  MDD (major depressive disorder), recurrent episode, severe (HCC)  -Restart Seroquel 400 mg QHS for mood  - Vistaril 25 mg daily for anxiety   Problem #2 Schizoaffective Bipolar Disorder -Restart  Seroquel 400 mg PO at bedtime for mood  Problem #3 PTSD - Prazosin 1 mg PO at bedtime for nightmares  Problem #4  Substance Induced Mood Disorder -Provide support for recent relapse -Monitor for signs/symptoms of withdrawal -Multivitamin daily  Problem #5  Hypertension -Continue Norvasc 10 mg PO daily   Problem #6 Seizure Disorder -Dilantin 100 mg PO daily -Phenytoin level  01/30/2020  Problem #7 Essential Hypertension -Monitor blood pressure per protocol -Norvasc 10 mg PO daily  Problem #8 Neuropathy - Legs -Gabapentin 400 mg PO TID  PRN medications:  See MAR  02/01/2020, MD 02/01/2020, 3:23 PM

## 2020-02-02 ENCOUNTER — Ambulatory Visit: Payer: Medicaid Other | Admitting: Physical Therapy

## 2020-02-02 MED ORDER — HYDROXYZINE HCL 25 MG PO TABS: 25.0000 mg | ORAL_TABLET | Freq: Every day | ORAL | 0 refills | Status: DC

## 2020-02-02 MED ORDER — WHITE PETROLATUM EX OINT
TOPICAL_OINTMENT | CUTANEOUS | Status: AC
  Filled 2020-02-02: qty 5

## 2020-02-02 MED ORDER — PRAZOSIN HCL 1 MG PO CAPS: 1.0000 mg | ORAL_CAPSULE | Freq: Every day | ORAL | 0 refills | Status: DC

## 2020-02-02 MED ORDER — QUETIAPINE FUMARATE 400 MG PO TABS: 400.0000 mg | ORAL_TABLET | Freq: Every day | ORAL | 0 refills | Status: AC

## 2020-02-02 NOTE — [facility] Group Notes (Addendum)
Patient did not attend morning orientation & Psycho-ed group. 

## 2020-02-02 NOTE — Progress Notes (Signed)
Physical Therapy Treatment Patient Details Name: Adrian Alexander MRN: [id number] DOB: 04-09-1960 Today's Date: 02/02/2020  Discussed these exercises briefly with patient yesterday during assessment and he wanted some pictures and theraband to perform exercises at home that he had been shown at [facility] . So made a HEP handout with pictures and descriptions for pt as well as 3 levels of theraband. Provided this to nurse to hand to patient at discharge today.   Medbridge:   Access Code: [email or web address] URL: [email or web address] Date: 02/02/2020 Prepared by: [employer]  Exercises Supine Knee and Hip Extension with Resistance - 2 x daily - 7 x weekly - 1 sets - 20 reps Seated Ankle Plantarflexion with Resistance - 2 x daily - 7 x weekly - 1 sets - 20 reps Seated Ankle Dorsiflexion AROM - 2 x daily - 7 x weekly - 1 sets - 20 reps Standing Marching - 2 x daily - 7 x weekly - 1 sets - 20 reps Heel Toe Raises with Counter Support - 2 x daily - 7 x weekly - 1 sets - 20 reps Side Stepping with Counter Support - 2 x daily - 7 x weekly - 1 sets - 10 reps  No charges, no face to face with patient due to timing and DC for patient today.          [name], PT, MPT Acute Rehabilitation Services Office: [phone] Pager: [phone] 02/03/2020  [name] 02/02/2020, 10:19 AM
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
## 2020-02-02 NOTE — Progress Notes (Signed)
  [facility] Adult Case Management Discharge Plan :  Will you be returning to the same living situation after discharge:  Yes,  with girlfriend At discharge, do you have transportation home?: Yes,  girlfriend Do you have the ability to pay for your medications: Yes,  has medicaid  Release of information consent forms completed and in the chart;  Patient's signature needed at discharge.  Patient to Follow up at:  Follow-up Information    [facility]. Go on 02/05/2020.   Specialty: Behavioral Health Why: You have a walk in appointment for therapy services on 02/05/20 at 12:00 pm. This appointment will be held in person. (This provider also offers medication management services) Contact information: [address] [place] [address] [phone]       [facility]. Go on 02/26/2020.   Why: You  have an appointment on 02/26/20 at 2:30 pm for medication management.  This appointment will be held in person.  Contact information: [address] [place], [place] [address]  Office [phone] Fax [phone]              Next level of care provider has access to [facility] Link:yes  Safety Planning and Suicide Prevention discussed: Yes,  w/ pt     Has patient been referred to the Quitline?: Patient refused referral  Patient has been referred for addiction treatment: N/A  Felizardo Hoffmann, LCSWA 02/02/2020, 10:17 AM

## 2020-02-02 NOTE — Progress Notes (Signed)
Recreation Therapy Notes  Animal-Assisted Activity (AAA) Program Checklist/Progress Notes Patient Eligibility Criteria Checklist & Daily Group note for Rec Tx Intervention  Date: 11.16.21 Time: 1430 Location: [address]   AAA/T Program Assumption of Risk Form signed by Engineer, production or Parent Legal Guardian  YES   Patient is free of allergies or sever asthma  YES  Patient reports no fear of animals YES   Patient reports no history of cruelty to animals YES  Patient understands his/her participation is voluntary  YES   Patient washes hands before animal contact  YES   Patient washes hands after animal contact  YES   Behavioral Response: Engaged  Education: Charity fundraiser, Appropriate Animal Interaction   Education Outcome: Acknowledges understanding/In group clarification offered/Needs additional education.   Clinical Observations/Feedback: Pt attended and participated in group activity.     Caroll Rancher, LRT/CTRS         Caroll Rancher A 02/02/2020 3:39 PM

## 2020-02-02 NOTE — Progress Notes (Signed)
Pt discharged to lobby. Pt was stable and appreciative at that time. All papers and prescriptions were given and valuables returned. Verbal understanding expressed. Denies SI/HI and A/VH. Pt given opportunity to express concerns and ask questions.  

## 2020-02-02 NOTE — BHH Suicide Risk Assessment (Signed)
[facility] Discharge Suicide Risk Assessment   Principal Problem: MDD (major depressive disorder), recurrent episode, severe (HCC) Discharge Diagnoses: Principal Problem:   MDD (major depressive disorder), recurrent episode, severe (HCC) Active Problems:   Substance induced mood disorder (HCC)   MDD (major depressive disorder)   Alcohol dependence with withdrawal, uncomplicated (HCC)   Post traumatic stress disorder (PTSD)   Schizoaffective disorder (HCC)   Cocaine abuse with cocaine-induced mood disorder (HCC)   Essential hypertension   Seizure (HCC)   Total Time spent with patient: 30 minutes  Musculoskeletal: Strength & Muscle Tone: within normal limits Gait & Station: normal Patient leans: N/A  Psychiatric Specialty Exam: Review of Systems  All other systems reviewed and are negative.   Blood pressure (!) 134/92, pulse 88, temperature 97.7 F (36.5 C), temperature source Oral, resp. rate 18, height 6\' 1"  (1.854 m), weight 95 kg, SpO2 100 %.Body mass index is 27.63 kg/m.  General Appearance: Casual  Eye Contact::  Good  Speech:  Clear and Coherent409  Volume:  Normal  Mood:  Euthymic  Affect:  Congruent  Thought Process:  Coherent  Orientation:  Full (Time, Place, and Person)  Thought Content:  Logical  Suicidal Thoughts:  No  Homicidal Thoughts:  No  Memory:  Recent;   Fair  Judgement:  Fair  Insight:  Fair  Psychomotor Activity:  Normal  Concentration:  Fair  Recall:  [id number] of Knowledge:Fair  Language: Fair  Akathisia:  No  Handed:  Right  AIMS (if indicated):     Assets:  Communication Skills Desire for Improvement Financial Resources/Insurance Housing Leisure Time Resilience Social Support  Sleep:  Number of Hours: 6.75  Cognition: WNL  ADL's:  Intact   Mental Status Per Nursing Assessment::   On Admission:  NA  Demographic Factors:  Age 57 or older and Unemployed  Loss Factors: Decrease in vocational status and Loss of significant  relationship  Historical Factors: Prior suicide attempts  Risk Reduction Factors:   Sense of responsibility to family, Religious beliefs about death, Living with another person, especially a relative, Positive social support and Positive therapeutic relationship  Continued Clinical Symptoms:  Previous Psychiatric Diagnoses and Treatments  Cognitive Features That Contribute To Risk:  None    Suicide Risk:  Mild:  Suicidal ideation of limited frequency, intensity, duration, and specificity.  There are no identifiable plans, no associated intent, mild dysphoria and related symptoms, good self-control (both objective and subjective assessment), few other risk factors, and identifiable protective factors, including available and accessible social support.   Follow-up Information    [facility]. Go on 02/05/2020.   Specialty: Behavioral Health Why: You have a walk in appointment for therapy services on 02/05/20 at 12:00 pm. This appointment will be held in person. (This provider also offers medication management services) Contact information: [address] [place] [phone]       [facility]. Go on 02/26/2020.   Why: You  have an appointment on 02/26/20 at 2:30 pm for medication management.  This appointment will be held in person.  Contact information: [address] [place], [place]  Office [phone] Fax [phone]              Plan Of Care/Follow-up recommendations:  Other:  Follow-up with outpatient care  [phone], MD 02/02/2020, 11:07 AM

## 2020-02-02 NOTE — BHH Suicide Risk Assessment (Signed)
BHH INPATIENT:  Family/Significant Other Suicide Prevention Education Suicide Prevention Education:  Contact Attempts:  fiancee [name] [phone] has been identified by the patient as the family member/significant other with whom the patient will be residing, and identified as the person(s) who will aid the patient in the event of a mental health crisis. With written consent from the patient, two attempts were made to provide suicide prevention education, prior to and/or following the patient's discharge. We were unsuccessful in providing suicide prevention education. A suicide education pamphlet was given to the patient to share with family/significant other.  Date and time of first attempt: 02/01/20 2:21pm CSW was unable to leave a message due to voicemail box being full.   Date and time of second attempt:02/02/20 10:10am CSW was unable to leave a message due to voicemail box being full.   Unable to reach supports, SPE pamphlet placed on chart for patient to share with supports at discharge   Fredirick Lathe, [employer] Clinicial Social Worker [employer]
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
## 2020-02-04 ENCOUNTER — Ambulatory Visit: Payer: Medicaid Other | Admitting: Physical Therapy

## 2020-02-09 ENCOUNTER — Ambulatory Visit: Payer: Medicaid Other | Admitting: Physical Therapy

## 2020-02-16 ENCOUNTER — Ambulatory Visit: Payer: Medicaid Other | Admitting: Physical Therapy

## 2020-02-18 ENCOUNTER — Ambulatory Visit: Payer: Medicaid Other | Admitting: Physical Therapy

## 2020-02-23 ENCOUNTER — Ambulatory Visit: Payer: Medicaid Other | Admitting: Physical Therapy

## 2020-02-25 ENCOUNTER — Ambulatory Visit: Payer: Medicaid Other | Admitting: Internal Medicine

## 2020-02-25 ENCOUNTER — Ambulatory Visit: Payer: Medicaid Other | Admitting: Physical Therapy

## 2020-02-25 NOTE — Progress Notes (Deleted)
Cardiology Office Note:    Date:  02/25/2020   ID:  Adrian Alexander, DOB 05-Aug-1960, MRN [id number]  PCP:  Harvest [place], MD  [facility] Cardiologist:  No primary care provider on file.  [facility] Electrophysiologist:  None   CC: *** Consulted for the evaluation of DOE and chest pain at the behest of [place], Cyndee Brightly, MD  History of Present Illness:    Adrian Alexander is a 59 y.o. male with a hx of HTN, HLD, Alcohol and Cocaine Abuse, presenting for evaluation.  Patient notes that (s)he is feeling ***.  Has had no chest pain, chest pressure, chest tightness, chest stinging ***.  Discomfort occurs with ***, worsens with ***, and improves with ***.  Patient exertion notable for *** with *** and feels no symptoms.  No shortness of breath, DOE ***.  No PND or orthopnea***.  No bendopnea***, weight gain***, leg swelling ***, or abdominal swelling***.  No syncope or near syncope ***.  Notes *** no palpitations or funny heart beats.     Patient reports prior cardiac testing including *** echo, *** stress test, *** heart catheterizations, *** cardioversion, *** ablations.  Ambulatory BP ***.   Past Medical History:  Diagnosis Date  . Anxiety   . Bipolar affective disorder, depressed (HCC)   . Hypertension   . Schizo-affective schizophrenia (HCC)   . Schizophrenia, schizo-affective (HCC)   . Seizure (HCC)   . Seizures (HCC)    Last month. gets a sensation    Past Surgical History:  Procedure Laterality Date  . ANKLE SURGERY    . CLOSED REDUCTION WITH HUMER PIN INSERTION     left ankle 1998  . HERNIA REPAIR     while in prison  . HERNIA REPAIR      Current Medications: No outpatient medications have been marked as taking for the 02/25/20 encounter (Appointment) with Christell Constant, MD.     Allergies:   Patient has no known allergies.   Social History   Socioeconomic History  . Marital status: Divorced    Spouse name: Not on file  . Number of  children: Not on file  . Years of education: Not on file  . Highest education level: Not on file  Occupational History  . Not on file  Tobacco Use  . Smoking status: Former Smoker    Packs/day: 0.10    Years: 20.00    Pack years: 2.00    Types: Cigarettes  . Smokeless tobacco: Current User    Types: Snuff  Substance and Sexual Activity  . Alcohol use: Yes    Alcohol/week: 42.0 standard drinks    Types: 42 Cans of beer per week  . Drug use: Yes    Types: Marijuana, Cocaine  . Sexual activity: Yes    Birth control/protection: Condom  Other Topics Concern  . Not on file  Social History Narrative   Lives in [place] alone   Social Determinants of Health   Financial Resource Strain: Not on file  Food Insecurity: Not on file  Transportation Needs: Not on file  Physical Activity: Not on file  Stress: Not on file  Social Connections: Not on file    Family History: The patient's ***family history includes Cancer (age of onset: 11) in his mother; Heart attack (age of onset: 68) in his father; Heart disease in his father.  ROS:   Please see the history of present illness.    *** All other systems reviewed and are negative.  EKGs/Labs/Other  Studies Reviewed:    The following studies were reviewed today: ***  EKG:  EKG is *** ordered today.  The ekg ordered today demonstrates *** 12/07/19:  Sinus Brady rate 57 with LVH and secondary repolarization  Recent Labs: 01/30/2020: ALT 21; BUN 14; Creatinine, Ser 0.96; Hemoglobin 14.2; Platelets 295; Potassium 3.3; Sodium 135; TSH 0.633  Recent Lipid Panel    Component Value Date/Time   CHOL 144 01/30/2020 0852   TRIG 31 01/30/2020 0852   HDL 58 01/30/2020 0852   CHOLHDL 2.5 01/30/2020 0852   VLDL 6 01/30/2020 0852   LDLCALC 80 01/30/2020 0852     Risk Assessment/Calculations:     The 10-year ASCVD risk score Denman George DC Montez Hageman., et al., 2013) is: 18.7%   Values used to calculate the score:     Age: 58 years     Sex: Male      Is Non-Hispanic African American: Yes     Diabetic: No     Tobacco smoker: Yes     Systolic Blood Pressure: 134 mmHg     Is BP treated: Yes     HDL Cholesterol: 58 mg/dL     Total Cholesterol: 144 mg/dL   Physical Exam:    VS:  There were no vitals taken for this visit.    Wt Readings from Last 3 Encounters:  01/29/20 209 lb 7 oz (95 kg)  08/05/19 200 lb (90.7 kg)  04/09/19 208 lb (94.3 kg)    GEN: *** Well nourished, well developed in no acute distress HEENT: Normal NECK: No JVD; No carotid bruits LYMPHATICS: No lymphadenopathy CARDIAC: ***RRR, no murmurs, rubs, gallops RESPIRATORY:  Clear to auscultation without rales, wheezing or rhonchi  ABDOMEN: Soft, non-tender, non-distended MUSCULOSKELETAL:  No edema; No deformity  SKIN: Warm and dry NEUROLOGIC:  Alert and oriented x 3 PSYCHIATRIC:  Normal affect   ASSESSMENT:    No diagnosis found. PLAN:    In order of problems listed above:  Chest Pain - The patient presents with cardiac/possibly cardiac/non-cardiac *** - EKG shows *** without evidence of accessory pathway, ventricular pacing, digoxin use, LBBB, or baseline ST changes. - ASCVD risk estimated at *** - Additional Blood Work:  Lipids ***  - ASA 81 mg QD, statin, and beta blocker therapies ***  - Sublingual nitroglycerin as need for chest pain. *** - Would recommend an echocardiogram to assess LVEF and exclude WMA.  - Would recommend CCTA to exclude obstructive CAD  - Would recommend exercise/pharmacological ***nuclear medicine stress test (NPO at midnight/hold beta blocker in AM); discussed risks, benefits, and alternatives of the diagnostic procedure including chest pain, arrhythmia, and death.  Patient amenable for testing. - if positive, discussed risks and benefits of cardiac catheterization have been discussed with the patient.  These include bleeding, infection, kidney damage, stroke, heart attack, death.  The patient understands these risks and is willing  to proceed if necessary     Shared Decision Making/Informed Consent   {Are you ordering a CV Procedure (e.g. stress test, cath, DCCV, TEE, etc)?   Press F2        :[id number]}    Medication Adjustments/Labs and Tests Ordered: Current medicines are reviewed at length with the patient today.  Concerns regarding medicines are outlined above.  No orders of the defined types were placed in this encounter.  No orders of the defined types were placed in this encounter.   There are no Patient Instructions on file for this visit.   Signed, Christell Constant,  MD  02/25/2020 12:27 PM    [facility]

## 2020-03-01 ENCOUNTER — Ambulatory Visit: Payer: Medicaid Other | Admitting: Physical Therapy

## 2020-03-02 ENCOUNTER — Encounter: Payer: Self-pay | Admitting: General Practice

## 2020-03-03 ENCOUNTER — Ambulatory Visit: Payer: Medicaid Other | Admitting: Physical Therapy

## 2020-03-08 ENCOUNTER — Ambulatory Visit: Payer: Medicaid Other | Admitting: Physical Therapy

## 2020-03-10 ENCOUNTER — Ambulatory Visit: Payer: Medicaid Other | Admitting: Physical Therapy

## 2020-03-15 ENCOUNTER — Ambulatory Visit: Payer: Medicaid Other | Admitting: Physical Therapy

## 2020-03-17 ENCOUNTER — Ambulatory Visit: Payer: Medicaid Other | Admitting: Physical Therapy

## 2020-03-22 ENCOUNTER — Ambulatory Visit: Payer: Medicaid Other | Admitting: Physical Therapy

## 2020-03-23 ENCOUNTER — Other Ambulatory Visit: Payer: Self-pay

## 2020-03-23 ENCOUNTER — Ambulatory Visit: Payer: Medicaid Other | Admitting: Physical Therapy

## 2020-03-23 VITALS — BP 169/103 | HR 70

## 2020-03-23 DIAGNOSIS — G8929 Other chronic pain: Secondary | ICD-10-CM | POA: Insufficient documentation

## 2020-03-23 DIAGNOSIS — R2689 Other abnormalities of gait and mobility: Secondary | ICD-10-CM | POA: Insufficient documentation

## 2020-03-23 DIAGNOSIS — Z9181 History of falling: Secondary | ICD-10-CM | POA: Insufficient documentation

## 2020-03-23 DIAGNOSIS — M6281 Muscle weakness (generalized): Secondary | ICD-10-CM | POA: Insufficient documentation

## 2020-03-23 DIAGNOSIS — M545 Low back pain, unspecified: Secondary | ICD-10-CM | POA: Insufficient documentation

## 2020-03-23 DIAGNOSIS — R2681 Unsteadiness on feet: Secondary | ICD-10-CM | POA: Insufficient documentation

## 2020-03-23 NOTE — Patient Instructions (Signed)
Blood Pressure Record Sheet To take your blood pressure, you will need a blood pressure machine. You can buy a blood pressure machine (blood pressure monitor) at your clinic, drug store, or online. When choosing one, consider:  An automatic monitor that has an arm cuff.  A cuff that wraps snugly around your upper arm. You should be able to fit only one finger between your arm and the cuff.  A device that stores blood pressure reading results.  Do not choose a monitor that measures your blood pressure from your wrist or finger. Follow your health care provider's instructions for how to take your blood pressure. To use this form:  Get one reading in the morning (a.m.) before you take any medicines.  Get one reading in the evening (p.m.) before supper.  Take at least 2 readings with each blood pressure check. This makes sure the results are correct. Wait 1-2 minutes between measurements.  Write down the results in the spaces on this form.  Repeat this once a week, or as told by your health care provider.  Make a follow-up appointment with your health care provider to discuss the results. Blood pressure log Date: _______________________  a.m. _____________________(1st reading) _____________________(2nd reading)  p.m. _____________________(1st reading) _____________________(2nd reading) Date: _______________________  a.m. _____________________(1st reading) _____________________(2nd reading)  p.m. _____________________(1st reading) _____________________(2nd reading) Date: _______________________  a.m. _____________________(1st reading) _____________________(2nd reading)  p.m. _____________________(1st reading) _____________________(2nd reading) Date: _______________________  a.m. _____________________(1st reading) _____________________(2nd reading)  p.m. _____________________(1st reading) _____________________(2nd reading) Date: _______________________  a.m.  _____________________(1st reading) _____________________(2nd reading)  p.m. _____________________(1st reading) _____________________(2nd reading) This information is not intended to replace advice given to you by your health care provider. Make sure you discuss any questions you have with your health care provider. Document Revised: 05/03/2017 Document Reviewed: 03/05/2017 Elsevier Patient Education  2020 Elsevier Inc.  

## 2020-03-23 NOTE — Therapy (Signed)
[facility] [address] [place], [place], [address] Phone: [phone]   Fax:  [phone]  Physical Therapy Evaluation-Arrived No Charge   Patient Details  Name: Adrian Alexander MRN: [id number] Date of Birth: June 16, 1960 Referring Provider (PT): Latanya Presser   Encounter Date: 03/23/2020   PT End of Session - 03/23/20 1212    Visit Number 1   arrived no charge   Number of Visits 1    PT Start Time 1009    PT Stop Time 1045    PT Time Calculation (min) 36 min           Past Medical History:  Diagnosis Date  . Anxiety   . Bipolar affective disorder, depressed ([place])   . Hypertension   . Schizo-affective schizophrenia ([place])   . Schizophrenia, schizo-affective ([place])   . Seizure ([place])   . Seizures ([place])    Last month. gets a sensation    Past Surgical History:  Procedure Laterality Date  . ANKLE SURGERY    . CLOSED REDUCTION WITH HUMER PIN INSERTION     left ankle 1998  . HERNIA REPAIR     while in prison  . HERNIA REPAIR      Vitals:   03/23/20 1012 03/23/20 1038  BP: (!) 155/99 (!) 169/103  Pulse: 71 70      Subjective Assessment - 03/23/20 1016    Subjective Has been doing more walking and exercise than he normally would - taking his dog out. Has been taking it easy and not moving too fast. Using a single walking pole. No falls. Has lost a little weight and using his walking pole has also been helping the back pain. Been staying away from stress.    Pertinent History bipolar disorder, HTN, schizophrenia, hx of seizures    Limitations Walking;Standing    How long can you stand comfortably? about 3 minutes    How long can you walk comfortably? reporting from the lobby to the tx room was a struggle (approx. 30-40')    Diagnostic tests MRI of lumbar spine from 04/13/19: Multilevel lumbar disc degeneration with mild lateral recess andneural foraminal stenosis as above. No high-grade stenosis.    Patient  Stated Goals wants to strengthen his lower body and not be in pain when he walks.    Currently in Pain? Yes    Pain Score 4     Pain Location Back    Pain Orientation Lower    Pain Descriptors / Indicators Throbbing;Sore    Pain Type Chronic pain    Aggravating Factors  picking something up that is over 5lbs    Pain Relieving Factors not being stressed              [facility] PT Assessment - 03/23/20 1025      Assessment   Medical Diagnosis gait abnormality/imbalance    Referring Provider (PT) Maia Petties, [place]    Onset Date/Surgical Date 02/16/20   date of referral, leg weakness started at end of 2020   Hand Dominance Right    Prior Therapy received PT here in 2020 - was put on hold due to uncontrolled BP   seen once for PT eval at end of 2021     Precautions   Precautions Fall      Balance Screen   Has the patient fallen in the past 6 months Yes    How many times? 1 or 2   going up stairs   Has the patient  had a decrease in activity level because of a fear of falling?  Yes    Is the patient reluctant to leave their home because of a fear of falling?  Yes      Home Environment   Living Environment Private residence    Living Arrangements Other (Comment)   lives with step son and his mom   Type of Home House    Home Access Level entry    Entrance Stairs-Rails None    Home Layout One level    Home Equipment [place] - single point;Other (comment)   walking stick     Prior Function   Level of Independence Independent    Vocation On disability    Leisure walking, spending time with new dog, wants to get back to the gym (try some exercise equipment)      Sensation   Light Touch Impaired Detail    Additional Comments able to detect first/more pronounced in RLE      Coordination   Gross Motor Movements are Fluid and Coordinated No    Heel Shin Test decr ability to perform B due to weakness, unable to have full ROM, incr pain in low back      ROM / Strength   AROM / PROM /  Strength Strength      Strength   Right Hip Flexion 3-/5    Left Hip Flexion 3-/5    Right Knee Flexion 4+/5    Right Knee Extension 5/5    Left Knee Flexion 4+/5    Left Knee Extension 5/5    Right Ankle Dorsiflexion 4/5    Left Ankle Dorsiflexion 4/5             Assessed pt's BP at start of session at 155/99 with pt reporting he was asymptomatic and took his BP medication this morning. Assessed it again after prolonged seated rest break/subjective before assessing any standing balance/gait and pt's BP incr to 169/103. Pt continuing to report that he is asymptomatic and he is due for his next medication dose for BP at noon (pt has had a hx at this clinic of incr BP and being unable to participate in therapy). Pt reports that he sees the cardiologist next week and is monitoring his BP 2-3x a week. Provided pt with BP log handout for pt to track BP everyday (2x a day) and to bring to cardiologist next week, pt verbalized understanding. Therapist also called pt's PCP office and made Dr. Corky Downs aware (pt reports that he sees him soon for follow up). Discussed with pt that due to pt consistently having high BP readings (that are unsafe to participate in therapy) that he needs to make sure that his BP is controlled before he continues with therapy and to call back after pt follows up with PCP/cardiologist once BP is more stable. Pt verbalized understanding.                   Patient will benefit from skilled therapeutic intervention in order to improve the following deficits and impairments:     Visit Diagnosis: Other abnormalities of gait and mobility     Problem List Patient Active Problem List   Diagnosis Date Noted  . MDD (major depressive disorder), recurrent episode, severe (HCC) 01/29/2020  . Hepatitis C antibody test positive 05/21/2019  . Mood disorder (HCC) 05/21/2019  . Gastrointestinal hemorrhage 08/15/2017  . Chest pain 03/29/2017  . Gastritis 03/29/2017   . Hematochezia 03/29/2017  . Hematemesis 03/29/2017  .  Acute low back pain 12/16/2016  . Dyspnea on exertion 10/03/2016  . Essential hypertension 10/03/2016  . External hemorrhoids 10/03/2016  . Seizure (HCC) 10/03/2016  . Alcohol dependence with withdrawal, uncomplicated (HCC) 01/25/2015  . Post traumatic stress disorder (PTSD) 01/25/2015  . Schizoaffective disorder (HCC) 01/25/2015  . Cocaine abuse with cocaine-induced mood disorder (HCC) 01/25/2015  . MDD (major depressive disorder) 01/24/2015  . Substance induced mood disorder (HCC) 09/10/2011    Drake Leach, PT, DPT  03/23/2020, 12:13 PM  [facility] [address] [place], [place], [address] Phone: [phone]   Fax:  [phone]  Name: Adrian Alexander MRN: [id number] Date of Birth: 11/21/1960

## 2020-03-24 ENCOUNTER — Ambulatory Visit: Payer: Medicaid Other | Admitting: Physical Therapy

## 2020-03-29 ENCOUNTER — Ambulatory Visit: Payer: Medicaid Other | Admitting: Physical Therapy

## 2020-03-31 ENCOUNTER — Ambulatory Visit: Payer: Medicaid Other | Admitting: Physical Therapy

## 2020-04-14 ENCOUNTER — Other Ambulatory Visit: Payer: Self-pay | Admitting: Internal Medicine

## 2020-04-14 DIAGNOSIS — I10 Essential (primary) hypertension: Secondary | ICD-10-CM

## 2020-04-18 ENCOUNTER — Telehealth: Payer: Self-pay

## 2020-04-18 NOTE — Telephone Encounter (Signed)
[facility] Patient Product/process development scientist completed.    The patient is insured through [facility] and has a $3.00 copay.  Hep-C medication will call for a PA   We will continue to follow to see if copay assistance is needed.  Clearance Coots, CPhT Specialty Pharmacy Patient [facility] Phone: [phone] Fax:  [phone]

## 2020-04-20 ENCOUNTER — Encounter: Payer: Medicaid Other | Admitting: Internal Medicine

## 2020-04-25 ENCOUNTER — Other Ambulatory Visit: Payer: Self-pay

## 2020-04-25 ENCOUNTER — Ambulatory Visit (INDEPENDENT_AMBULATORY_CARE_PROVIDER_SITE_OTHER): Payer: Medicaid Other | Admitting: Internal Medicine

## 2020-04-25 ENCOUNTER — Encounter: Payer: Self-pay | Admitting: Internal Medicine

## 2020-04-25 VITALS — BP 181/116 | HR 79 | Temp 98.0°F | Ht 73.0 in | Wt 216.0 lb

## 2020-04-25 DIAGNOSIS — R569 Unspecified convulsions: Secondary | ICD-10-CM

## 2020-04-25 DIAGNOSIS — I10 Essential (primary) hypertension: Secondary | ICD-10-CM

## 2020-04-25 DIAGNOSIS — R768 Other specified abnormal immunological findings in serum: Secondary | ICD-10-CM | POA: Diagnosis present

## 2020-04-25 NOTE — Patient Instructions (Signed)
Thank you for coming to see me today. It was a pleasure seeing you.  To Do: [facility] today.  I will update you in a couple weeks about starting medicine for hepatitis C . Follow up with me in about 6 weeks  If you have any questions or concerns, please do not hesitate to call the office at [phone].  Take Care,   Gwynn Burly, DO

## 2020-04-25 NOTE — Assessment & Plan Note (Signed)
Patient with seizure disorder and per PCP notes his last seizure was in 2016.  Stable on dilantin and gabapentin managed by his PCP.  Unfortunately there is a significant interaction between his dilantin and current HCV treatment options so this will have to be coordinated along with his PCP.

## 2020-04-25 NOTE — Progress Notes (Signed)
[facility]  Reason for Consult: Chronic hepatitis C  Referring Provider: Dr Corky Downs   HPI:    Adrian Alexander is a 60 y.o. male who presents for initial evaluation and management of chronic hepatitis C.   Past medical history as below.  Patient tested positive March 2021.  Hepatitis C-associated risk factors present are: history of intranasal drug use, blood transfusion.  Patient denies IV drug abuse.  Patient has had other studies performed.  Results: HCV Ab positive.  Patient has not had prior treatment for Hepatitis C.  Patient does not have a past history of liver disease.  Patient does not have a family history of liver disease.  Patient does not  have associated signs or symptoms related to liver disease.   Labs reviewed and confirm chronic hepatitis C with a positive viral load.    Records reviewed from Primary Care.  Patient does not have documented immunity to Hepatitis A. Patient does not have documented immunity to Hepatitis B.       Patient's Medications  New Prescriptions   No medications on file  Previous Medications   AMLODIPINE (NORVASC) 10 MG TABLET    Take 10 mg by mouth daily.   CHOLECALCIFEROL (VITAMIN D) 1000 UNITS TABLET    Take 2 tablets (2,000 Units total) by mouth daily.   CYCLOBENZAPRINE (FLEXERIL) 5 MG TABLET    Take 5 mg by mouth 3 (three) times daily.   DICLOFENAC SODIUM (VOLTAREN) 1 % GEL    Apply 2 g topically 4 (four) times daily.   DOCUSATE SODIUM (COLACE) 100 MG CAPSULE    Take 100 mg by mouth daily as needed for mild constipation.    GABAPENTIN (NEURONTIN) 400 MG CAPSULE    Take 1 capsule (400 mg total) by mouth 3 (three) times daily.   HYDROCHLOROTHIAZIDE (HYDRODIURIL) 25 MG TABLET    Take 1 tablet (25 mg total) by mouth daily.   HYDROXYZINE (ATARAX/VISTARIL) 25 MG TABLET    Take 1 tablet (25 mg total) by mouth daily.   MELOXICAM (MOBIC) 7.5 MG TABLET    Take 7.5 mg by mouth daily.    MULTIPLE VITAMIN  (MULTIVITAMIN WITH MINERALS) TABS TABLET    Take 1 tablet by mouth daily.   NAPROXEN (NAPROSYN) 500 MG TABLET    Take 500 mg by mouth daily.    PANTOPRAZOLE (PROTONIX) 40 MG TABLET    Take 2 tablets (80 mg total) by mouth 2 (two) times daily.   PHENYTOIN (DILANTIN) 100 MG ER CAPSULE    Take 1 capsule (100 mg total) by mouth daily.   POTASSIUM CHLORIDE (MICRO-K) 10 MEQ CR CAPSULE    Take 10 mEq by mouth daily.    PRAZOSIN (MINIPRESS) 1 MG CAPSULE    Take 1 capsule (1 mg total) by mouth at bedtime.   QUETIAPINE (SEROQUEL) 400 MG TABLET    Take 1 tablet (400 mg total) by mouth at bedtime.   TERBINAFINE (LAMISIL) 250 MG TABLET    Take 250 mg by mouth daily.    TRAMADOL (ULTRAM) 50 MG TABLET    Take 1 tablet (50 mg total) by mouth every 6 (six) hours as needed.  Modified Medications   No medications on file  Discontinued Medications   No medications on file      Past Medical History:  Diagnosis Date  . Anxiety   . Bipolar affective disorder, depressed (HCC)   . Hypertension   . Schizo-affective schizophrenia (  HCC)   . Schizophrenia, schizo-affective (HCC)   . Seizure (HCC)   . Seizures (HCC)    Last month. gets a sensation    Social History   Tobacco Use  . Smoking status: Former Smoker    Packs/day: 0.10    Years: 20.00    Pack years: 2.00    Types: Cigarettes  . Smokeless tobacco: Current User    Types: Snuff  Substance Use Topics  . Alcohol use: Yes    Alcohol/week: 42.0 standard drinks    Types: 42 Cans of beer per week  . Drug use: Yes    Types: Marijuana, Cocaine    Family History  Problem Relation Age of Onset  . Cancer Mother 55       colon  . Heart disease Father   . Heart attack Father 60    No Known Allergies  Review of Systems  Constitutional: Negative.   Respiratory: Negative.   Cardiovascular: Negative.   Gastrointestinal: Negative.       OBJECTIVE:    Vitals:   04/25/20 1404  BP: (!) 181/116  Pulse: 79  Temp: 98 F (36.7 C)  Weight:  216 lb (98 kg)  Height: 6\' 1"  (1.854 m)     Body mass index is 28.5 kg/m.  Physical Exam Constitutional:      General: He is not in acute distress.    Appearance: Normal appearance.  Pulmonary:     Effort: Pulmonary effort is normal. No respiratory distress.  Neurological:     General: No focal deficit present.     Mental Status: He is alert and oriented to person, place, and time.  Psychiatric:        Mood and Affect: Mood normal.        Behavior: Behavior normal.      Labs and Microbiology:   Genotype: No results found for: HCVGENOTYPE HCV viral load: No results found for: HCVQUANT Lab Results  Component Value Date   WBC 4.8 01/30/2020   HGB 14.2 01/30/2020   HCT 44.3 01/30/2020   MCV 79.8 (L) 01/30/2020   PLT 295 01/30/2020    Lab Results  Component Value Date   CREATININE 0.96 01/30/2020   BUN 14 01/30/2020   NA 135 01/30/2020   K 3.3 (L) 01/30/2020   CL 100 01/30/2020   CO2 25 01/30/2020    Lab Results  Component Value Date   ALT 21 01/30/2020   AST 26 01/30/2020   ALKPHOS 41 01/30/2020     Lab Results  Component Value Date   BILITOT 0.9 01/30/2020   ALBUMIN 3.8 01/30/2020    Labs and history reviewed and show FIB-4 score: 1.10.    Interpretation: Using a lower cutoff value of 1.45, a FIB-4 score <1.45 had a negative predictive value of 90% for advanced fibrosis (Ishak fibrosis score 4-6 which includes early bridging fibrosis to cirrhosis). In contrast, a FIB-4 >3.25 would have a 97% specificity and a positive predictive value of 65% for advanced fibrosis. In the patient cohort in which this formula was first validated, at least 70% patients had values <1.45 or >3.25. Authors argued that these individuals could potentially have avoided liver biopsy with an overall accuracy of 86%.  Imaging: None   ASSESSMENT & PLAN:    Seizure [facility]) Patient with seizure disorder and per PCP notes his last seizure was in 2016.  Stable on dilantin and gabapentin  managed by his PCP.  Unfortunately there is a significant interaction between his dilantin  and current HCV treatment options so this will have to be coordinated along with his PCP.  Hepatitis C antibody test positive New Patient with Chronic Hepatitis C genotype unknown, untreated.   I discussed with the patient the lab findings that confirm chronic hepatitis C.  I discussed the pathogenesis, transmission, prevention, risks of left untreated, and treatment options for hepatitis C. I  discussed the importance and benefits of treatment.  PLAN: -- Patient counseled on limiting acetaminophen, avoidance of alcohol. -- Transmission discussed with patient including sexual transmission, injection drug use, sharing razors and toothbrush.   -- Will need referral to gastroenterology if concern for cirrhosis -- Will prescribe appropriate medication based on genotype and coverage  -- Hepatitis A and B titers with vaccination as needed -- Pneumococcal vaccine if not previously given and evidence of cirrhosis -- Further work up to include liver staging with Fibrosis scoring and calculated FIB-4 score.  Elastography if concern for advanced fibrosis and/or cirrhosis -- Will follow up after starting medication -- Labs needed within the last 6 months:   CBC CMP PT/INR Hep A and B serologies  HIV HCV genotype HCV RNA Measure of fibrosis   Essential hypertension BP is uncontrolled on multiple class regimen as prescribed by PCP.  Appears that it is consistently uncontrolled per outpatient notes.  Patient is asymptomatic today and will defer further follow up to his primary care.    Orders Placed This Encounter  Procedures  . Hepatitis B core antibody, total  . Hepatitis B surface antibody,qualitative  . Hepatitis B surface antigen  . CBC  . Hepatitis C genotype  . Hepatitis C RNA quantitative  . Liver Fibrosis, FibroTest-ActiTest  . Protime-INR  . COMPLETE METABOLIC PANEL WITH GFR  . HIV  Antibody (routine testing w rflx)     His PCP is Dr Corky Downs at [facility] [phone]  [facility] for Infectious Disease [facility] 04/25/2020, 2:33 PM

## 2020-04-25 NOTE — Assessment & Plan Note (Signed)
BP is uncontrolled on multiple class regimen as prescribed by PCP.  Appears that it is consistently uncontrolled per outpatient notes.  Patient is asymptomatic today and will defer further follow up to his primary care.

## 2020-04-25 NOTE — Addendum Note (Signed)
Addended by: Kathlynn Grate on: 04/25/2020 02:35 PM   Modules accepted: Orders

## 2020-04-25 NOTE — Assessment & Plan Note (Signed)
New Patient with Chronic Hepatitis C genotype unknown, untreated.   I discussed with the patient the lab findings that confirm chronic hepatitis C.  I discussed the pathogenesis, transmission, prevention, risks of left untreated, and treatment options for hepatitis C. I  discussed the importance and benefits of treatment.  PLAN: -- Patient counseled on limiting acetaminophen, avoidance of alcohol. -- Transmission discussed with patient including sexual transmission, injection drug use, sharing razors and toothbrush.   -- Will need referral to gastroenterology if concern for cirrhosis -- Will prescribe appropriate medication based on genotype and coverage  -- Hepatitis A and B titers with vaccination as needed -- Pneumococcal vaccine if not previously given and evidence of cirrhosis -- Further work up to include liver staging with Fibrosis scoring and calculated FIB-4 score.  Elastography if concern for advanced fibrosis and/or cirrhosis -- Will follow up after starting medication -- Labs needed within the last 6 months:   CBC CMP PT/INR Hep A and B serologies  HIV HCV genotype HCV RNA Measure of fibrosis

## 2020-04-28 LAB — HCV RNA,QUANTITATIVE REAL TIME PCR
HCV Quantitative Log: 6.78 Log IU/mL — ABNORMAL HIGH
HCV RNA, PCR, QN: 6040000 IU/mL — ABNORMAL HIGH

## 2020-04-28 LAB — HEPATITIS C ANTIBODY
Hepatitis C Ab: REACTIVE — AB
SIGNAL TO CUT-OFF: 29.6 — ABNORMAL HIGH (ref <1.00)

## 2020-04-29 ENCOUNTER — Other Ambulatory Visit: Payer: Medicaid Other

## 2020-04-29 LAB — CBC
HCT: 43.8 % (ref 38.5–50.0)
Hemoglobin: 14.6 g/dL (ref 13.2–17.1)
MCH: 25.7 pg — ABNORMAL LOW (ref 27.0–33.0)
MCHC: 33.3 g/dL (ref 32.0–36.0)
MCV: 77.2 fL — ABNORMAL LOW (ref 80.0–100.0)
MPV: 9.6 fL (ref 7.5–12.5)
Platelets: 258 10*3/uL (ref 140–400)
RBC: 5.67 10*6/uL (ref 4.20–5.80)
RDW: 13.4 % (ref 11.0–15.0)
WBC: 4 10*3/uL (ref 3.8–10.8)

## 2020-04-29 LAB — COMPLETE METABOLIC PANEL WITH GFR
AG Ratio: 1.6 (calc) (ref 1.0–2.5)
ALT: 19 U/L (ref 9–46)
AST: 19 U/L (ref 10–35)
Albumin: 4.1 g/dL (ref 3.6–5.1)
Alkaline phosphatase (APISO): 51 U/L (ref 35–144)
BUN: 12 mg/dL (ref 7–25)
CO2: 28 mmol/L (ref 20–32)
Calcium: 9.1 mg/dL (ref 8.6–10.3)
Chloride: 104 mmol/L (ref 98–110)
Creat: 1.02 mg/dL (ref 0.70–1.33)
GFR, Est African American: 94 mL/min/{1.73_m2} (ref >=60)
GFR, Est Non African American: 81 mL/min/{1.73_m2} (ref >=60)
Globulin: 2.5 g/dL (calc) (ref 1.9–3.7)
Glucose, Bld: 115 mg/dL — ABNORMAL HIGH (ref 65–99)
Potassium: 3.9 mmol/L (ref 3.5–5.3)
Sodium: 141 mmol/L (ref 135–146)
Total Bilirubin: 0.4 mg/dL (ref 0.2–1.2)
Total Protein: 6.6 g/dL (ref 6.1–8.1)

## 2020-04-29 LAB — LIVER FIBROSIS, FIBROTEST-ACTITEST
ALT: 19 U/L (ref 9–46)
Alpha-2-Macroglobulin: 250 mg/dL (ref 106–279)
Apolipoprotein A1: 146 mg/dL (ref 94–176)
Bilirubin: 0.4 mg/dL (ref 0.2–1.2)
Fibrosis Score: 0.46
GGT: 81 U/L (ref 3–85)
Haptoglobin: 92 mg/dL (ref 43–212)
Necroinflammat ACT Score: 0.09
Reference ID: 3732073

## 2020-04-29 LAB — PROTIME-INR
INR: 1
Prothrombin Time: 10.1 s (ref 9.0–11.5)

## 2020-04-29 LAB — HEPATITIS B SURFACE ANTIBODY,QUALITATIVE: Hep B S Ab: NONREACTIVE

## 2020-04-29 LAB — HEPATITIS C GENOTYPE

## 2020-04-29 LAB — HIV ANTIBODY (ROUTINE TESTING W REFLEX): HIV 1&2 Ab, 4th Generation: NONREACTIVE

## 2020-04-29 LAB — HEPATITIS B SURFACE ANTIGEN: Hepatitis B Surface Ag: NONREACTIVE

## 2020-04-29 LAB — HEPATITIS C RNA QUANTITATIVE
HCV Quantitative Log: 6.7 log IU/mL — ABNORMAL HIGH
HCV RNA, PCR, QN: 4990000 IU/mL — ABNORMAL HIGH

## 2020-04-29 LAB — HEPATITIS B CORE ANTIBODY, TOTAL: Hep B Core Total Ab: NONREACTIVE

## 2020-05-03 ENCOUNTER — Telehealth: Payer: Self-pay

## 2020-05-03 NOTE — Telephone Encounter (Signed)
[facility] Patient Advocate Encounter   Received notification from Medicaid that prior authorization for Mavyret is required.   PA submitted on 05/03/20 Key [id number] W Status is pending    [facility] will continue to follow.   Clearance Coots, CPhT Specialty Pharmacy Patient [facility] for Infectious Disease Phone: [phone] Fax:  [phone]

## 2020-05-03 NOTE — Telephone Encounter (Signed)
error 

## 2020-05-06 ENCOUNTER — Telehealth: Payer: Self-pay | Admitting: *Deleted

## 2020-05-06 ENCOUNTER — Other Ambulatory Visit: Payer: Self-pay | Admitting: Internal Medicine

## 2020-05-06 ENCOUNTER — Telehealth: Payer: Self-pay

## 2020-05-06 NOTE — Telephone Encounter (Signed)
Message from Dr Earlene Plater: If my patient Adrian Alexander returns call I just need to confirm if he's on Dilantin. I messaged his PCP too but it interacts with hep c treatment.   Per [employer], Dilantin was never filled at their location. It was put back into his profile then transferred to [facility] [place]. It was never picked up from [facility] [place] either. RN left message for pharmacist at [facility], patient's current pharmacy, to see if he ever picked up dilantin from them. Andree Coss, RN

## 2020-05-06 NOTE — Telephone Encounter (Signed)
[facility] Patient Advocate Encounter  Prior Authorization for Mavyret has been approved.    PA# [id number] Effective dates: 05/03/20 through 07/02/20  Patients co-pay is $3.00.   The Prescription can be sent to [facility]  [facility] will continue to follow.  Clearance Coots, CPhT Specialty Pharmacy Patient [facility] for Infectious Disease Phone: [phone] Fax:  [phone]

## 2020-05-09 NOTE — Telephone Encounter (Signed)
I was able to call [facility] pharmacy and they confirmed he picked up prescription for Dilantin on 12/10 for a 90 day supply.  I tried reaching his PCP (Dr Corky Downs) to discuss him tapering off Dilantin in order to treat his HCV. Patient is approved for Mavyret but all our HCV medications have significant interactions with Dilantin.  I left a message with his office for a call back.  I also sent him a message in Epic but I'm not sure he can see Epic notes on his end.   Thanks, Greig Castilla

## 2020-05-09 NOTE — Telephone Encounter (Signed)
Confirmed PCP's fax number and routed office note and labs via EPIC. Dr Corky Downs will return your call today, either between patients in clinic or after hours. Andree Coss, RN

## 2020-05-10 ENCOUNTER — Telehealth: Payer: Self-pay | Admitting: Internal Medicine

## 2020-05-10 NOTE — Telephone Encounter (Signed)
Discussed with patient's primary care physician, Dr. Corky Downs, regarding patient's antiseizure medications.  His primary care doctor will discuss with patient regarding tapering him off phenytoin and transitioning him to Keppra since patient has been stable and without seizures since approximately 2016.  He will attempt to do this over the next 2 to 3 weeks and update me once patient is off phenytoin so that we may begin treating his hepatitis C with Mavyret that has been approved.  Patient will keep his follow-up appointment with me on March 28 in order to keep a close eye on him.  His primary care doctor did mention that patient has difficulty with medications and follow-up.   Vedia Coffer for Infectious Disease [facility] 05/10/2020, 2:38 PM

## 2020-06-13 ENCOUNTER — Ambulatory Visit: Payer: Medicaid Other | Admitting: Internal Medicine

## 2020-11-25 ENCOUNTER — Emergency Department (HOSPITAL_COMMUNITY)
Admission: EM | Admit: 2020-11-25 | Discharge: 2020-11-25 | Disposition: A | Discharge status: Home / Self Care | Payer: Medicaid Other | Attending: Emergency Medicine | Admitting: Emergency Medicine

## 2020-11-25 ENCOUNTER — Other Ambulatory Visit: Payer: Self-pay

## 2020-11-25 ENCOUNTER — Encounter (HOSPITAL_COMMUNITY): Payer: Self-pay | Admitting: Emergency Medicine

## 2020-11-25 ENCOUNTER — Emergency Department (HOSPITAL_COMMUNITY): Payer: Medicaid Other

## 2020-11-25 DIAGNOSIS — I10 Essential (primary) hypertension: Secondary | ICD-10-CM | POA: Insufficient documentation

## 2020-11-25 DIAGNOSIS — Z79899 Other long term (current) drug therapy: Secondary | ICD-10-CM | POA: Diagnosis not present

## 2020-11-25 DIAGNOSIS — W540XXA Bitten by dog, initial encounter: Secondary | ICD-10-CM | POA: Diagnosis not present

## 2020-11-25 DIAGNOSIS — Z23 Encounter for immunization: Secondary | ICD-10-CM | POA: Diagnosis not present

## 2020-11-25 DIAGNOSIS — S81852A Open bite, left lower leg, initial encounter: Secondary | ICD-10-CM | POA: Diagnosis not present

## 2020-11-25 DIAGNOSIS — Z87891 Personal history of nicotine dependence: Secondary | ICD-10-CM | POA: Insufficient documentation

## 2020-11-25 DIAGNOSIS — S81851A Open bite, right lower leg, initial encounter: Secondary | ICD-10-CM | POA: Diagnosis not present

## 2020-11-25 MED ORDER — TETANUS-DIPHTH-ACELL PERTUSSIS 5-2.5-18.5 LF-MCG/0.5 IM SUSY
0.5000 mL | PREFILLED_SYRINGE | Freq: Once | INTRAMUSCULAR | Status: AC
  Administered 2020-11-25: 0.5 mL via INTRAMUSCULAR
  Filled 2020-11-25: qty 0.5

## 2020-11-25 MED ORDER — IBUPROFEN 600 MG PO TABS: 600.0000 mg | ORAL_TABLET | Freq: Four times a day (QID) | ORAL | 0 refills | Status: DC | PRN

## 2020-11-25 MED ORDER — OXYCODONE-ACETAMINOPHEN 5-325 MG PO TABS
1.0000 | ORAL_TABLET | Freq: Once | ORAL | Status: AC
  Administered 2020-11-25: 1 via ORAL
  Filled 2020-11-25: qty 1

## 2020-11-25 MED ORDER — AMOXICILLIN-POT CLAVULANATE 875-125 MG PO TABS: 1.0000 | ORAL_TABLET | Freq: Two times a day (BID) | ORAL | 0 refills | Status: DC

## 2020-11-25 NOTE — ED Provider Notes (Signed)
[facility] EMERGENCY DEPARTMENT Provider Note   CSN: [id number] Arrival date & time: 11/25/20  1244     History Chief Complaint  Patient presents with   Animal Bite    Adrian Alexander is a 60 y.o. male.  The history is provided by the patient. No language interpreter was used.  Animal Bite Associated symptoms: no fever and no numbness    60 year old male with significant history of schizophrenia, bipolar, seizure, anxiety, hepatitis C, who presents for evaluation of dog bites.  Patient report his son has a dog and several puppies that are over at his house.  Today patient report he was mowing grass outside when he felt something bit his left leg.  He looked down and saw his son's pitbull biting on his leg.  He reported cute onset of sharp throbbing pain to the affected area with bleeding.  Pain is moderate in severity nonradiating without any associated numbness.  He believes the bite was due to him being close to the dog's puppies.  He is not up-to-date with tetanus.  He believes the dog is vaccinated for rabies.  He denies any other injury.  No specific treatment tried.   Past Medical History:  Diagnosis Date   Anxiety    Bipolar affective disorder, depressed (HCC)    Hypertension    Schizo-affective schizophrenia (HCC)    Schizophrenia, schizo-affective (HCC)    Seizure (HCC)    Seizures (HCC)    Last month. gets a sensation    Patient Active Problem List   Diagnosis Date Noted   MDD (major depressive disorder), recurrent episode, severe (HCC) 01/29/2020   Hepatitis C antibody test positive 05/21/2019   Mood disorder (HCC) 05/21/2019   Gastrointestinal hemorrhage 08/15/2017   Chest pain 03/29/2017   Gastritis 03/29/2017   Hematochezia 03/29/2017   Hematemesis 03/29/2017   Acute low back pain 12/16/2016   Dyspnea on exertion 10/03/2016   Essential hypertension 10/03/2016   External hemorrhoids 10/03/2016   Seizure (HCC) 10/03/2016   Alcohol  dependence with withdrawal, uncomplicated (HCC) 01/25/2015   Post traumatic stress disorder (PTSD) 01/25/2015   Schizoaffective disorder (HCC) 01/25/2015   Cocaine abuse with cocaine-induced mood disorder (HCC) 01/25/2015   MDD (major depressive disorder) 01/24/2015   Substance induced mood disorder (HCC) 09/10/2011    Past Surgical History:  Procedure Laterality Date   ANKLE SURGERY     CLOSED REDUCTION WITH HUMER PIN INSERTION     left ankle 1998   HERNIA REPAIR     while in prison   HERNIA REPAIR         Family History  Problem Relation Age of Onset   Cancer Mother 59       colon   Heart disease Father    Heart attack Father 71    Social History   Tobacco Use   Smoking status: Former    Packs/day: 0.10    Years: 20.00    Pack years: 2.00    Types: Cigarettes   Smokeless tobacco: Current    Types: Snuff  Substance Use Topics   Alcohol use: Yes    Alcohol/week: 42.0 standard drinks    Types: 42 Cans of beer per week   Drug use: Yes    Types: Marijuana, Cocaine    Home Medications Prior to Admission medications   Medication Sig Start Date End Date Taking? Authorizing Provider  amLODipine (NORVASC) 10 MG tablet Take 10 mg by mouth daily. 07/30/19   [provider]  cholecalciferol (VITAMIN D) 1000 UNITS tablet Take 2 tablets (2,000 Units total) by mouth daily. 01/27/15   [name], NP  cyclobenzaprine (FLEXERIL) 5 MG tablet Take 5 mg by mouth 3 (three) times daily. 03/14/17   [provider]  diclofenac Sodium (VOLTAREN) 1 % GEL Apply 2 g topically 4 (four) times daily. 04/09/19   Gailen Shelter, PA  docusate sodium (COLACE) 100 MG capsule Take 100 mg by mouth daily as needed for mild constipation.     [provider]  gabapentin (NEURONTIN) 400 MG capsule Take 1 capsule (400 mg total) by mouth 3 (three) times daily. 08/07/19 10/06/19  Joy, Shawn C, PA-C  hydrochlorothiazide (HYDRODIURIL) 25 MG tablet Take 1 tablet (25 mg total) by  mouth daily. 08/07/19   Joy, Shawn C, PA-C  hydrOXYzine (ATARAX/VISTARIL) 25 MG tablet Take 1 tablet (25 mg total) by mouth daily. 02/02/20   Aldean Baker, NP  meloxicam (MOBIC) 7.5 MG tablet Take 7.5 mg by mouth daily.  11/07/18   [provider]  Multiple Vitamin (MULTIVITAMIN WITH MINERALS) TABS tablet Take 1 tablet by mouth daily. 01/27/15   [name], NP  naproxen (NAPROSYN) 500 MG tablet Take 500 mg by mouth daily.     [provider]  pantoprazole (PROTONIX) 40 MG tablet Take 2 tablets (80 mg total) by mouth 2 (two) times daily. Patient taking differently: Take 40 mg by mouth daily. 03/30/17   Levora Dredge, MD  phenytoin (DILANTIN) 100 MG ER capsule Take 1 capsule (100 mg total) by mouth daily. 08/07/19   Joy, Shawn C, PA-C  potassium chloride (MICRO-K) 10 MEQ CR capsule Take 10 mEq by mouth daily.     [provider]  prazosin (MINIPRESS) 1 MG capsule Take 1 capsule (1 mg total) by mouth at bedtime. 02/02/20   Aldean Baker, NP  QUEtiapine (SEROQUEL) 400 MG tablet Take 1 tablet (400 mg total) by mouth at bedtime. 02/02/20   Aldean Baker, NP  terbinafine (LAMISIL) 250 MG tablet Take 250 mg by mouth daily.     [provider]  traMADol (ULTRAM) 50 MG tablet Take 1 tablet (50 mg total) by mouth every 6 (six) hours as needed. Patient taking differently: Take 50 mg by mouth every 6 (six) hours as needed for moderate pain. 04/13/19   Gwyneth Sprout, MD    Allergies    Patient has no known allergies.  Review of Systems   Review of Systems  Constitutional:  Negative for fever.  Skin:  Positive for wound.  Neurological:  Negative for numbness.   Physical Exam Updated Vital Signs BP (!) 157/107 (BP Location: Right Arm)   Pulse 65   Temp 98.9 F (37.2 C) (Oral)   Resp 18   Ht 6\' 1"  (1.854 m)   Wt 88.5 kg   SpO2 97%   BMI 25.73 kg/m   Physical Exam Vitals and nursing note reviewed.  Constitutional:      General: He is not in acute  distress.    Appearance: He is well-developed.  HENT:     Head: Atraumatic.  Eyes:     Conjunctiva/sclera: Conjunctivae normal.  Musculoskeletal:        General: Signs of injury (Left lower extremity: There are 2 puncture wounds noted to the distal leg above the ankle without any joint involvement.  It is tender to palpation without foreign body noted.  Not actively bleeding.) present.     Cervical back: Neck supple.  Comments: Right lower extremity: Small abrasions noted to the lateral aspects of the lower leg with mild swelling and tenderness to palpation.  Skin:    Findings: No rash.  Neurological:     Mental Status: He is alert.    ED Results / Procedures / Treatments   Labs (all labs ordered are listed, but only abnormal results are displayed) Labs Reviewed - No data to display  EKG None  Radiology DG Tibia/Fibula Left  Result Date: 11/25/2020 CLINICAL DATA:  Dog bite EXAM: LEFT TIBIA AND FIBULA - 2 VIEW COMPARISON:  10/11/2011 FINDINGS: There is no evidence of fracture or other focal bone lesions. Prior distal fibular ORIF with intact hardware. Atherosclerotic vascular calcifications, age advanced. Mild soft tissue swelling. No radiopaque foreign bodies are evident within the soft tissues. IMPRESSION: 1. No acute osseous abnormality. 2. Mild soft tissue swelling. No radiopaque foreign bodies. Electronically Signed   By: Duanne Guess D.O.   On: 11/25/2020 14:21    Procedures Procedures   Medications Ordered in ED Medications  oxyCODONE-acetaminophen (PERCOCET/ROXICET) 5-325 MG per tablet 1 tablet (1 tablet Oral Given 11/25/20 1530)  Tdap (BOOSTRIX) injection 0.5 mL (0.5 mLs Intramuscular Given 11/25/20 1530)    ED Course  I have reviewed the triage vital signs and the nursing notes.  Pertinent labs & imaging results that were available during my care of the patient were reviewed by me and considered in my medical decision making (see chart for details).    MDM  Rules/Calculators/A&P                           BP (!) 157/107 (BP Location: Right Arm)   Pulse 65   Temp 98.9 F (37.2 C) (Oral)   Resp 18   Ht 6\' 1"  (1.854 m)   Wt 88.5 kg   SpO2 97%   BMI 25.73 kg/m   Final Clinical Impression(s) / ED Diagnoses Final diagnoses:  Dog bite of left lower leg, initial encounter  Dog bite of right lower leg, initial encounter    Rx / DC Orders ED Discharge Orders          Ordered    ibuprofen (ADVIL) 600 MG tablet  Every 6 hours PRN        11/25/20 1606    amoxicillin-clavulanate (AUGMENTIN) 875-125 MG tablet  Every 12 hours        11/25/20 1606           3:25 PM Patient here for evaluation of dog bite to left lower extremity and abrasion to right lower extremity. This is a provoked bite. Dog is likely vaccinated for rabies as it belongs to his son.  He has several puncture wounds that would need to be irrigated and apply dressing but no deep laceration requiring suture repair.  X-ray of the left tib-fib without acute osseous abnormality.  Will update tetanus.  Pain medication given.  Plan to discharge home with Augmentin, pain medication, wound care instruction, and return precaution.   01/25/21, PA-C 11/25/20 1610    01/25/21, DO 11/25/20 2356

## 2020-11-25 NOTE — ED Provider Notes (Signed)
Emergency Medicine Provider Triage Evaluation Note  Adrian Alexander , a 60 y.o. male  was evaluated in triage.  Pt complains of dog bite to the left leg.  Unknown last tetanus.  He doesn't know whose dog it was and dog ran away after.  Happened shortly PTA.  Dog did bite right lower leg also.    Review of Systems  Positive: Lacerations in BLE, pain Negative: Head injury, syncope.    Physical Exam  BP (!) 157/107 (BP Location: Right Arm)   Pulse 65   Temp 98.9 F (37.2 C) (Oral)   Resp 18   Ht 6\' 1"  (1.854 m)   Wt 88.5 kg   SpO2 97%   BMI 25.73 kg/m  Gen:   Awake, no distress   Resp:  Normal effort  MSK:   Moves extremities without difficulty Other:  Multiple wounds on BLE, Right more than left.   Medical Decision Making  Medically screening exam initiated at 1:28 PM.  Appropriate orders placed.  Adrian Alexander was informed that the remainder of the evaluation will be completed by another provider, this initial triage assessment does not replace that evaluation, and the importance of remaining in the ED until their evaluation is complete.  Note: Portions of this report may have been transcribed using voice recognition software. Every effort was made to ensure accuracy; however, inadvertent computerized transcription errors may be present    Philipp Deputy, PA-C 11/25/20 1331    01/25/21, MD 11/25/20 [phone]

## 2020-11-25 NOTE — ED Triage Notes (Signed)
Pt here for dog bite to LLE. Pt was outside mowing the grass when a dog came up and bite his L lower leg, pt has multiple puncture wounds, pt c/o pain in leg, also has abrasion to RLE.

## 2020-11-25 NOTE — Discharge Instructions (Addendum)
You have been evaluated for dog bite of your leg.  Please cleanse wound daily with gentle soap and water and apply a dressing to protect the wound.  Take antibiotic as prescribed to decrease risk of infection.  You may take ibuprofen or Tylenol as needed for pain.  You may reach out to request animal control to monitor the dog for any signs of rabies.  If you have concerns do not hesitate to return for evaluation.

## 2021-01-31 ENCOUNTER — Emergency Department (HOSPITAL_COMMUNITY): Payer: Medicaid Other

## 2021-01-31 ENCOUNTER — Other Ambulatory Visit: Payer: Self-pay

## 2021-01-31 ENCOUNTER — Encounter (HOSPITAL_COMMUNITY): Payer: Self-pay | Admitting: Emergency Medicine

## 2021-01-31 ENCOUNTER — Emergency Department (HOSPITAL_COMMUNITY)
Admission: EM | Admit: 2021-01-31 | Discharge: 2021-02-01 | Disposition: A | Discharge status: Home / Self Care | Payer: Medicaid Other | Attending: Emergency Medicine | Admitting: Emergency Medicine

## 2021-01-31 DIAGNOSIS — N289 Disorder of kidney and ureter, unspecified: Secondary | ICD-10-CM | POA: Diagnosis not present

## 2021-01-31 DIAGNOSIS — Z87891 Personal history of nicotine dependence: Secondary | ICD-10-CM | POA: Diagnosis not present

## 2021-01-31 DIAGNOSIS — Z79899 Other long term (current) drug therapy: Secondary | ICD-10-CM | POA: Insufficient documentation

## 2021-01-31 DIAGNOSIS — M549 Dorsalgia, unspecified: Secondary | ICD-10-CM | POA: Insufficient documentation

## 2021-01-31 DIAGNOSIS — I10 Essential (primary) hypertension: Secondary | ICD-10-CM | POA: Diagnosis not present
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
## 2021-02-01 ENCOUNTER — Emergency Department (HOSPITAL_COMMUNITY): Payer: Medicaid Other

## 2021-02-01 LAB — CBC WITH DIFFERENTIAL/PLATELET
Abs Immature Granulocytes: 0.05 10*3/uL (ref 0.00–0.07)
Basophils Absolute: 0 10*3/uL (ref 0.0–0.1)
Basophils Relative: 0 %
Eosinophils Absolute: 0 10*3/uL (ref 0.0–0.5)
Eosinophils Relative: 0 %
HCT: 48.8 % (ref 39.0–52.0)
Hemoglobin: 15.6 g/dL (ref 13.0–17.0)
Immature Granulocytes: 1 %
Lymphocytes Relative: 28 %
Lymphs Abs: 1.7 10*3/uL (ref 0.7–4.0)
MCH: 25.7 pg — ABNORMAL LOW (ref 26.0–34.0)
MCHC: 32 g/dL (ref 30.0–36.0)
MCV: 80.3 fL (ref 80.0–100.0)
Monocytes Absolute: 0.6 10*3/uL (ref 0.1–1.0)
Monocytes Relative: 9 %
Neutro Abs: 3.9 10*3/uL (ref 1.7–7.7)
Neutrophils Relative %: 62 %
Platelets: 242 10*3/uL (ref 150–400)
RBC: 6.08 MIL/uL — ABNORMAL HIGH (ref 4.22–5.81)
RDW: 13.4 % (ref 11.5–15.5)
WBC: 6.3 10*3/uL (ref 4.0–10.5)
nRBC: 0 % (ref 0.0–0.2)

## 2021-02-01 LAB — COMPREHENSIVE METABOLIC PANEL
ALT: 29 U/L (ref 0–44)
AST: 34 U/L (ref 15–41)
Albumin: 4 g/dL (ref 3.5–5.0)
Alkaline Phosphatase: 46 U/L (ref 38–126)
Anion gap: 14 (ref 5–15)
BUN: 9 mg/dL (ref 6–20)
CO2: 21 mmol/L — ABNORMAL LOW (ref 22–32)
Calcium: 8.7 mg/dL — ABNORMAL LOW (ref 8.9–10.3)
Chloride: 105 mmol/L (ref 98–111)
Creatinine, Ser: 1.3 mg/dL — ABNORMAL HIGH (ref 0.61–1.24)
GFR, Estimated: >60 mL/min (ref >60)
Glucose, Bld: 106 mg/dL — ABNORMAL HIGH (ref 70–99)
Potassium: 4.2 mmol/L (ref 3.5–5.1)
Sodium: 140 mmol/L (ref 135–145)
Total Bilirubin: 0.5 mg/dL (ref 0.3–1.2)
Total Protein: 6.8 g/dL (ref 6.5–8.1)

## 2021-02-01 LAB — LIPASE, BLOOD: Lipase: 31 U/L (ref 11–51)
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
## 2021-02-01 NOTE — ED Notes (Signed)
Pt had urinated all over himself and on the floor in the lobby. RN asked pt if it was normal for him to be incontinent and pt said yes.

## 2021-02-01 NOTE — ED Notes (Signed)
Patient transported to CT 

## 2021-04-10 ENCOUNTER — Ambulatory Visit: Payer: Medicaid Other | Admitting: Podiatry

## 2021-05-10 ENCOUNTER — Encounter: Payer: Self-pay | Admitting: Podiatry

## 2021-05-10 ENCOUNTER — Ambulatory Visit (INDEPENDENT_AMBULATORY_CARE_PROVIDER_SITE_OTHER): Payer: Medicaid Other | Admitting: Podiatry

## 2021-05-10 ENCOUNTER — Other Ambulatory Visit: Payer: Self-pay

## 2021-05-10 DIAGNOSIS — M79674 Pain in right toe(s): Secondary | ICD-10-CM | POA: Diagnosis not present

## 2021-05-10 DIAGNOSIS — B351 Tinea unguium: Secondary | ICD-10-CM

## 2021-05-10 DIAGNOSIS — M79675 Pain in left toe(s): Secondary | ICD-10-CM

## 2021-05-10 NOTE — Progress Notes (Signed)
This patient presents to the office with chief complaint of long thick painful nails.  Patient says the nails are painful walking and wearing shoes.  This patient is unable to self treat.  This patient is unable to trim his nails since she is unable to reach his nails.  She presents to the office for preventative foot care services.  General Appearance  Alert, conversant and in no acute stress.  Vascular  Dorsalis pedis and posterior tibial  pulses are palpable  bilaterally.  Capillary return is within normal limits  bilaterally. Temperature is within normal limits  bilaterally.  Neurologic  Senn-Weinstein monofilament wire test within normal limits  bilaterally. Muscle power within normal limits bilaterally.  Nails Thick disfigured discolored nails with subungual debris  from hallux to fifth toes bilaterally. No evidence of bacterial infection or drainage bilaterally.  Orthopedic  No limitations of motion  feet .  No crepitus or effusions noted.  No bony pathology or digital deformities noted.  Skin  normotropic skin with no porokeratosis noted bilaterally.  No signs of infections or ulcers noted.     Onychomycosis  Nails  B/L.  Pain in right toes  Pain in left toes  Debridement of nails both feet followed trimming the nails with dremel tool.    RTC 3 months.   Olyn Landstrom DPM   

## 2021-09-08 ENCOUNTER — Ambulatory Visit: Payer: Medicaid Other | Admitting: Podiatry

## 2021-11-10 ENCOUNTER — Other Ambulatory Visit (HOSPITAL_COMMUNITY): Payer: Self-pay

## 2021-11-10 ENCOUNTER — Telehealth: Payer: Self-pay

## 2021-11-10 NOTE — Telephone Encounter (Signed)
[facility] Patient Advocate Encounter  Insurance verification completed.    The patient is insured through [place] [employer]  and has a 4.00 copay.  Medication will need a PA.  We will continue to follow to see if copay assistance is needed.  Ivoree Felmlee, CPhT [facility] Patient Advocate [facility] Phone: [phone] Fax:  [phone]  

## 2021-11-14 ENCOUNTER — Other Ambulatory Visit: Payer: Self-pay | Admitting: Internal Medicine

## 2021-11-14 ENCOUNTER — Telehealth: Payer: Self-pay

## 2021-11-14 ENCOUNTER — Other Ambulatory Visit (HOSPITAL_COMMUNITY): Payer: Self-pay | Admitting: Internal Medicine

## 2021-11-14 ENCOUNTER — Encounter: Payer: Medicaid Other | Admitting: Family

## 2021-11-14 DIAGNOSIS — N5089 Other specified disorders of the male genital organs: Secondary | ICD-10-CM

## 2021-11-14 NOTE — Telephone Encounter (Signed)
Called patient regarding missed appointment today for HEP C referral. Voicemail is full and not accepting new messages. Adrian Alexander, RMA

## 2022-04-16 ENCOUNTER — Emergency Department (HOSPITAL_COMMUNITY)
Admission: EM | Admit: 2022-04-16 | Discharge: 2022-04-17 | Discharge status: Against Medical Advice | Payer: Medicaid Other | Attending: Emergency Medicine | Admitting: Emergency Medicine

## 2022-04-16 ENCOUNTER — Emergency Department (HOSPITAL_COMMUNITY): Payer: Medicaid Other

## 2022-04-16 ENCOUNTER — Other Ambulatory Visit: Payer: Self-pay

## 2022-04-16 DIAGNOSIS — R059 Cough, unspecified: Secondary | ICD-10-CM | POA: Insufficient documentation

## 2022-04-16 DIAGNOSIS — R42 Dizziness and giddiness: Secondary | ICD-10-CM | POA: Insufficient documentation

## 2022-04-16 DIAGNOSIS — R569 Unspecified convulsions: Secondary | ICD-10-CM | POA: Insufficient documentation

## 2022-04-16 DIAGNOSIS — R531 Weakness: Secondary | ICD-10-CM | POA: Diagnosis not present

## 2022-04-16 DIAGNOSIS — R0981 Nasal congestion: Secondary | ICD-10-CM | POA: Insufficient documentation

## 2022-04-16 DIAGNOSIS — Z5321 Procedure and treatment not carried out due to patient leaving prior to being seen by health care provider: Secondary | ICD-10-CM | POA: Diagnosis not present

## 2022-04-16 NOTE — ED Triage Notes (Signed)
Patient spouse suspects seizure episode this evening , reports brief confusion this evening at home / patient unable to recall incident , Alert and oriented at arrival , he adds dizziness /lightheaded for several days .
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
## 2022-04-17 LAB — CBC WITH DIFFERENTIAL/PLATELET
Abs Immature Granulocytes: 0.04 10*3/uL (ref 0.00–0.07)
Basophils Absolute: 0 10*3/uL (ref 0.0–0.1)
Basophils Relative: 0 %
Eosinophils Absolute: 0 10*3/uL (ref 0.0–0.5)
Eosinophils Relative: 0 %
HCT: 46.3 % (ref 39.0–52.0)
Hemoglobin: 15.3 g/dL (ref 13.0–17.0)
Immature Granulocytes: 1 %
Lymphocytes Relative: 26 %
Lymphs Abs: 1.6 10*3/uL (ref 0.7–4.0)
MCH: 26.6 pg (ref 26.0–34.0)
MCHC: 33 g/dL (ref 30.0–36.0)
MCV: 80.4 fL (ref 80.0–100.0)
Monocytes Absolute: 1 10*3/uL (ref 0.1–1.0)
Monocytes Relative: 16 %
Neutro Abs: 3.6 10*3/uL (ref 1.7–7.7)
Neutrophils Relative %: 57 %
Platelets: 352 10*3/uL (ref 150–400)
RBC: 5.76 MIL/uL (ref 4.22–5.81)
RDW: 12.9 % (ref 11.5–15.5)
WBC: 6.3 10*3/uL (ref 4.0–10.5)
nRBC: 0 % (ref 0.0–0.2)

## 2022-04-17 LAB — COMPREHENSIVE METABOLIC PANEL
ALT: 40 U/L (ref 0–44)
AST: 47 U/L — ABNORMAL HIGH (ref 15–41)
Albumin: 4.2 g/dL (ref 3.5–5.0)
Alkaline Phosphatase: 46 U/L (ref 38–126)
Anion gap: 11 (ref 5–15)
BUN: 25 mg/dL — ABNORMAL HIGH (ref 6–20)
CO2: 24 mmol/L (ref 22–32)
Calcium: 9.7 mg/dL (ref 8.9–10.3)
Chloride: 100 mmol/L (ref 98–111)
Creatinine, Ser: 1.64 mg/dL — ABNORMAL HIGH (ref 0.61–1.24)
GFR, Estimated: 48 mL/min — ABNORMAL LOW (ref >60)
Glucose, Bld: 110 mg/dL — ABNORMAL HIGH (ref 70–99)
Potassium: 3.6 mmol/L (ref 3.5–5.1)
Sodium: 135 mmol/L (ref 135–145)
Total Bilirubin: 1.4 mg/dL — ABNORMAL HIGH (ref 0.3–1.2)
Total Protein: 7.6 g/dL (ref 6.5–8.1)

## 2022-04-17 NOTE — ED Notes (Signed)
Pt is leaving AMA

## 2022-04-17 NOTE — ED Notes (Signed)
Pt states he is feeling "funny" and is worried.  This tech took his vitals and all seem to be stable.  I will continue to monitor through the stay in the waiting room

## 2022-04-18 LAB — LEVETIRACETAM LEVEL: Levetiracetam Lvl: <2 ug/mL — ABNORMAL LOW (ref 10.0–40.0)

## 2022-04-18 LAB — PHENYTOIN LEVEL, FREE AND TOTAL
Phenytoin, Free: NOT DETECTED ug/mL (ref 1.0–2.0)
Phenytoin, Total: 0.9 ug/mL — ABNORMAL LOW (ref 10.0–20.0)

## 2022-05-21 ENCOUNTER — Emergency Department (HOSPITAL_COMMUNITY): Payer: Medicaid Other

## 2022-05-21 ENCOUNTER — Other Ambulatory Visit: Payer: Self-pay

## 2022-05-21 ENCOUNTER — Emergency Department (HOSPITAL_COMMUNITY)
Admission: EM | Admit: 2022-05-21 | Discharge: 2022-05-21 | Disposition: A | Discharge status: Home / Self Care | Payer: Medicaid Other | Attending: Emergency Medicine | Admitting: Emergency Medicine

## 2022-05-21 ENCOUNTER — Encounter (HOSPITAL_COMMUNITY): Payer: Self-pay

## 2022-05-21 DIAGNOSIS — Z79899 Other long term (current) drug therapy: Secondary | ICD-10-CM | POA: Diagnosis not present

## 2022-05-21 DIAGNOSIS — R202 Paresthesia of skin: Secondary | ICD-10-CM | POA: Insufficient documentation

## 2022-05-21 DIAGNOSIS — R072 Precordial pain: Secondary | ICD-10-CM

## 2022-05-21 DIAGNOSIS — R079 Chest pain, unspecified: Secondary | ICD-10-CM | POA: Diagnosis present

## 2022-05-21 DIAGNOSIS — I10 Essential (primary) hypertension: Secondary | ICD-10-CM | POA: Insufficient documentation

## 2022-05-21 LAB — CBC WITH DIFFERENTIAL/PLATELET
Abs Immature Granulocytes: 0.01 10*3/uL (ref 0.00–0.07)
Basophils Absolute: 0 10*3/uL (ref 0.0–0.1)
Basophils Relative: 1 %
Eosinophils Absolute: 0.1 10*3/uL (ref 0.0–0.5)
Eosinophils Relative: 1 %
HCT: 42.7 % (ref 39.0–52.0)
Hemoglobin: 14.2 g/dL (ref 13.0–17.0)
Immature Granulocytes: 0 %
Lymphocytes Relative: 38 %
Lymphs Abs: 1.6 10*3/uL (ref 0.7–4.0)
MCH: 26 pg (ref 26.0–34.0)
MCHC: 33.3 g/dL (ref 30.0–36.0)
MCV: 78.2 fL — ABNORMAL LOW (ref 80.0–100.0)
Monocytes Absolute: 0.7 10*3/uL (ref 0.1–1.0)
Monocytes Relative: 16 %
Neutro Abs: 1.9 10*3/uL (ref 1.7–7.7)
Neutrophils Relative %: 44 %
Platelets: 203 10*3/uL (ref 150–400)
RBC: 5.46 MIL/uL (ref 4.22–5.81)
RDW: 13.2 % (ref 11.5–15.5)
WBC: 4.2 10*3/uL (ref 4.0–10.5)
nRBC: 0 % (ref 0.0–0.2)

## 2022-05-21 LAB — BASIC METABOLIC PANEL
Anion gap: 8 (ref 5–15)
BUN: 16 mg/dL (ref 6–20)
CO2: 24 mmol/L (ref 22–32)
Calcium: 8.4 mg/dL — ABNORMAL LOW (ref 8.9–10.3)
Chloride: 106 mmol/L (ref 98–111)
Creatinine, Ser: 0.9 mg/dL (ref 0.61–1.24)
GFR, Estimated: >60 mL/min (ref >60)
Glucose, Bld: 96 mg/dL (ref 70–99)
Potassium: 3.6 mmol/L (ref 3.5–5.1)
Sodium: 138 mmol/L (ref 135–145)

## 2022-05-21 LAB — TROPONIN I (HIGH SENSITIVITY)
Troponin I (High Sensitivity): 8 ng/L (ref <18)
Troponin I (High Sensitivity): 10 ng/L (ref <18)

## 2022-05-21 NOTE — Discharge Instructions (Addendum)
Lab work imaging was reassuring, please continue with all your home medications.  Recommending follow-up with cardiology and or PCP for reassessment.  Come back to the emergency department if you develop chest pain, shortness of breath, severe abdominal pain, uncontrolled nausea, vomiting, diarrhea.

## 2022-05-21 NOTE — ED Provider Notes (Signed)
[facility] Provider Note   CSN: [id number] Arrival date & time: 05/21/22  [id number]     History  Chief Complaint  Patient presents with   Chest Pain    Adrian Alexander is a 62 y.o. male.  HPI   Medical history including bipolar, seizures, hypertension, schizophrenia presents with complaints of chest pain.  Patient states that started about 3 hours ago, happened while he was in bed, states that he had a lot on his mind and start developed some chest pain, states it was in his left chest and going to his left shoulder felt some paresthesias moving to his left arm, he states he became warm but denies become diaphoretic, no associated nausea or vomiting no lightheaded dizziness no near syncope.  States that he does feel slightly short of breath, he states that after EMS gave him medication he feels better he has no complaints.  He has no cardiac history no history of PEs or DVTs currently not on hormone therapy no recent surgeries no long immobilizations, no family history of cardiac abnormalities, not being treated for diabetes, hyperlipidemia, denies tobacco use or illicit drug use.  Patient states that he has history of anxiety and sometimes feel like this.  Reviewed patient's chart has been seen 4 years ago for similar presentation, was admitted, seen by cardiology, had a benign workup and was discharged.  Home Medications Prior to Admission medications   Medication Sig Start Date End Date Taking? Authorizing Provider  amLODipine (NORVASC) 10 MG tablet Take 1 tablet by mouth daily. 12/09/18   [provider]  amoxicillin-clavulanate (AUGMENTIN) 875-125 MG tablet Take 1 tablet by mouth every 12 (twelve) hours. 11/25/20   Domenic Moras, PA-C  cholecalciferol (VITAMIN D) 1000 UNITS tablet Take 2 tablets (2,000 Units total) by mouth daily. 01/27/15   Derrill Center, NP  cloNIDine (CATAPRES) 0.3 MG tablet Take 0.3 mg by mouth 3 (three) times daily.  04/24/21   [provider]  cyclobenzaprine (FEXMID) 7.5 MG tablet cyclobenzaprine 7.5 mg tablet  Take 1 tablet twice a day by oral route as needed for 7 days.    [provider]  cyclobenzaprine (FLEXERIL) 5 MG tablet Take 5 mg by mouth 3 (three) times daily. 03/14/17   [provider]  cycloSPORINE (RESTASIS) 0.05 % ophthalmic emulsion Restasis 0.05 % eye drops in a dropperette  Instill ONE drop IN [facility] EYE TWICE DAILY    [provider]  diclofenac Sodium (VOLTAREN) 1 % GEL Apply 2 g topically 4 (four) times daily. 04/09/19   Tedd Sias, PA  docusate sodium (COLACE) 100 MG capsule Take 100 mg by mouth daily as needed for mild constipation.     [provider]  gabapentin (NEURONTIN) 400 MG capsule Take 1 capsule (400 mg total) by mouth 3 (three) times daily. 08/07/19 10/06/19  Joy, Shawn C, PA-C  gabapentin (NEURONTIN) 600 MG tablet gabapentin 600 mg tablet  Take 1 tablet every day by oral route at bedtime for 30 days.    [provider]  hydrochlorothiazide (HYDRODIURIL) 25 MG tablet Take 1 tablet (25 mg total) by mouth daily. 08/07/19   Joy, Shawn C, PA-C  hydrOXYzine (ATARAX) 25 MG tablet Take 1 tablet by mouth 3 (three) times daily as needed. 12/09/18   [provider]  ibuprofen (ADVIL) 600 MG tablet Take 1 tablet (600 mg total) by mouth every 6 (six) hours as needed for moderate pain. 11/25/20   Domenic Moras,  PA-C  levETIRAcetam (KEPPRA) 500 MG tablet Take 500 mg by mouth 2 (two) times daily. 02/27/21   [provider]  losartan (COZAAR) 100 MG tablet losartan 100 mg tablet  Take 1 tablet every day by oral route at bedtime for 90 days.    [provider]  meloxicam (MOBIC) 7.5 MG tablet Take 7.5 mg by mouth daily.  11/07/18   [provider]  Multiple Vitamin (MULTIVITAMIN WITH MINERALS) TABS tablet Take 1 tablet by mouth daily. 01/27/15   Derrill Center, NP  naproxen (NAPROSYN) 500 MG tablet Take 500 mg  by mouth daily.     [provider]  pantoprazole (PROTONIX) 40 MG tablet Take 2 tablets (80 mg total) by mouth 2 (two) times daily. Patient taking differently: Take 40 mg by mouth daily. 03/30/17   Ina Homes, MD  phenytoin (DILANTIN) 100 MG ER capsule Take 1 capsule (100 mg total) by mouth daily. 08/07/19   Joy, Shawn C, PA-C  potassium chloride (MICRO-K) 10 MEQ CR capsule Take 10 mEq by mouth daily.     [provider]  prazosin (MINIPRESS) 1 MG capsule Take 1 capsule (1 mg total) by mouth at bedtime. 02/02/20   Connye Burkitt, NP  QUEtiapine (SEROQUEL) 400 MG tablet Take 1 tablet (400 mg total) by mouth at bedtime. 02/02/20   Connye Burkitt, NP  spironolactone (ALDACTONE) 50 MG tablet Take 50 mg by mouth daily. 05/01/21   [provider]  telmisartan (MICARDIS) 80 MG tablet telmisartan 80 mg tablet  Take 1 tablet every day by oral route for 90 days.    [provider]  terbinafine (LAMISIL) 250 MG tablet Take 250 mg by mouth daily.     [provider]  traMADol (ULTRAM) 50 MG tablet Take 1 tablet (50 mg total) by mouth every 6 (six) hours as needed. Patient taking differently: Take 50 mg by mouth every 6 (six) hours as needed for moderate pain. 04/13/19   Blanchie Dessert, MD      Allergies    Patient has no known allergies.    Review of Systems   Review of Systems  Constitutional:  Negative for chills and fever.  Respiratory:  Negative for shortness of breath.   Cardiovascular:  Negative for chest pain.  Gastrointestinal:  Negative for abdominal pain.  Neurological:  Negative for headaches.    Physical Exam Updated Vital Signs BP (!) 167/105   Pulse (!) 58   Temp 97.8 F (36.6 C) (Oral)   Resp 15   Ht '6\' 1"'$  (1.854 m)   Wt 93 kg   SpO2 98%   BMI 27.05 kg/m  Physical Exam Vitals and nursing note reviewed.  Constitutional:      General: He is not in acute distress.    Appearance: He is not ill-appearing.  HENT:     Head:  Normocephalic and atraumatic.     Nose: No congestion.  Eyes:     Conjunctiva/sclera: Conjunctivae normal.  Cardiovascular:     Rate and Rhythm: Normal rate and regular rhythm.     Pulses: Normal pulses.     Heart sounds: No murmur heard.    No friction rub. No gallop.  Pulmonary:     Effort: No respiratory distress.     Breath sounds: No wheezing, rhonchi or rales.  Musculoskeletal:     Right lower leg: No edema.     Left lower leg: No edema.     Comments: No unilateral leg swelling no  calf tenderness no palpable cords.  Skin:    General: Skin is warm and dry.  Neurological:     Mental Status: He is alert.  Psychiatric:        Mood and Affect: Mood normal.     ED Results / Procedures / Treatments   Labs (all labs ordered are listed, but only abnormal results are displayed) Labs Reviewed  CBC WITH DIFFERENTIAL/PLATELET - Abnormal; Notable for the following components:      Result Value   MCV 78.2 (*)    All other components within normal limits  BASIC METABOLIC PANEL - Abnormal; Notable for the following components:   Calcium 8.4 (*)    All other components within normal limits  TROPONIN I (HIGH SENSITIVITY)  TROPONIN I (HIGH SENSITIVITY)    EKG None  Radiology DG Chest 2 View  Result Date: 05/21/2022 CLINICAL DATA:  Chest pain EXAM: CHEST - 2 VIEW COMPARISON:  01/28/2020 FINDINGS: Cardiac shadow is enlarged but stable. Aortic calcifications are seen. The lungs are well aerated bilaterally. No focal infiltrate or effusion is seen. No bony abnormality is noted. IMPRESSION: No active cardiopulmonary disease. Electronically Signed   By: Inez Catalina M.D.   On: 05/21/2022 03:56    Procedures Procedures    Medications Ordered in ED Medications - No data to display  ED Course/ Medical Decision Making/ A&P                             Medical Decision Making Amount and/or Complexity of Data Reviewed Labs: ordered. Radiology: ordered.   This patient presents to  the ED for concern of chest pain, this involves an extensive number of treatment options, and is a complaint that carries with it a high risk of complications and morbidity.  The differential diagnosis includes ACS, PE, dissection, pneumonia    Additional history obtained:  Additional history obtained from N/A External records from outside source obtained and reviewed including recent ER notes, cardiology notes   Co morbidities that complicate the patient evaluation  Hypertension, psychiatric disorder  Social Determinants of Health:  N/A    Lab Tests:  I Ordered, and personally interpreted labs.  The pertinent results include: CBC is unremarkable, BMP negative, negative delta trop   Imaging Studies ordered:  I ordered imaging studies including chest x-ray I independently visualized and interpreted imaging which showed negative for acute finding.  I agree with the radiologist interpretation   Cardiac Monitoring:  The patient was maintained on a cardiac monitor.  I personally viewed and interpreted the cardiac monitored which showed an underlying rhythm of: Negative for signs of ischemia   Medicines ordered and prescription drug management:  I ordered medication including N/A I have reviewed the patients home medicines and have made adjustments as needed  Critical Interventions:  N/a   Reevaluation:  Chest pain will obtain chest pain workup and reassess.  Reassessed resting comfortably has no complaints agreement with discharge at this time.   Consultations Obtained: N/a    Test Considered:  N/a    Rule out I have low suspicion for ACS as history is atypical, patient has no cardiac history, EKG was sinus rhythm without signs of ischemia, patient had negative delta troponin.  Low suspicion for PE as patient denies pleuritic chest pain, shortness of breath, patient denies leg pain, no pedal edema noted on exam, ultrasound is reassuring nontachypneic  nonhypoxic nontachycardic.  Low suspicion for AAA or aortic dissection  as history is atypical, patient has low risk factors.  Low suspicion for systemic infection as patient is nontoxic-appearing, vital signs reassuring, no obvious source infection noted on exam.      Dispostion and problem list  After consideration of the diagnostic results and the patients response to treatment, I feel that the patent would benefit from discharge.  Precordial chest pain-suspect this is secondary due to anxiety, but due to his age and history of hypertension, would recommend that he follows up with cardiology for further evaluation and strict return precautions.            Final Clinical Impression(s) / ED Diagnoses Final diagnoses:  Precordial pain    Rx / DC Orders ED Discharge Orders          Ordered    Ambulatory referral to Cardiology       Comments: If you have not heard from the Cardiology office within the next 72 hours please call [phone].   05/21/22 0647              Marcello Fennel, PA-C 05/21/22 [id number]    Orpah Greek, MD 05/21/22 [phone]

## 2022-05-21 NOTE — ED Triage Notes (Signed)
Patient BIB [facility] from home with complaint of chest pain waking him up from sleep. Patient reports pain radiated into left arm, with SOB & diaphoretic.   0.4 nitro SL 324 mg ASA CBG 131

## 2022-06-28 ENCOUNTER — Observation Stay (HOSPITAL_COMMUNITY)
Admission: EM | Admit: 2022-06-28 | Discharge: 2022-06-30 | Disposition: A | Discharge status: Home / Self Care | Payer: Medicaid Other | Attending: Family Medicine | Admitting: Family Medicine

## 2022-06-28 ENCOUNTER — Encounter (HOSPITAL_COMMUNITY): Payer: Self-pay

## 2022-06-28 DIAGNOSIS — E876 Hypokalemia: Secondary | ICD-10-CM | POA: Insufficient documentation

## 2022-06-28 DIAGNOSIS — F259 Schizoaffective disorder, unspecified: Secondary | ICD-10-CM | POA: Diagnosis present

## 2022-06-28 DIAGNOSIS — Z79899 Other long term (current) drug therapy: Secondary | ICD-10-CM | POA: Insufficient documentation

## 2022-06-28 DIAGNOSIS — G40909 Epilepsy, unspecified, not intractable, without status epilepticus: Secondary | ICD-10-CM | POA: Insufficient documentation

## 2022-06-28 DIAGNOSIS — G629 Polyneuropathy, unspecified: Secondary | ICD-10-CM | POA: Insufficient documentation

## 2022-06-28 DIAGNOSIS — F1210 Cannabis abuse, uncomplicated: Secondary | ICD-10-CM | POA: Diagnosis present

## 2022-06-28 DIAGNOSIS — I160 Hypertensive urgency: Secondary | ICD-10-CM | POA: Diagnosis not present

## 2022-06-28 DIAGNOSIS — Z87891 Personal history of nicotine dependence: Secondary | ICD-10-CM | POA: Diagnosis not present

## 2022-06-28 DIAGNOSIS — F1414 Cocaine abuse with cocaine-induced mood disorder: Secondary | ICD-10-CM | POA: Diagnosis present

## 2022-06-28 DIAGNOSIS — I10 Essential (primary) hypertension: Secondary | ICD-10-CM | POA: Diagnosis not present

## 2022-06-28 DIAGNOSIS — R7989 Other specified abnormal findings of blood chemistry: Secondary | ICD-10-CM | POA: Diagnosis not present

## 2022-06-28 DIAGNOSIS — R0789 Other chest pain: Principal | ICD-10-CM | POA: Insufficient documentation

## 2022-06-28 DIAGNOSIS — F1994 Other psychoactive substance use, unspecified with psychoactive substance-induced mood disorder: Secondary | ICD-10-CM | POA: Diagnosis present

## 2022-06-28 DIAGNOSIS — R079 Chest pain, unspecified: Secondary | ICD-10-CM | POA: Diagnosis present
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
## 2022-06-29 ENCOUNTER — Emergency Department (HOSPITAL_COMMUNITY): Payer: Medicaid Other

## 2022-06-29 ENCOUNTER — Observation Stay (HOSPITAL_COMMUNITY): Payer: Medicaid Other

## 2022-06-29 ENCOUNTER — Observation Stay (HOSPITAL_BASED_OUTPATIENT_CLINIC_OR_DEPARTMENT_OTHER): Payer: Medicaid Other

## 2022-06-29 ENCOUNTER — Other Ambulatory Visit: Payer: Self-pay

## 2022-06-29 DIAGNOSIS — R7989 Other specified abnormal findings of blood chemistry: Secondary | ICD-10-CM | POA: Diagnosis not present

## 2022-06-29 DIAGNOSIS — G40909 Epilepsy, unspecified, not intractable, without status epilepticus: Secondary | ICD-10-CM

## 2022-06-29 DIAGNOSIS — E876 Hypokalemia: Secondary | ICD-10-CM | POA: Diagnosis present

## 2022-06-29 DIAGNOSIS — I160 Hypertensive urgency: Secondary | ICD-10-CM | POA: Diagnosis not present

## 2022-06-29 DIAGNOSIS — R072 Precordial pain: Secondary | ICD-10-CM

## 2022-06-29 DIAGNOSIS — F1210 Cannabis abuse, uncomplicated: Secondary | ICD-10-CM | POA: Diagnosis present

## 2022-06-29 DIAGNOSIS — R079 Chest pain, unspecified: Secondary | ICD-10-CM | POA: Diagnosis not present

## 2022-06-29 DIAGNOSIS — F1994 Other psychoactive substance use, unspecified with psychoactive substance-induced mood disorder: Secondary | ICD-10-CM

## 2022-06-29 DIAGNOSIS — F251 Schizoaffective disorder, depressive type: Secondary | ICD-10-CM | POA: Diagnosis not present

## 2022-06-29 DIAGNOSIS — G629 Polyneuropathy, unspecified: Secondary | ICD-10-CM

## 2022-06-29 DIAGNOSIS — F1414 Cocaine abuse with cocaine-induced mood disorder: Secondary | ICD-10-CM

## 2022-06-29 LAB — COMPREHENSIVE METABOLIC PANEL
ALT: 40 U/L (ref 0–44)
AST: 41 U/L (ref 15–41)
Albumin: 4.3 g/dL (ref 3.5–5.0)
Alkaline Phosphatase: 45 U/L (ref 38–126)
Anion gap: 14 (ref 5–15)
BUN: 10 mg/dL (ref 6–20)
CO2: 21 mmol/L — ABNORMAL LOW (ref 22–32)
Calcium: 8.8 mg/dL — ABNORMAL LOW (ref 8.9–10.3)
Chloride: 105 mmol/L (ref 98–111)
Creatinine, Ser: 1.05 mg/dL (ref 0.61–1.24)
GFR, Estimated: >60 mL/min (ref >60)
Glucose, Bld: 151 mg/dL — ABNORMAL HIGH (ref 70–99)
Potassium: 3.2 mmol/L — ABNORMAL LOW (ref 3.5–5.1)
Sodium: 140 mmol/L (ref 135–145)
Total Bilirubin: 0.7 mg/dL (ref 0.3–1.2)
Total Protein: 7.5 g/dL (ref 6.5–8.1)

## 2022-06-29 LAB — RAPID URINE DRUG SCREEN, HOSP PERFORMED
Amphetamines: NOT DETECTED
Barbiturates: NOT DETECTED
Benzodiazepines: NOT DETECTED
Cocaine: POSITIVE — AB
Opiates: NOT DETECTED
Tetrahydrocannabinol: POSITIVE — AB

## 2022-06-29 LAB — CBC
HCT: 45.6 % (ref 39.0–52.0)
Hemoglobin: 15.1 g/dL (ref 13.0–17.0)
MCH: 25.6 pg — ABNORMAL LOW (ref 26.0–34.0)
MCHC: 33.1 g/dL (ref 30.0–36.0)
MCV: 77.4 fL — ABNORMAL LOW (ref 80.0–100.0)
Platelets: 335 10*3/uL (ref 150–400)
RBC: 5.89 MIL/uL — ABNORMAL HIGH (ref 4.22–5.81)
RDW: 13.4 % (ref 11.5–15.5)
WBC: 7.8 10*3/uL (ref 4.0–10.5)
nRBC: 0 % (ref 0.0–0.2)

## 2022-06-29 LAB — D-DIMER, QUANTITATIVE: D-Dimer, Quant: 0.27 ug/mL-FEU (ref 0.00–0.50)

## 2022-06-29 LAB — LIPASE, BLOOD: Lipase: 33 U/L (ref 11–51)

## 2022-06-29 LAB — LIPID PANEL
Cholesterol: 114 mg/dL (ref 0–200)
HDL: 45 mg/dL (ref >40)
LDL Cholesterol: 50 mg/dL (ref 0–99)
Total CHOL/HDL Ratio: 2.5 RATIO
Triglycerides: 96 mg/dL (ref <150)
VLDL: 19 mg/dL (ref 0–40)

## 2022-06-29 LAB — HIV ANTIBODY (ROUTINE TESTING W REFLEX): HIV Screen 4th Generation wRfx: NONREACTIVE

## 2022-06-29 LAB — ECHOCARDIOGRAM COMPLETE
Area-P 1/2: 1.67 cm2
Calc EF: 47 %
Height: 73 in
MV VTI: 4.43 cm2
S' Lateral: 3.1 cm
Single Plane A2C EF: 48.3 %
Single Plane A4C EF: 50.4 %
Weight: 3280.44 oz

## 2022-06-29 LAB — TROPONIN I (HIGH SENSITIVITY)
Troponin I (High Sensitivity): 18 ng/L — ABNORMAL HIGH (ref <18)
Troponin I (High Sensitivity): 21 ng/L — ABNORMAL HIGH (ref <18)
Troponin I (High Sensitivity): 23 ng/L — ABNORMAL HIGH (ref <18)
Troponin I (High Sensitivity): 25 ng/L — ABNORMAL HIGH (ref <18)

## 2022-06-29 LAB — TSH: TSH: 1.106 u[IU]/mL (ref 0.350–4.500)

## 2022-06-29 LAB — MAGNESIUM: Magnesium: 1.8 mg/dL (ref 1.7–2.4)

## 2022-06-29 MED ORDER — CLONIDINE HCL 0.2 MG PO TABS
0.3000 mg | ORAL_TABLET | Freq: Three times a day (TID) | ORAL | Status: DC
  Administered 2022-06-29 – 2022-06-30 (×4): 0.3 mg via ORAL
  Filled 2022-06-29 (×4): qty 1

## 2022-06-29 MED ORDER — CALCIUM GLUCONATE-NACL 1-0.675 GM/50ML-% IV SOLN
1.0000 g | Freq: Once | INTRAVENOUS | Status: AC
  Administered 2022-06-29: 1000 mg via INTRAVENOUS
  Filled 2022-06-29: qty 50

## 2022-06-29 MED ORDER — ACETAMINOPHEN 650 MG RE SUPP: 650.0000 mg | Freq: Four times a day (QID) | RECTAL | Status: DC | PRN

## 2022-06-29 MED ORDER — ACETAMINOPHEN 325 MG PO TABS: 650.0000 mg | ORAL_TABLET | Freq: Four times a day (QID) | ORAL | Status: DC | PRN

## 2022-06-29 MED ORDER — IOHEXOL 350 MG/ML SOLN
100.0000 mL | Freq: Once | INTRAVENOUS | Status: AC | PRN
  Administered 2022-06-29: 100 mL via INTRAVENOUS

## 2022-06-29 MED ORDER — LEVETIRACETAM 500 MG PO TABS
500.0000 mg | ORAL_TABLET | Freq: Two times a day (BID) | ORAL | Status: DC
  Administered 2022-06-29 – 2022-06-30 (×3): 500 mg via ORAL
  Filled 2022-06-29 (×3): qty 1

## 2022-06-29 MED ORDER — ONDANSETRON HCL 4 MG/2ML IJ SOLN
4.0000 mg | Freq: Four times a day (QID) | INTRAMUSCULAR | Status: DC | PRN
  Administered 2022-06-29: 4 mg via INTRAVENOUS
  Filled 2022-06-29: qty 2

## 2022-06-29 MED ORDER — GABAPENTIN 400 MG PO CAPS
400.0000 mg | ORAL_CAPSULE | Freq: Three times a day (TID) | ORAL | Status: DC
  Administered 2022-06-29 – 2022-06-30 (×4): 400 mg via ORAL
  Filled 2022-06-29 (×4): qty 1

## 2022-06-29 MED ORDER — LORAZEPAM 1 MG PO TABS
1.0000 mg | ORAL_TABLET | Freq: Once | ORAL | Status: AC
  Administered 2022-06-29: 1 mg via ORAL
  Filled 2022-06-29: qty 1

## 2022-06-29 MED ORDER — IRBESARTAN 300 MG PO TABS
300.0000 mg | ORAL_TABLET | Freq: Every day | ORAL | Status: DC
  Administered 2022-06-29 – 2022-06-30 (×2): 300 mg via ORAL
  Filled 2022-06-29 (×2): qty 1

## 2022-06-29 MED ORDER — ENOXAPARIN SODIUM 40 MG/0.4ML IJ SOSY
40.0000 mg | PREFILLED_SYRINGE | INTRAMUSCULAR | Status: DC
  Administered 2022-06-29 – 2022-06-30 (×2): 40 mg via SUBCUTANEOUS
  Filled 2022-06-29 (×2): qty 0.4

## 2022-06-29 MED ORDER — HYDROXYZINE HCL 25 MG PO TABS: 25.0000 mg | ORAL_TABLET | Freq: Three times a day (TID) | ORAL | Status: DC | PRN

## 2022-06-29 MED ORDER — ONDANSETRON HCL 4 MG PO TABS: 4.0000 mg | ORAL_TABLET | Freq: Four times a day (QID) | ORAL | Status: DC | PRN

## 2022-06-29 MED ORDER — SODIUM CHLORIDE 0.9% FLUSH
3.0000 mL | Freq: Two times a day (BID) | INTRAVENOUS | Status: DC
  Administered 2022-06-29 – 2022-06-30 (×2): 3 mL via INTRAVENOUS

## 2022-06-29 MED ORDER — POTASSIUM CHLORIDE CRYS ER 20 MEQ PO TBCR
40.0000 meq | EXTENDED_RELEASE_TABLET | ORAL | Status: AC
  Administered 2022-06-29: 40 meq via ORAL
  Filled 2022-06-29: qty 2

## 2022-06-29 MED ORDER — NITROGLYCERIN 0.4 MG SL SUBL
0.4000 mg | SUBLINGUAL_TABLET | SUBLINGUAL | Status: DC | PRN
  Administered 2022-06-29: 0.4 mg via SUBLINGUAL
  Filled 2022-06-29: qty 1

## 2022-06-29 MED ORDER — HYDRALAZINE HCL 20 MG/ML IJ SOLN: 10.0000 mg | INTRAMUSCULAR | Status: DC | PRN

## 2022-06-29 MED ORDER — ALBUTEROL SULFATE (2.5 MG/3ML) 0.083% IN NEBU: 2.5000 mg | INHALATION_SOLUTION | Freq: Four times a day (QID) | RESPIRATORY_TRACT | Status: DC | PRN

## 2022-06-29 MED ORDER — ASPIRIN 81 MG PO CHEW
324.0000 mg | CHEWABLE_TABLET | Freq: Once | ORAL | Status: AC
  Administered 2022-06-29: 324 mg via ORAL
  Filled 2022-06-29: qty 4

## 2022-06-29 MED ORDER — AMLODIPINE BESYLATE 10 MG PO TABS
10.0000 mg | ORAL_TABLET | Freq: Every day | ORAL | Status: DC
  Administered 2022-06-29 – 2022-06-30 (×2): 10 mg via ORAL
  Filled 2022-06-29: qty 1
  Filled 2022-06-29: qty 2

## 2022-06-29 MED ORDER — SODIUM CHLORIDE 0.9 % IV SOLN: INTRAVENOUS | Status: DC
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
## 2022-06-29 NOTE — Progress Notes (Signed)
  Echocardiogram 2D Echocardiogram has been performed.  Adrian Alexander 06/29/2022, 2:22 PM
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
## 2022-06-29 NOTE — ED Notes (Signed)
Report called to Jerel Shepherd RN
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
## 2022-06-30 ENCOUNTER — Ambulatory Visit (HOSPITAL_COMMUNITY)
Admission: EM | Admit: 2022-06-30 | Discharge: 2022-06-30 | Disposition: A | Discharge status: Home / Self Care | Payer: Medicaid Other | Attending: Psychiatry | Admitting: Psychiatry

## 2022-06-30 ENCOUNTER — Observation Stay (HOSPITAL_COMMUNITY): Payer: Medicaid Other

## 2022-06-30 DIAGNOSIS — F1414 Cocaine abuse with cocaine-induced mood disorder: Secondary | ICD-10-CM | POA: Insufficient documentation

## 2022-06-30 DIAGNOSIS — F411 Generalized anxiety disorder: Secondary | ICD-10-CM | POA: Insufficient documentation

## 2022-06-30 DIAGNOSIS — F4310 Post-traumatic stress disorder, unspecified: Secondary | ICD-10-CM | POA: Insufficient documentation

## 2022-06-30 DIAGNOSIS — R440 Auditory hallucinations: Secondary | ICD-10-CM | POA: Insufficient documentation

## 2022-06-30 DIAGNOSIS — F320 Major depressive disorder, single episode, mild: Secondary | ICD-10-CM | POA: Insufficient documentation

## 2022-06-30 DIAGNOSIS — R079 Chest pain, unspecified: Secondary | ICD-10-CM | POA: Diagnosis not present

## 2022-06-30 DIAGNOSIS — F1994 Other psychoactive substance use, unspecified with psychoactive substance-induced mood disorder: Secondary | ICD-10-CM

## 2022-06-30 DIAGNOSIS — Z638 Other specified problems related to primary support group: Secondary | ICD-10-CM | POA: Insufficient documentation

## 2022-06-30 LAB — CBC
HCT: 39.6 % (ref 39.0–52.0)
Hemoglobin: 12.8 g/dL — ABNORMAL LOW (ref 13.0–17.0)
MCH: 25.2 pg — ABNORMAL LOW (ref 26.0–34.0)
MCHC: 32.3 g/dL (ref 30.0–36.0)
MCV: 78 fL — ABNORMAL LOW (ref 80.0–100.0)
Platelets: 227 10*3/uL (ref 150–400)
RBC: 5.08 MIL/uL (ref 4.22–5.81)
RDW: 13.4 % (ref 11.5–15.5)
WBC: 3.8 10*3/uL — ABNORMAL LOW (ref 4.0–10.5)
nRBC: 0 % (ref 0.0–0.2)

## 2022-06-30 LAB — BASIC METABOLIC PANEL
Anion gap: 4 — ABNORMAL LOW (ref 5–15)
BUN: 13 mg/dL (ref 6–20)
CO2: 28 mmol/L (ref 22–32)
Calcium: 8.5 mg/dL — ABNORMAL LOW (ref 8.9–10.3)
Chloride: 105 mmol/L (ref 98–111)
Creatinine, Ser: 0.87 mg/dL (ref 0.61–1.24)
GFR, Estimated: >60 mL/min (ref >60)
Glucose, Bld: 116 mg/dL — ABNORMAL HIGH (ref 70–99)
Potassium: 3.5 mmol/L (ref 3.5–5.1)
Sodium: 137 mmol/L (ref 135–145)

## 2022-06-30 NOTE — Progress Notes (Signed)
OT Cancellation Note  Patient Details Name: Adrian Alexander MRN: [id number] DOB: 1960/08/19   Cancelled Treatment:    Reason Eval/Treat Not Completed: OT screened, no needs identified, will sign off (No OT needs per PT, pt with active discharge orders. Will sign off, thank you.)  Donia Pounds 06/30/2022, 12:16 PM

## 2022-06-30 NOTE — Plan of Care (Signed)
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
## 2022-06-30 NOTE — ED Notes (Signed)
Discharge instructions provided and Pt stated understanding. Pt alert, orient and ambulatory prior to d/c from facility. Personal belongings returned. Patient escorted to the lobby to discharge from the building. Safety maintained.

## 2022-06-30 NOTE — TOC Transition Note (Signed)
Transition of Care [facility]) - CM/SW Discharge Note   Patient Details  Name: Adrian Alexander MRN: [id number] Date of Birth: Mar 06, 1961  Transition of Care [facility]) CM/SW Contact:  Lawerance Sabal, RN Phone Number: 06/30/2022, 12:10 PM   Clinical Narrative:     RW to be delivered to the room before DC through [employer]   Final next level of care: Home/Self Care Barriers to Discharge: No Barriers Identified   Patient Goals and CMS Choice      Discharge Placement                         Discharge Plan and Services Additional resources added to the After Visit Summary for                  DME Arranged: Walker rolling DME Agency: [employer] Date DME Agency Contacted: 06/30/22 Time DME Agency Contacted: 1210 Representative spoke with at DME Agency: Leavy Cella            Social Determinants of Health (SDOH) Interventions SDOH Screenings   Food Insecurity: No Food Insecurity (06/29/2022)  Housing: Low Risk  (06/29/2022)  Transportation Needs: No Transportation Needs (06/29/2022)  Utilities: Not At Risk (06/29/2022)  Alcohol Screen: Medium Risk (01/30/2020)  Depression (PHQ2-9): Low Risk  (04/25/2020)  Tobacco Use: High Risk (06/28/2022)     Readmission Risk Interventions     No data to display

## 2022-06-30 NOTE — Evaluation (Signed)
Physical Therapy Evaluation Patient Details Name: Adrian Alexander MRN: [id number] DOB: Feb 07, 1961 Today's Date: 06/30/2022  History of Present Illness  Pt is a 62 yo male presenting with chest pain. PMH: bipolar disorder, schizoaffective disorder, PTSD, HTN  Clinical Impression  Pt is presenting close to baseline. Mod I for bed mobility and sit to stand. Supervision for gait at this time. Pt states that he uses a SPC at home. Pt would most likely benefit from RW. Pt is cleared for home with family from a PT perspective once medically stable. Pt did not perform stairs but with performance with gait and sit to stand pt demonstrates ability to navigate stairs per home set up with supervision level assistance. Pt was reporting dizziness during session. HR and BP WNL. Orthostatics taken and were negative. If dizziness continues discussed following up with primary care provider for out patient physical therapy. No recommended skilled physical therapy services at this time.      Recommendations for follow up therapy are one component of a multi-disciplinary discharge planning process, led by the attending physician.  Recommendations may be updated based on patient status, additional functional criteria and insurance authorization.  Follow Up Recommendations       Assistance Recommended at Discharge PRN  Patient can return home with the following  A little help with walking and/or transfers;Assistance with cooking/housework;Assist for transportation;Help with stairs or ramp for entrance    Equipment Recommendations Rolling walker (2 wheels)  Recommendations for Other Services       Functional Status Assessment Patient has had a recent decline in their functional status and demonstrates the ability to make significant improvements in function in a reasonable and predictable amount of time.     Precautions / Restrictions Precautions Precautions: Fall Restrictions Weight Bearing Restrictions: No       Mobility  Bed Mobility Overal bed mobility: Modified Independent             General bed mobility comments: slight increase in time Patient Response: Cooperative  Transfers Overall transfer level: Modified independent Equipment used: Rolling walker (2 wheels)               General transfer comment: Slight increase in time and 2x verbal cues for correct hand placement to prevent pulling up from RW.    Ambulation/Gait Ambulation/Gait assistance: Supervision Gait Distance (Feet): 40 Feet Assistive device: Rolling walker (2 wheels) Gait Pattern/deviations: Trunk flexed, Step-through pattern, Decreased dorsiflexion - left, Decreased dorsiflexion - right Gait velocity: Slightly decreased cadence. Gait velocity interpretation: <1.8 ft/sec, indicate of risk for recurrent falls   General Gait Details: Very large steps, forward flexed trunk, pt stated he was dizzy orthostatics taken and were negative, HR remained between 50-60 bpm. Pt stated he needed a seated rest break after 20 feet of gait. Then ambulated another 20 ft. Pt stating that he doesn't feel well and is nauseous. MD and RN aware.  Stairs Stairs:  (not attempted but pt demonstrates sufficient strength with sit to stand and gait to navigate stairs per home set up)             Balance Overall balance assessment: Mild deficits observed, not formally tested         Pertinent Vitals/Pain Pain Assessment Pain Assessment: 0-10 Pain Score: 8  Pain Location: low back Pain Descriptors / Indicators: Aching Pain Intervention(s): Monitored during session, Limited activity within patient's tolerance    Home Living Family/patient expects to be discharged to:: Private residence Living Arrangements: Spouse/significant other Available  Help at Discharge: Family Type of Home: House Home Access: Stairs to enter   Secretary/administrator of Steps: 1   Home Layout: One level Home Equipment: Cane - single point       Prior Function Prior Level of Function : Independent/Modified Independent             Mobility Comments: pt states that he can walk with SPC but his legs get weak and have muscle spasms ADLs Comments: pt states he is independent     Hand Dominance   Dominant Hand: Right    Extremity/Trunk Assessment   Upper Extremity Assessment Upper Extremity Assessment: Generalized weakness    Lower Extremity Assessment Lower Extremity Assessment: Generalized weakness    Cervical / Trunk Assessment Cervical / Trunk Assessment: Kyphotic  Communication   Communication: No difficulties  Cognition Arousal/Alertness: Awake/alert, Lethargic Behavior During Therapy: WFL for tasks assessed/performed Overall Cognitive Status: Within Functional Limits for tasks assessed       General Comments: Difficult to assess overall pt was perseverating on his step son        General Comments General comments (skin integrity, edema, etc.): pt orhostatics Supine: 140/82, Sitting 135/85, Standing 143/93. Attempted vestibular evaluation due to pt stating he is dizzy with moving his head, hard to assess due to conflicting information given by pt from one moment to the next even with re testing for VOR and VOR cancellation symptoms. Pt seemed to be holding cervical spine in far forward head posture with slight L turn possible stiff. States he feels pain in his neck when rotating his head.        Assessment/Plan    PT Assessment Patient needs continued PT services  PT Problem List Decreased mobility;Decreased balance       PT Treatment Interventions DME instruction;Therapeutic activities;Gait training;Therapeutic exercise;Patient/family education;Stair training;Balance training;Functional mobility training;Neuromuscular re-education    PT Goals (Current goals can be found in the Care Plan section)  Acute Rehab PT Goals Patient Stated Goal: To get stronger and improve dizziness PT Goal Formulation:  With patient Time For Goal Achievement: 07/14/22 Potential to Achieve Goals: Fair    Frequency Min 2X/week        AM-PAC PT "6 Clicks" Mobility  Outcome Measure Help needed turning from your back to your side while in a flat bed without using bedrails?: None Help needed moving from lying on your back to sitting on the side of a flat bed without using bedrails?: None Help needed moving to and from a bed to a chair (including a wheelchair)?: A Little Help needed standing up from a chair using your arms (e.g., wheelchair or bedside chair)?: A Little Help needed to walk in hospital room?: A Little Help needed climbing 3-5 steps with a railing? : A Little 6 Click Score: 20    End of Session   Activity Tolerance: Other (comment) (pt limited due to reporting fatigue and dizziness.) Patient left: in bed;with call bell/phone within reach Nurse Communication: Mobility status PT Visit Diagnosis: Unsteadiness on feet (R26.81)    Time: 0156-1537 PT Time Calculation (min) (ACUTE ONLY): 24 min   Charges:   PT Evaluation $PT Eval Low Complexity: 1 Low PT Treatments $Therapeutic Activity: 8-22 mins      Harrel Carina, DPT, CLT  Acute Rehabilitation Services Office: [phone] (Secure chat preferred)   Claudia Desanctis 06/30/2022, 11:36 AM

## 2022-06-30 NOTE — Progress Notes (Signed)
   06/30/22 1524  [facility] Triage Screening (Walk-ins at [facility] only)  How Did You Hear About [place]? Family/Friend  What Is the Reason for Your Visit/Call Today? Pt presents to [facility] voluntarily escorted by his wife. Pt states he would like medication management and to get set up with a new outpatient psychiatrist and therapist . Pt reports family discord and being triggered by her step-son being disrespectful to him and his wife. Pt reports being diagnosed with PTSD,depression and is prescribed Seroquel, 100mg . Pt states he has not taken his medication in the last 2 days. Pt reports using alcohol 3 four loko's, cocaine " a few snorts "and marijuana "3 blunts",2 days ago prior to being admitted to the hospital for chest pain. Pt denies SI/HI and AVH.  How Long Has This Been Causing You Problems? <Week  Have You Recently Had Any Thoughts About Hurting Yourself? No  Are You Planning to Commit Suicide/Harm Yourself At This time? No  Have you Recently Had Thoughts About Hurting Someone Karolee Ohs? No  Are You Planning To Harm Someone At This Time? No  Are you currently experiencing any auditory, visual or other hallucinations? No  Have You Used Any Alcohol or Drugs in the Past 24 Hours? No  Do you have any current medical co-morbidities that require immediate attention? No  Clinician description of patient physical appearance/behavior: difficulty ambulating,  What Do You Feel Would Help You the Most Today? Medication(s);Treatment for Depression or other mood problem  If access to [facility] was not available, would you have sought care in the Emergency Department? No  Determination of Need Routine (7 days)  Options For Referral Outpatient Therapy;Medication Management

## 2022-06-30 NOTE — Discharge Instructions (Signed)
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
## 2022-09-29 ENCOUNTER — Emergency Department (HOSPITAL_COMMUNITY)
Admission: EM | Admit: 2022-09-29 | Discharge: 2022-09-29 | Disposition: A | Discharge status: Home / Self Care | Payer: MEDICAID | Attending: Student | Admitting: Student

## 2022-09-29 ENCOUNTER — Encounter (HOSPITAL_COMMUNITY): Payer: Self-pay

## 2022-09-29 ENCOUNTER — Other Ambulatory Visit: Payer: Self-pay

## 2022-09-29 DIAGNOSIS — M545 Low back pain, unspecified: Secondary | ICD-10-CM | POA: Diagnosis present

## 2022-09-29 MED ORDER — CYCLOBENZAPRINE HCL 5 MG PO TABS: 5.0000 mg | ORAL_TABLET | Freq: Three times a day (TID) | ORAL | 0 refills | Status: AC | PRN

## 2022-09-29 MED ORDER — LIDOCAINE 5 % EX PTCH
1.0000 | MEDICATED_PATCH | CUTANEOUS | Status: DC
  Administered 2022-09-29: 1 via TRANSDERMAL
  Filled 2022-09-29: qty 1

## 2022-09-29 MED ORDER — IBUPROFEN 800 MG PO TABS: 800.0000 mg | ORAL_TABLET | Freq: Three times a day (TID) | ORAL | 0 refills | Status: AC | PRN

## 2022-09-29 MED ORDER — CYCLOBENZAPRINE HCL 10 MG PO TABS
5.0000 mg | ORAL_TABLET | Freq: Once | ORAL | Status: AC
  Administered 2022-09-29: 5 mg via ORAL
  Filled 2022-09-29: qty 1

## 2022-09-29 MED ORDER — KETOROLAC TROMETHAMINE 30 MG/ML IJ SOLN
30.0000 mg | Freq: Once | INTRAMUSCULAR | Status: AC
  Administered 2022-09-29: 30 mg via INTRAMUSCULAR
  Filled 2022-09-29: qty 1

## 2022-09-29 NOTE — Discharge Instructions (Addendum)
Please follow-up with your primary care doctor, return to the ER if you have any kind of numbness, tingling in your legs, or any loss of bowel, bladder, or frequent falls.
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
## 2022-09-29 NOTE — ED Provider Notes (Signed)
[place] EMERGENCY DEPARTMENT AT [facility] Provider Note   CSN: [id number] Arrival date & time: 09/29/22  1233     History  Chief Complaint  Patient presents with   Back Pain    Adrian Alexander is a 62 y.o. male, history history of low back pain, schizophrenia, who presents to the ED secondary to low back pain, that happened after he was trying to pick up a top truck tire off the ground around 1230 today.  He states that after he tried to pick the truck tire off, he felt something twitch in his right side of his back, and now he is having a lot of pain in the right side of his back.  Denies any loss of bowel, bladder, no weakness in the legs, or pain down his legs.  Is denied any fevers, chills no recent IV drug use.  Has not take anything for the pain.     Home Medications Prior to Admission medications   Medication Sig Start Date End Date Taking? Authorizing Provider  cyclobenzaprine (FLEXERIL) 5 MG tablet Take 1 tablet (5 mg total) by mouth 3 (three) times daily as needed. 09/29/22  Yes Ellissa Ayo L, PA  ibuprofen (ADVIL) 800 MG tablet Take 1 tablet (800 mg total) by mouth every 8 (eight) hours as needed. 09/29/22  Yes Evany Schecter L, PA  amLODipine (NORVASC) 10 MG tablet Take 1 tablet by mouth daily. 12/09/18   [provider]  cholecalciferol (VITAMIN D) 1000 UNITS tablet Take 2 tablets (2,000 Units total) by mouth daily. 01/27/15   [name], NP  cloNIDine (CATAPRES) 0.3 MG tablet Take 0.3 mg by mouth 3 (three) times daily. 04/24/21   [provider]  cycloSPORINE (RESTASIS) 0.05 % ophthalmic emulsion Restasis 0.05 % eye drops in a dropperette  Instill ONE drop IN [facility] TWICE DAILY    [provider]  diclofenac Sodium (VOLTAREN) 1 % GEL Apply 2 g topically 4 (four) times daily. 04/09/19   Gailen Shelter, PA  docusate sodium (COLACE) 100 MG capsule Take 100 mg by mouth daily as needed for mild constipation.     [provider]  folic acid (FOLVITE) 1 MG tablet Take 1 tablet every day by oral route for 90 days. 11/06/21   [provider]  gabapentin (NEURONTIN) 400 MG capsule Take 1 capsule (400 mg total) by mouth 3 (three) times daily. 08/07/19 10/06/19  Joy, Shawn C, PA-C  hydrOXYzine (ATARAX) 25 MG tablet Take 1 tablet by mouth 3 (three) times daily as needed. 12/09/18   [provider]  levETIRAcetam (KEPPRA) 500 MG tablet Take 500 mg by mouth 2 (two) times daily. 02/27/21   [provider]  meloxicam (MOBIC) 7.5 MG tablet Take 7.5 mg by mouth daily.  11/07/18   [provider]  Multiple Vitamin (MULTIVITAMIN WITH MINERALS) TABS tablet Take 1 tablet by mouth daily. 01/27/15   [name], NP  naproxen (NAPROSYN) 500 MG tablet Take 500 mg by mouth daily.     [provider]  pantoprazole (PROTONIX) 40 MG tablet Take 2 tablets (80 mg total) by mouth 2 (two) times daily. Patient taking differently: Take 40 mg by mouth daily. 03/30/17   Levora Dredge, MD  potassium chloride (MICRO-K) 10 MEQ CR capsule Take 10 mEq by mouth daily.     [provider]  QUEtiapine (SEROQUEL) 400 MG tablet Take 1 tablet (400 mg total) by mouth at bedtime. 02/02/20   Gerilyn Pilgrim,  Marcelyn Ditty, NP  spironolactone (ALDACTONE) 50 MG tablet Take 50 mg by mouth daily. 05/01/21   [provider]  telmisartan (MICARDIS) 80 MG tablet telmisartan 80 mg tablet  Take 1 tablet every day by oral route for 90 days.    [provider]  terbinafine (LAMISIL) 250 MG tablet Take 250 mg by mouth daily.     [provider]  traMADol (ULTRAM) 50 MG tablet Take 1 tablet (50 mg total) by mouth every 6 (six) hours as needed. Patient taking differently: Take 50 mg by mouth every 6 (six) hours as needed for moderate pain. 04/13/19   Gwyneth Sprout, MD      Allergies    Patient has no known allergies.    Review of Systems   Review of Systems  Constitutional:  Negative for fever.   Musculoskeletal:  Positive for back pain.    Physical Exam Updated Vital Signs BP (!) 142/98 (BP Location: Right Arm)   Pulse 65   Temp 98.1 F (36.7 C) (Oral)   Resp 18   Ht 6\' 1"  (1.854 m)   Wt 90.7 kg   SpO2 95%   BMI 26.39 kg/m  Physical Exam Constitutional:      General: He is not in acute distress.    Appearance: He is well-developed. He is not toxic-appearing.  HENT:     Head: Normocephalic and atraumatic.  Abdominal:     General: There is no distension.     Palpations: Abdomen is soft.     Tenderness: There is no abdominal tenderness.  Musculoskeletal:     Cervical back: Normal range of motion and neck supple. No spinous process tenderness or muscular tenderness.     Comments: No obvious deformity, appreciable swelling, erythema, ecchymosis, significant open wounds, or increased warmth.  Back: No point/focal vertebral tenderness, no palpable step off or crepitus.  Tenderness to palpation of right paraspinal muscles of the right lumbar spine.  Pain that is worse with range of motion and such as truncal rotation, flexion and extension. Lower extremities: ranging @ all major joints. No focal bony tenderness.   Skin:    General: Skin is warm and dry.     Findings: No rash.  Neurological:     Mental Status: He is alert.     Comments: Sensation grossly intact to bilateral lower extremities. 5/5 symmetric strength with plantar/dorsiflexion bilaterally. Gait is intact without obvious foot drop.      ED Results / Procedures / Treatments   Labs (all labs ordered are listed, but only abnormal results are displayed) Labs Reviewed - No data to display  EKG None  Radiology No results found.  Procedures Procedures    Medications Ordered in ED Medications  lidocaine (LIDODERM) 5 % 1 patch (1 patch Transdermal Patch Applied 09/29/22 1415)  ketorolac (TORADOL) 30 MG/ML injection 30 mg (30 mg Intramuscular Given 09/29/22 1414)  cyclobenzaprine (FLEXERIL) tablet 5 mg (5  mg Oral Given 09/29/22 1414)    ED Course/ Medical Decision Making/ A&P                             Medical Decision Making Patient is a 62 year old male, with back pain, after picking something up.  He has no radicular symptoms, and only has tenderness to palpation of the right perispinal muscles of the lumbar spine.  He has no red flag symptoms, we will treat with Toradol, Flexeril.  After further evaluation patient is  feeling much better walking and able, would like to go home.  I discharged, pain aligns with back strain, secondary to heavy lifting.  Discussed return precautions discharged home.  Flexeril and ibuprofen sent to the pharmacy  Risk Prescription drug management.    Final Clinical Impression(s) / ED Diagnoses Final diagnoses:  Acute right-sided low back pain without sciatica    Rx / DC Orders ED Discharge Orders          Ordered    ibuprofen (ADVIL) 800 MG tablet  Every 8 hours PRN        09/29/22 1435    cyclobenzaprine (FLEXERIL) 5 MG tablet  3 times daily PRN        09/29/22 1435              Marvine Encalade Elbert Ewings, PA 09/29/22 1437    Glendora Score, MD 09/29/22 [phone]

## 2022-12-19 ENCOUNTER — Ambulatory Visit: Payer: MEDICAID | Admitting: Podiatry

## 2022-12-26 ENCOUNTER — Ambulatory Visit: Payer: MEDICAID | Admitting: Podiatry

## 2022-12-31 ENCOUNTER — Encounter: Payer: Self-pay | Admitting: Physical Therapy

## 2022-12-31 ENCOUNTER — Ambulatory Visit: Payer: MEDICAID | Attending: Internal Medicine | Admitting: Physical Therapy

## 2022-12-31 VITALS — BP 76/51

## 2022-12-31 DIAGNOSIS — M6281 Muscle weakness (generalized): Secondary | ICD-10-CM | POA: Diagnosis present

## 2022-12-31 DIAGNOSIS — R2689 Other abnormalities of gait and mobility: Secondary | ICD-10-CM | POA: Diagnosis present

## 2022-12-31 DIAGNOSIS — R2681 Unsteadiness on feet: Secondary | ICD-10-CM | POA: Insufficient documentation

## 2022-12-31 DIAGNOSIS — M5459 Other low back pain: Secondary | ICD-10-CM | POA: Insufficient documentation

## 2022-12-31 NOTE — Therapy (Signed)
OUTPATIENT PHYSICAL THERAPY NEURO EVALUATION   Patient Name: Adrian Alexander MRN: [id number] DOB:04-03-1960, 62 y.o., male Today's Date: 12/31/2022   PCP: Harvest Forest, MD REFERRING PROVIDER: Harvest Forest, MD  END OF SESSION:  PT End of Session - 12/31/22 1931     Visit Number 1    Number of Visits 13    Date for PT Re-Evaluation 02/15/23    Authorization Type [employer]    PT Start Time 1020    PT Stop Time 1100    PT Time Calculation (min) 40 min    Activity Tolerance Patient tolerated treatment well    Behavior During Therapy WFL for tasks assessed/performed             Past Medical History:  Diagnosis Date   Anxiety    Bipolar affective disorder, depressed (HCC)    Hypertension    Schizo-affective schizophrenia (HCC)    Schizophrenia, schizo-affective (HCC)    Seizure (HCC)    Seizures (HCC)    Last month. gets a sensation   Past Surgical History:  Procedure Laterality Date   ANKLE SURGERY     CLOSED REDUCTION WITH HUMER PIN INSERTION     left ankle 1998   HERNIA REPAIR     while in prison   HERNIA REPAIR     Patient Active Problem List   Diagnosis Date Noted   Elevated troponin 06/29/2022   Hypertensive urgency 06/29/2022   Hypokalemia 06/29/2022   Hypocalcemia 06/29/2022   Neuropathy 06/29/2022   Marijuana abuse 06/29/2022   Pain due to onychomycosis of toenails of both feet 05/10/2021   MDD (major depressive disorder), recurrent episode, severe (HCC) 01/29/2020   Hepatitis C antibody test positive 05/21/2019   Mood disorder (HCC) 05/21/2019   Gastrointestinal hemorrhage 08/15/2017   Chest pain 03/29/2017   Gastritis 03/29/2017   Hematochezia 03/29/2017   Hematemesis 03/29/2017   Acute low back pain 12/16/2016   Dyspnea on exertion 10/03/2016   Essential hypertension 10/03/2016   External hemorrhoids 10/03/2016   Seizure disorder (HCC) 10/03/2016   Alcohol dependence with withdrawal, uncomplicated (HCC) 01/25/2015   Post  traumatic stress disorder (PTSD) 01/25/2015   Schizoaffective disorder (HCC) 01/25/2015   Cocaine abuse with cocaine-induced mood disorder (HCC) 01/25/2015   MDD (major depressive disorder) 01/24/2015   Substance induced mood disorder (HCC) 09/10/2011    ONSET DATE: Referral date 12-19-22:  July 2024 for exacerbation of low back pain  REFERRING DIAG:  Diagnosis  M54.50 (ICD-10-CM) - Low back pain    THERAPY DIAG:  Other abnormalities of gait and mobility  Other low back pain  Muscle weakness (generalized)  Unsteadiness on feet  Rationale for Evaluation and Treatment: Rehabilitation  SUBJECTIVE:  SUBJECTIVE STATEMENT: Pt reports he has had back pain for about 10 yrs; has had bil. LE weakness for past 2 yrs - wonders if he had a stroke sometime in the past. Has used [facility] for about 2 yrs. Pt states he tries to do a little almost every day - "not long and not far" Pt accompanied by: self  PERTINENT HISTORY: Anxiety, Bipolar affective disorder, depressed (HCC),Hypertension, Schizophrenia, schizo-affective (HCC),Seizure (HCC)   PAIN:  Are you having pain? Yes: NPRS scale: 6/10 Pain location: center of low back "in center of my butt cheeks" Pain description: sometimes sharp, today is dull Aggravating factors: sitting or standing too long aggravates it; sitting straight up aggravates it Relieving factors: leaning forward helps to relieve pressure  PRECAUTIONS: Other: Pt reports he is not supposed to lift anything >5#, stand or walk >15" and no getting on ladders  RED FLAGS: None   WEIGHT BEARING RESTRICTIONS: No  FALLS: Has patient fallen in last 6 months? Yes. Number of falls 4  LIVING ENVIRONMENT: Lives with: lives with their family Lives in: House/apartment Stairs: No Has following  equipment at home: Single point cane  PLOF: Independent with basic ADLs, Independent with household mobility with device, Independent with household mobility without device, and Independent with community mobility with device  PATIENT GOALS: to strengthen my legs and get better  OBJECTIVE:  Note: Objective measures were completed at Evaluation unless otherwise noted.   Vitals:   12/31/22 1050 12/31/22 1051  BP: (!) 75/55 (!) 76/51    DIAGNOSTIC FINDINGS: MRI of lumbar spine from 04/13/19: Multilevel lumbar disc degeneration with mild lateral recess andneural foraminal stenosis as above. No high-grade stenosis.  COGNITION: Overall cognitive status: Within functional limits for tasks assessed   SENSATION: Pt reports numbness in feet, under his toes   COORDINATION: WFL's bil. LE's   POSTURE: No Significant postural limitations  LOWER EXTREMITY ROM:   WFL's bil. LE AROM   LOWER EXTREMITY MMT:    MMT Right Eval Left Eval  Hip flexion NT due to strain   Hip extension    Hip abduction    Hip adduction    Hip internal rotation    Hip external rotation    Knee flexion 4 (pain with resistance) 4 (pain with resistance)  Knee extension 5 5  Ankle dorsiflexion 5 5  Ankle plantarflexion    Ankle inversion    Ankle eversion    (Blank rows = not tested)  BED MOBILITY:  Independent  TRANSFERS: Assistive device utilized: Single point cane  Sit to stand: Modified independence Stand to sit: Modified independence  GAIT: Gait pattern: step through pattern, decreased hip/knee flexion- Right, and genu recurvatum- Right Distance walked: 61' Assistive device utilized: Single point cane Level of assistance: SBA Comments: Rt knee hyperextension noted in stance; pt using SPC in Rt hand  FUNCTIONAL TESTS:  Able to transfer sit to stand from standard chair without UE support - says he felt it in his back  (1 rep only performed due to pt c/o back discomfort) TUG = 15.97 secs with  SPC   Gait velocity = 15.16 secs with SPC = 2.16 ft/sec with SPC              Balance:  standing Romberg EO = 20 secs;  EC = 9.6 secs  PATIENT SURVEYS:  N/A  TODAY'S TREATMENT:  DATE: Eval only    PATIENT EDUCATION: Education details: eval results and POC; educated pt in correct way to perform SLR (with other leg flexed) and in bridging exercise Person educated: Patient Education method: Explanation Education comprehension: verbalized understanding  HOME EXERCISE PROGRAM: To be established   GOALS: Goals reviewed with patient? Yes  SHORT TERM GOALS: Target date: 01-18-23 (3 weeks)  Independent in HEP for LE strengthening and low back stretching.  Baseline: Dependent Goal status: INITIAL  2.  Pt will report decreased low back pain to </= 5/10 intensity for increased comfort and ease with ADL's and mobility. Baseline: 6/10 intensity in LBP Goal status: INITIAL  3.  Pt will increase gait velocity to >/= 2.4 ft/sec with SPC for increased gait efficiency. Baseline: 2.16 ft/sec with SPC Goal status: INITIAL  4.  Improve TUG score to </= 13.5 secs with SPC to reduce fall risk. Baseline: 15.97 secs with SPC Goal status: INITIAL    LONG TERM GOALS: Target date: 02-15-23 (6 weeks)  Pt will report decreased low back pain to </= 3/10 intensity for increased comfort and ease with ADL's and mobility. Baseline: 6/10 intensity in LBP Goal status: INITIAL  2.  Pt will increase gait velocity to >/= 2.6 ft/sec with SPC for increased gait efficiency. Baseline: 2.16 ft/sec with SPC Goal status: INITIAL  3.  Improve TUG score to </= 12.5 secs with SPC to reduce fall risk. Baseline: 15.97 secs with SPC Goal status: INITIAL  4.  Pt will perform 5 reps sit to stand transfers without UE support from chair to demo improved LE strength.  Baseline: 1 rep only  performed on 10-14-2 due to c/o discomfort in low back Goal status: INITIAL  5.  Independent in updated HEP for LE strengthening and low back stretching.  Baseline: Dependent Goal status: INITIAL   ASSESSMENT:  CLINICAL IMPRESSION: Patient is a 62 y.o. gentleman who was seen today for physical therapy evaluation and treatment for chronic low back pain and LE weakness, with RLE weaker than LLE.  Pt is currently using SPC for assistance with ambulation; pt noted to have gait deviations including Rt genu recurvatum in stance and decreased Rt hip and knee flexion in swing phase of gait.  Pt reported discomfort in low back with sit to stand transfers without use of UE support.  Pt is at fall risk per TUG score of 15.97 secs with use of SPC.  Pt will benefit from PT to address LE weakness, low back pain, and gait and balance impairments.  OBJECTIVE IMPAIRMENTS: Abnormal gait, decreased activity tolerance, decreased balance, decreased knowledge of use of DME, difficulty walking, decreased strength, impaired sensation, and pain.   ACTIVITY LIMITATIONS: carrying, lifting, bending, standing, squatting, stairs, and locomotion level  PARTICIPATION LIMITATIONS: meal prep, cleaning, laundry, shopping, and community activity  PERSONAL FACTORS: Behavior pattern, Past/current experiences, and Time since onset of injury/illness/exacerbation are also affecting patient's functional outcome.   REHAB POTENTIAL: Good  CLINICAL DECISION MAKING: Evolving/moderate complexity  EVALUATION COMPLEXITY: Moderate  PLAN:  PT FREQUENCY: 2x/week  PT DURATION: 6 weeks  PLANNED INTERVENTIONS: 97110-Therapeutic exercises, 97530- Therapeutic activity, 97112- Neuromuscular re-education, [phone]- Self Care, [id number]- Gait training, [phone]- Aquatic Therapy, Balance training, and Stair training  PLAN FOR NEXT SESSION: issue HEP - low back stretches - bridging and SLR; leg press, [employer]   Katrinia Straker, Donavan Burnet, PT 12/31/2022,  7:37 PM     Check all possible CPT codes: 97110- Therapeutic Exercise, [phone]- Neuro Re-education, [phone] - Gait Training, [phone] -  Therapeutic Activities, [phone] - Self Care, and [id number] - Aquatic therapy    Check all conditions that are expected to impact treatment: Musculoskeletal disorders and Psychological or psychiatric disorders   If treatment provided at initial evaluation, no treatment charged due to lack of authorization.

## 2023-01-15 ENCOUNTER — Ambulatory Visit: Payer: MEDICAID | Admitting: Physical Therapy

## 2023-01-17 ENCOUNTER — Ambulatory Visit: Payer: MEDICAID | Admitting: Physical Therapy

## 2023-01-17 ENCOUNTER — Encounter: Payer: Self-pay | Admitting: Physical Therapy

## 2023-01-17 VITALS — BP 142/80 | HR 61

## 2023-01-17 DIAGNOSIS — M6281 Muscle weakness (generalized): Secondary | ICD-10-CM

## 2023-01-17 DIAGNOSIS — M5459 Other low back pain: Secondary | ICD-10-CM

## 2023-01-17 DIAGNOSIS — R2689 Other abnormalities of gait and mobility: Secondary | ICD-10-CM | POA: Diagnosis not present

## 2023-01-17 NOTE — Therapy (Signed)
OUTPATIENT PHYSICAL THERAPY NEURO TREATMENT NOTE   Patient Name: Adrian Alexander MRN: [id number] DOB:May 05, 1960, 62 y.o., male Today's Date: 01/17/2023   PCP: Harvest Forest, MD REFERRING PROVIDER: Harvest Forest, MD  END OF SESSION:  PT End of Session - 01/17/23 1912     Visit Number 2    Number of Visits 13    Date for PT Re-Evaluation 02/15/23    Authorization Type [employer]    Authorization Time Period 10-14 - 02-07-23    Authorization - Visit Number 2    Authorization - Number of Visits 12    PT Start Time 0932    PT Stop Time 1015    PT Time Calculation (min) 43 min    Activity Tolerance Patient tolerated treatment well    Behavior During Therapy WFL for tasks assessed/performed              Past Medical History:  Diagnosis Date   Anxiety    Bipolar affective disorder, depressed (HCC)    Hypertension    Schizo-affective schizophrenia (HCC)    Schizophrenia, schizo-affective (HCC)    Seizure (HCC)    Seizures (HCC)    Last month. gets a sensation   Past Surgical History:  Procedure Laterality Date   ANKLE SURGERY     CLOSED REDUCTION WITH HUMER PIN INSERTION     left ankle 1998   HERNIA REPAIR     while in prison   HERNIA REPAIR     Patient Active Problem List   Diagnosis Date Noted   Elevated troponin 06/29/2022   Hypertensive urgency 06/29/2022   Hypokalemia 06/29/2022   Hypocalcemia 06/29/2022   Neuropathy 06/29/2022   Marijuana abuse 06/29/2022   Pain due to onychomycosis of toenails of both feet 05/10/2021   MDD (major depressive disorder), recurrent episode, severe (HCC) 01/29/2020   Hepatitis C antibody test positive 05/21/2019   Mood disorder (HCC) 05/21/2019   Gastrointestinal hemorrhage 08/15/2017   Chest pain 03/29/2017   Gastritis 03/29/2017   Hematochezia 03/29/2017   Hematemesis 03/29/2017   Acute low back pain 12/16/2016   Dyspnea on exertion 10/03/2016   Essential hypertension 10/03/2016   External hemorrhoids  10/03/2016   Seizure disorder (HCC) 10/03/2016   Alcohol dependence with withdrawal, uncomplicated (HCC) 01/25/2015   Post traumatic stress disorder (PTSD) 01/25/2015   Schizoaffective disorder (HCC) 01/25/2015   Cocaine abuse with cocaine-induced mood disorder (HCC) 01/25/2015   MDD (major depressive disorder) 01/24/2015   Substance induced mood disorder (HCC) 09/10/2011    ONSET DATE: Referral date 12-19-22:  July 2024 for exacerbation of low back pain  REFERRING DIAG:  Diagnosis  M54.50 (ICD-10-CM) - Low back pain    THERAPY DIAG:  Muscle weakness (generalized)  Other low back pain  Rationale for Evaluation and Treatment: Rehabilitation  SUBJECTIVE:  SUBJECTIVE STATEMENT: Pt reports he missed his last PT appt because he didn't realize he had an appt scheduled.  No changes reported since eval Pt accompanied by: self  PERTINENT HISTORY: Anxiety, Bipolar affective disorder, depressed (HCC),Hypertension, Schizophrenia, schizo-affective (HCC),Seizure (HCC)   PAIN:  Are you having pain? Yes: NPRS scale: 2/10 Pain location: center of low back "in center of my butt cheeks" Pain description: sometimes sharp, today is dull Aggravating factors: sitting or standing too long aggravates it; sitting straight up aggravates it Relieving factors: leaning forward helps to relieve pressure  PRECAUTIONS: Other: Pt reports he is not supposed to lift anything >5#, stand or walk >15" and no getting on ladders  RED FLAGS: None   WEIGHT BEARING RESTRICTIONS: No  FALLS: Has patient fallen in last 6 months? Yes. Number of falls 4  LIVING ENVIRONMENT: Lives with: lives with their family Lives in: House/apartment Stairs: No Has following equipment at home: Single point cane  PLOF: Independent with basic  ADLs, Independent with household mobility with device, Independent with household mobility without device, and Independent with community mobility with device  PATIENT GOALS: to strengthen my legs and get better  OBJECTIVE:  Note: Objective measures were completed at Evaluation unless otherwise noted.   Vitals:   01/17/23 0932  BP: (!) 142/80  Pulse: 61     DIAGNOSTIC FINDINGS: MRI of lumbar spine from 04/13/19: Multilevel lumbar disc degeneration with mild lateral recess andneural foraminal stenosis as above. No high-grade stenosis.  COGNITION: Overall cognitive status: Within functional limits for tasks assessed   SENSATION: Pt reports numbness in feet, under his toes   COORDINATION: WFL's bil. LE's   POSTURE: No Significant postural limitations  LOWER EXTREMITY ROM:   WFL's bil. LE AROM   LOWER EXTREMITY MMT:    MMT Right Eval Left Eval  Hip flexion NT due to strain   Hip extension    Hip abduction    Hip adduction    Hip internal rotation    Hip external rotation    Knee flexion 4 (pain with resistance) 4 (pain with resistance)  Knee extension 5 5  Ankle dorsiflexion 5 5  Ankle plantarflexion    Ankle inversion    Ankle eversion    (Blank rows = not tested)  BED MOBILITY:  Independent  TRANSFERS: Assistive device utilized: Single point cane  Sit to stand: Modified independence Stand to sit: Modified independence  GAIT: Gait pattern: step through pattern, decreased hip/knee flexion- Right, and genu recurvatum- Right Distance walked: 46' Assistive device utilized: Single point cane Level of assistance: SBA Comments: Rt knee hyperextension noted in stance; pt using SPC in Rt hand  FUNCTIONAL TESTS:  Able to transfer sit to stand from standard chair without UE support - says he felt it in his back  (1 rep only performed due to pt c/o back discomfort) TUG = 15.97 secs with SPC   Gait velocity = 15.16 secs with SPC = 2.16 ft/sec with SPC               Balance:  standing Romberg EO = 20 secs;  EC = 9.6 secs  PATIENT SURVEYS:  N/A  TODAY'S TREATMENT:  DATE:  01-17-23  SLR 3# 10 reps 2 sets LLE;   RLE 3#  10 reps x 2 sets Bridging 10 reps;  Bridging with LE extension 5 reps each leg  Bridging with hip abdct/adduction 5 reps Bridging with marching 5 reps each leg Hip abdct. 10 reps in sidelying 2# weight - RLE & LLE:       Clam shell 2# 10 reps RLE & LLE Single knee to chest 15 sec hold x 1 rep; double knee to chest 15 sec hold x 3 reps Sit to stand 1 rep - pt reported discomfort in low back so exercise was stopped Leg press 60# 3 sets 10 reps; calf raise 60# 20 reps   Access Code: [email or web address] URL: [email or web address] Date: 01/17/2023 Prepared by: Maebelle Munroe  Exercises - Supine Bridge  - 1 x daily - 7 x weekly - 1 sets - 10 reps - Marching Bridge  - 1 x daily - 7 x weekly - 1 sets - 5 reps - Bridge with knees apart - NO RESISTANCE  - 1 x daily - 7 x weekly - 1 sets - 5 reps - Supine Bridge with Leg Extension  - 1 x daily - 7 x weekly - 1 sets - 5 reps - Supine Straight Leg Raises  - 1 x daily - 7 x weekly - 1 sets - 10 reps - Side Leg Lifts  - 1 x daily - 7 x weekly - 1 sets - 10 reps - Clamshell with Resistance  - 1 x daily - 7 x weekly - 1 sets - 10 reps - Supine Single Knee to Chest Stretch  - 1 x daily - 7 x weekly - 1 sets - 3 reps - 15 sec hold - Supine Double Knee to Chest  - 1 x daily - 7 x weekly - 1 sets - 2-3 reps - 15 sec hold - Standing Heel Raise with Support  - 1 x daily - 7 x weekly - 1 sets - 10 reps - 2-3 sec hold   PATIENT EDUCATION: Education details: Medbridge [email or web address] Person educated: Patient Education method: Explanation, demonstration, handouts Education comprehension: verbalized understanding  HOME EXERCISE PROGRAM: To be established   GOALS: Goals reviewed  with patient? Yes  SHORT TERM GOALS: Target date: 01-18-23 (3 weeks)  Independent in HEP for LE strengthening and low back stretching.  Baseline: Dependent Goal status: INITIAL  2.  Pt will report decreased low back pain to </= 5/10 intensity for increased comfort and ease with ADL's and mobility. Baseline: 6/10 intensity in LBP Goal status: INITIAL  3.  Pt will increase gait velocity to >/= 2.4 ft/sec with SPC for increased gait efficiency. Baseline: 2.16 ft/sec with SPC Goal status: INITIAL  4.  Improve TUG score to </= 13.5 secs with SPC to reduce fall risk. Baseline: 15.97 secs with SPC Goal status: INITIAL    LONG TERM GOALS: Target date: 02-15-23 (6 weeks)  Pt will report decreased low back pain to </= 3/10 intensity for increased comfort and ease with ADL's and mobility. Baseline: 6/10 intensity in LBP Goal status: INITIAL  2.  Pt will increase gait velocity to >/= 2.6 ft/sec with SPC for increased gait efficiency. Baseline: 2.16 ft/sec with SPC Goal status: INITIAL  3.  Improve TUG score to </= 12.5 secs with SPC to reduce fall risk. Baseline: 15.97 secs with SPC Goal status: INITIAL  4.  Pt will perform 5 reps sit to stand transfers without UE support from chair  to demo improved LE strength.  Baseline: 1 rep only performed on 10-14-2 due to c/o discomfort in low back Goal status: INITIAL  5.  Independent in updated HEP for LE strengthening and low back stretching.  Baseline: Dependent Goal status: INITIAL   ASSESSMENT:  CLINICAL IMPRESSION: PT session focused on establishing HEP for bil. LE strengthening as today was first PT session since initial eval on 12-31-22.  Pt reported difficulty with SLR with 3# weight with both RLE and LLE.  Pt had much difficulty with unilateral heel raise on each leg.  Pt reported fatigue at end of session.  Cont with POC.  OBJECTIVE IMPAIRMENTS: Abnormal gait, decreased activity tolerance, decreased balance, decreased knowledge  of use of DME, difficulty walking, decreased strength, impaired sensation, and pain.   ACTIVITY LIMITATIONS: carrying, lifting, bending, standing, squatting, stairs, and locomotion level  PARTICIPATION LIMITATIONS: meal prep, cleaning, laundry, shopping, and community activity  PERSONAL FACTORS: Behavior pattern, Past/current experiences, and Time since onset of injury/illness/exacerbation are also affecting patient's functional outcome.   REHAB POTENTIAL: Good  CLINICAL DECISION MAKING: Evolving/moderate complexity  EVALUATION COMPLEXITY: Moderate  PLAN:  PT FREQUENCY: 2x/week  PT DURATION: 6 weeks  PLANNED INTERVENTIONS: 97110-Therapeutic exercises, 97530- Therapeutic activity, 97112- Neuromuscular re-education, [phone]- Self Care, 25366- Gait training, [phone]- Aquatic Therapy, Balance training, and Stair training  PLAN FOR NEXT SESSION: Check HEP:  do SciFit, step up exercise   Malika Demario Suzanne, PT 01/17/2023, 7:38 PM     Check all possible CPT codes: 97110- Therapeutic Exercise, [phone]- Neuro Re-education, [phone] - Gait Training, [phone] - Therapeutic Activities, [phone] - Self Care, and [phone] - Aquatic therapy    Check all conditions that are expected to impact treatment: Musculoskeletal disorders and Psychological or psychiatric disorders   If treatment provided at initial evaluation, no treatment charged due to lack of authorization.

## 2023-01-22 ENCOUNTER — Ambulatory Visit: Payer: MEDICAID | Attending: Internal Medicine | Admitting: Physical Therapy

## 2023-01-24 ENCOUNTER — Ambulatory Visit: Payer: MEDICAID | Admitting: Physical Therapy

## 2023-01-29 ENCOUNTER — Ambulatory Visit: Payer: MEDICAID | Admitting: Physical Therapy

## 2023-01-31 ENCOUNTER — Ambulatory Visit: Payer: MEDICAID | Admitting: Physical Therapy

## 2023-02-05 ENCOUNTER — Ambulatory Visit: Payer: MEDICAID | Admitting: Physical Therapy

## 2023-02-07 ENCOUNTER — Ambulatory Visit: Payer: MEDICAID | Admitting: Physical Therapy

## 2023-05-13 ENCOUNTER — Ambulatory Visit: Payer: MEDICAID | Attending: Physical Therapy | Admitting: Physical Therapy

## 2023-05-28 ENCOUNTER — Other Ambulatory Visit (HOSPITAL_COMMUNITY): Payer: Self-pay

## 2023-05-28 ENCOUNTER — Telehealth: Payer: Self-pay

## 2023-05-28 NOTE — Telephone Encounter (Signed)
 [facility] Patient Advocate Encounter  Insurance verification completed.    The patient is insured through [employer] [place]. Patient has Medicare and is not eligible for a copay card, but may be able to apply for patient assistance or Medicare RX Payment Plan (Patient Must reach out to their plan, if eligible for payment plan), if available.    Ran test claim for [employer]   The current 30 day co-pay is $4.  Ran test claim for MAVYRET   This medication will need a PA.   We will continue to follow to see if copay assistance is needed.  This test claim was processed through [facility]- copay amounts may vary at other pharmacies due to pharmacy/plan contracts, or as the patient moves through the different stages of their insurance plan.

## 2023-06-03 ENCOUNTER — Ambulatory Visit: Payer: MEDICAID | Attending: Internal Medicine | Admitting: Physical Therapy

## 2023-06-03 VITALS — BP 120/64 | HR 70

## 2023-06-03 DIAGNOSIS — R2689 Other abnormalities of gait and mobility: Secondary | ICD-10-CM | POA: Insufficient documentation

## 2023-06-03 DIAGNOSIS — R2681 Unsteadiness on feet: Secondary | ICD-10-CM | POA: Insufficient documentation

## 2023-06-03 DIAGNOSIS — M6281 Muscle weakness (generalized): Secondary | ICD-10-CM | POA: Diagnosis present

## 2023-06-03 DIAGNOSIS — M5459 Other low back pain: Secondary | ICD-10-CM | POA: Diagnosis present

## 2023-06-03 NOTE — Therapy (Signed)
 OUTPATIENT PHYSICAL THERAPY NEURO/THORACOLUMBAR EVALUATION   Patient Name: Adrian Alexander MRN: [id number] DOB:1961-01-31, 63 y.o., male Today's Date: 06/03/2023  END OF SESSION:  PT End of Session - 06/03/23 1146     Visit Number 1    Number of Visits 13   with eval   Date for PT Re-Evaluation 07/29/23    Authorization Type [employer]    PT Start Time 1145    PT Stop Time 1225    PT Time Calculation (min) 40 min    Equipment Utilized During Treatment Gait belt    Activity Tolerance Patient limited by fatigue    Behavior During Therapy WFL for tasks assessed/performed             Past Medical History:  Diagnosis Date   Anxiety    Bipolar affective disorder, depressed (HCC)    Hypertension    Schizo-affective schizophrenia (HCC)    Schizophrenia, schizo-affective (HCC)    Seizure (HCC)    Seizures (HCC)    Last month. gets a sensation   Past Surgical History:  Procedure Laterality Date   ANKLE SURGERY     CLOSED REDUCTION WITH HUMER PIN INSERTION     left ankle 1998   HERNIA REPAIR     while in prison   HERNIA REPAIR     Patient Active Problem List   Diagnosis Date Noted   Elevated troponin 06/29/2022   Hypertensive urgency 06/29/2022   Hypokalemia 06/29/2022   Hypocalcemia 06/29/2022   Neuropathy 06/29/2022   Marijuana abuse 06/29/2022   Pain due to onychomycosis of toenails of both feet 05/10/2021   MDD (major depressive disorder), recurrent episode, severe (HCC) 01/29/2020   Hepatitis C antibody test positive 05/21/2019   Mood disorder (HCC) 05/21/2019   Gastrointestinal hemorrhage 08/15/2017   Chest pain 03/29/2017   Gastritis 03/29/2017   Hematochezia 03/29/2017   Hematemesis 03/29/2017   Acute low back pain 12/16/2016   Dyspnea on exertion 10/03/2016   Essential hypertension 10/03/2016   External hemorrhoids 10/03/2016   Seizure disorder (HCC) 10/03/2016   Alcohol dependence with withdrawal, uncomplicated (HCC) 01/25/2015   Post traumatic stress  disorder (PTSD) 01/25/2015   Schizoaffective disorder (HCC) 01/25/2015   Cocaine abuse with cocaine-induced mood disorder (HCC) 01/25/2015   MDD (major depressive disorder) 01/24/2015   Substance induced mood disorder (HCC) 09/10/2011    PCP: Jamison Oka  REFERRING PROVIDER: Harvest Forest, MD  REFERRING DIAG: M54.50,G89.29 (ICD-10-CM) - Chronic low back pain  Rationale for Evaluation and Treatment: Rehabilitation  THERAPY DIAG:  Muscle weakness (generalized)  Other low back pain  Other abnormalities of gait and mobility  Unsteadiness on feet  ONSET DATE: 05/17/2023 (referral date)  SUBJECTIVE:  SUBJECTIVE STATEMENT: Pt reports that he had a stroke 1-2 years ago and has been having ongoing LE weakness. Pt reports he wants to work on getting his legs stronger. Pt reports he did fall about a month ago, he drags his feet when walking and he tripped over his foot. Pt reports no injuries with the fall but he did need help getting back up. Pt also reports a history of chronic low back pain.  PERTINENT HISTORY:  PMH: Anxiety, Bipolar affective disorder, depressed (HCC),Hypertension, Schizophrenia, schizo-affective (HCC),Seizure (HCC)  PAIN:  Are you having pain? Yes: NPRS scale: 8/10 Pain location: low back Pain description: sharp, constant Aggravating factors: walking, laying down trying to get back up Relieving factors: relaxing  PRECAUTIONS: Fall  RED FLAGS: None   WEIGHT BEARING RESTRICTIONS: No  FALLS:  Has patient fallen in last 6 months? Yes. Number of falls one, no injuries - did need help getting back up  LIVING ENVIRONMENT: Lives with: lives with their partner (girlfriend) Lives in: House/apartment Stairs: No Has following equipment at home: Single point  cane  OCCUPATION:  on disability  PLOF: Independent with gait, Independent with transfers, and Requires assistive device for independence  PATIENT GOALS: "get a little stronger"  NEXT MD VISIT: follow-up with referring provider in 2 months  OBJECTIVE:  Note: Objective measures were completed at Evaluation unless otherwise noted.  DIAGNOSTIC FINDINGS:  Most recent imaging: Lumbar Xray 06/30/22 IMPRESSION: 1. Unchanged moderate degenerative disc disease at L4-L5 and L5-S1.  PATIENT SURVEYS:  Modified Oswestry 14/50, 28% disability, mild disability   COGNITION: Overall cognitive status: Within functional limits for tasks assessed     SENSATION: N/T in fingers and toes (neuropathy)   POSTURE: rounded shoulders and forward head  LUMBAR ROM:   AROM eval  Flexion 50%, painful at end range and returning to upright  Extension 75%, more painful than flexion  Right lateral flexion 50%, pain on R side  Left lateral flexion 50%  Right rotation 75%, pain on R side  Left rotation 75%   (Blank rows = not tested)  LOWER EXTREMITY ROM:     Active  Right eval Left eval  Hip flexion Tight  Tight   Hip extension    Hip abduction    Hip adduction    Hip internal rotation    Hip external rotation    Knee flexion    Knee extension    Ankle dorsiflexion    Ankle plantarflexion    Ankle inversion    Ankle eversion     (Blank rows = not tested)  LOWER EXTREMITY MMT:    MMT Right eval Left eval  Hip flexion 3 3  Hip extension    Hip abduction    Hip adduction    Hip internal rotation    Hip external rotation    Knee flexion 4 3  Knee extension 4 3  Ankle dorsiflexion 3 3  Ankle plantarflexion    Ankle inversion    Ankle eversion     (Blank rows = not tested)  FUNCTIONAL TESTS:    [facility] PT Assessment - 06/03/23 1204       Ambulation/Gait   Gait velocity 32.8 ft over 14.16 sec = 2.32 ft/sec      Standardized Balance Assessment   Standardized Balance Assessment  Timed Up and Go Test;Five Times Sit to Stand    Five times sit to stand comments  24.34 sec   no UE     Timed Up and Go Test  TUG Normal TUG    Normal TUG (seconds) 15   with SPC     Functional Gait  Assessment   Gait assessed  Yes    Gait Level Surface Walks 20 ft, slow speed, abnormal gait pattern, evidence for imbalance or deviates 10-15 in outside of the 12 in walkway width. Requires more than 7 sec to ambulate 20 ft.    Change in Gait Speed Makes only minor adjustments to walking speed, or accomplishes a change in speed with significant gait deviations, deviates 10-15 in outside the 12 in walkway width, or changes speed but loses balance but is able to recover and continue walking.    Gait with Horizontal Head Turns Performs head turns smoothly with slight change in gait velocity (eg, minor disruption to smooth gait path), deviates 6-10 in outside 12 in walkway width, or uses an assistive device.    Gait with Vertical Head Turns Performs task with slight change in gait velocity (eg, minor disruption to smooth gait path), deviates 6 - 10 in outside 12 in walkway width or uses assistive device    Gait and Pivot Turn Pivot turns safely in greater than 3 sec and stops with no loss of balance, or pivot turns safely within 3 sec and stops with mild imbalance, requires small steps to catch balance.    Step Over Obstacle Cannot perform without assistance.    Gait with Narrow Base of Support Ambulates 4-7 steps.    Gait with Eyes Closed Walks 20 ft, slow speed, abnormal gait pattern, evidence for imbalance, deviates 10-15 in outside 12 in walkway width. Requires more than 9 sec to ambulate 20 ft.    Ambulating Backwards Walks 20 ft, slow speed, abnormal gait pattern, evidence for imbalance, deviates 10-15 in outside 12 in walkway width.    Steps Two feet to a stair, must use rail.    Total Score 12    FGA comment: 12/30, high fall risk              GAIT: Distance walked: various clinic  distances Assistive device utilized: Single point cane Level of assistance: Modified independence Comments: decreased B hip and knee flexion; antalgic gait, wide BOS  TREATMENT: PT Evaluation  Vitals:   06/03/23 1200  BP: 120/64  Pulse: 70  Assessed in LUE in seated position.                                                                                                                            PATIENT EDUCATION:  Education details: Eval findings, PT POC, will discuss results of OM next visit per patient request Person educated: Patient Education method: Explanation and Demonstration Education comprehension: verbalized understanding, returned demonstration, and needs further education  HOME EXERCISE PROGRAM: To be initiated  ASSESSMENT:  CLINICAL IMPRESSION: Patient is a 63 year old male referred to Neuro OPPT for chronic low back pain as well as complaints of LE weakness.   Pt's PMH is  significant for: Anxiety, Bipolar affective disorder, depressed (HCC),Hypertension, Schizophrenia, schizo-affective (HCC),Seizure (HCC). The following deficits were present during the exam: decreased lumbar ROM, decreased LE strength and ROM, increased pain. Based on his 5xSTS score, gait speed, FGA score, history of neuropathy, and fall history, pt is an increased risk for falls. Pt would benefit from skilled PT to address these impairments and functional limitations to maximize functional mobility independence.   OBJECTIVE IMPAIRMENTS: Abnormal gait, cardiopulmonary status limiting activity, decreased activity tolerance, decreased balance, decreased endurance, decreased mobility, difficulty walking, decreased ROM, decreased strength, impaired perceived functional ability, and pain.   ACTIVITY LIMITATIONS: carrying, lifting, bending, standing, squatting, stairs, and transfers  PARTICIPATION LIMITATIONS: community activity  PERSONAL FACTORS: Fitness, Time since onset of  injury/illness/exacerbation, and 3+ comorbidities:    Anxiety, Bipolar affective disorder, depressed (HCC),Hypertension, Schizophrenia, schizo-affective (HCC),Seizure (HCC)are also affecting patient's functional outcome.   REHAB POTENTIAL: Good  CLINICAL DECISION MAKING: Stable/uncomplicated  EVALUATION COMPLEXITY: Moderate   GOALS: Goals reviewed with patient? Yes  SHORT TERM GOALS: Target date: 06/24/2023   Pt will be independent with initial HEP for improved strength, balance, transfers and gait and management of pain symptoms. Baseline: Goal status: INITIAL  2.  Pt will improve 5 x STS to less than or equal to 20 seconds to demonstrate improved functional strength and transfer efficiency.  Baseline: 24.34 sec no UE (3/17) Goal status: INITIAL  3.  Pt will improve gait velocity to at least 2.5 ft/sec for improved gait efficiency and performance at mod I level  Baseline: 2.32 ft/sec mod I with SPC (3/17) Goal status: INITIAL  4.  Pt will improve FGA to 16/30 for decreased fall risk  Baseline: 12/30 (3/17) Goal status: INITIAL  5.  to be assessed and LTG set Baseline:  Goal status: INITIAL    LONG TERM GOALS: Target date: 07/15/2023   Pt will be independent with final HEP for improved strength, balance, transfers and gait and management of pain symptoms Baseline:  Goal status: INITIAL  2.  Pt will improve 5 x STS to less than or equal to 16 seconds to demonstrate improved functional strength and transfer efficiency.  Baseline: 24.34 sec no UE (3/17) Goal status: INITIAL  3.  Pt will improve gait velocity to at least 2.75 ft/sec for improved gait efficiency and performance at mod I level  Baseline: 2.32 ft/sec mod I with SPC (3/17) Goal status: INITIAL  4.  Pt will improve FGA to 20/30 for decreased fall risk  Baseline: 12/30 (3/17) Goal status: INITIAL  5.  Pt will improve his score on the Oswestry to >/= 10/50 Baseline: 14/50 (3/17) Goal status:  INITIAL  6.  Pt will improve his lumbar ROM by 25% in all limited motions Baseline: see eval Goal status: INITIAL  7.  to be assessed and LTG set Baseline:  Goal status: INITIAL  PLAN:  PT FREQUENCY: 2x/week  PT DURATION: 6 weeks  PLANNED INTERVENTIONS: 97164- PT Re-evaluation, 97110-Therapeutic exercises, 97530- Therapeutic activity, 97112- Neuromuscular re-education, 97535- Self Care, 16109- Manual therapy, [id number]- Gait training, [phone]- Orthotic Fit/training, [phone]- Electrical stimulation (manual), Patient/Family education, Balance training, Stair training, Taping, Dry Needling, Joint mobilization, Spinal mobilization, DME instructions, Cryotherapy, and Moist heat.  PLAN FOR NEXT SESSION: assess BP, assess and write LTG, initiate HEP to address chronic LBP as well as LE weakness and impaired balance (functional strengthening and endurance)   Peter Congo, PT Peter Congo, PT, DPT, CSRS  06/03/2023, 12:26 PM    For all  possible CPT codes, reference the Planned Interventions line above.     Check all conditions that are expected to impact treatment: {Conditions expected to impact treatment:Neurological condition and/or seizures, Psychological or psychiatric disorders, and Presence of Medical Equipment   If treatment provided at initial evaluation, no treatment charged due to lack of authorization.

## 2023-06-05 ENCOUNTER — Encounter: Payer: MEDICAID | Admitting: Family

## 2023-06-06 ENCOUNTER — Ambulatory Visit: Payer: MEDICAID | Admitting: Physical Therapy

## 2023-06-10 ENCOUNTER — Telehealth: Payer: Self-pay | Admitting: Physical Therapy

## 2023-06-10 ENCOUNTER — Ambulatory Visit: Payer: MEDICAID | Admitting: Physical Therapy

## 2023-06-10 NOTE — Telephone Encounter (Signed)
 Called and attempted to LVM but unable due to full mail box to inform patient second no show to PT and 3rd would be a D/C. If no show to next appointment, will discharge from PT as this time.  Maryruth Eve, PT, DPT

## 2023-06-13 ENCOUNTER — Ambulatory Visit: Payer: MEDICAID | Admitting: Physical Therapy

## 2023-06-13 ENCOUNTER — Telehealth: Payer: Self-pay | Admitting: Physical Therapy

## 2023-06-13 ENCOUNTER — Encounter: Payer: Self-pay | Admitting: Physical Therapy

## 2023-06-13 NOTE — Therapy (Signed)
 [facility] [address] [place], [place], [address] Phone: [phone]   Fax:  [phone]  Patient Details  Name: Algenis Ballin MRN: [id number] Date of Birth: 1960-08-04 Referring Provider:  No ref. provider found  Encounter Date: 06/13/2023  Patient to be discharged from PT services after No Show x 3 scheduled appointments. Attempted to call patient but his phone goes straight to voicemail and his voice mailbox is full. Patient will need a new referral to return to PT.  Peter Congo, PT Peter Congo, PT, DPT, CSRS  06/13/2023, 3:41 PM   [facility] [address] [place], [place], [address] Phone: [phone]   Fax:  [phone]

## 2023-06-17 ENCOUNTER — Ambulatory Visit: Payer: MEDICAID | Admitting: Physical Therapy

## 2023-06-20 ENCOUNTER — Ambulatory Visit: Payer: MEDICAID | Admitting: Physical Therapy

## 2023-06-24 ENCOUNTER — Ambulatory Visit: Payer: MEDICAID | Admitting: Physical Therapy

## 2023-06-27 ENCOUNTER — Ambulatory Visit: Payer: MEDICAID | Admitting: Physical Therapy

## 2023-07-01 ENCOUNTER — Ambulatory Visit: Payer: MEDICAID | Admitting: Physical Therapy
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
## 2023-07-04 ENCOUNTER — Ambulatory Visit: Payer: MEDICAID | Admitting: Physical Therapy

## 2023-07-08 ENCOUNTER — Ambulatory Visit: Payer: MEDICAID | Admitting: Physical Therapy

## 2023-07-11 ENCOUNTER — Ambulatory Visit: Payer: MEDICAID | Admitting: Physical Therapy

## 2023-07-15 ENCOUNTER — Ambulatory Visit: Payer: MEDICAID | Admitting: Physical Therapy

## 2023-07-25 ENCOUNTER — Telehealth: Payer: Self-pay | Admitting: *Deleted

## 2023-07-25 NOTE — Telephone Encounter (Signed)
 Calls unable to be completed due to issues with both numbers

## 2023-07-25 NOTE — Telephone Encounter (Signed)
 Both phone numbers unable to call through - HE

## 2023-07-29 ENCOUNTER — Institutional Professional Consult (permissible substitution) (INDEPENDENT_AMBULATORY_CARE_PROVIDER_SITE_OTHER): Payer: MEDICAID | Admitting: Otolaryngology

## 2023-07-29 ENCOUNTER — Ambulatory Visit (INDEPENDENT_AMBULATORY_CARE_PROVIDER_SITE_OTHER): Payer: MEDICAID | Admitting: Audiology

## 2023-09-05 ENCOUNTER — Ambulatory Visit: Payer: MEDICAID | Admitting: Physical Therapy

## 2023-09-05 ENCOUNTER — Encounter: Payer: Self-pay | Admitting: Physical Therapy

## 2023-09-05 ENCOUNTER — Ambulatory Visit: Payer: MEDICAID | Attending: Internal Medicine | Admitting: Physical Therapy

## 2023-09-05 VITALS — BP 142/86 | HR 68

## 2023-09-05 DIAGNOSIS — M6281 Muscle weakness (generalized): Secondary | ICD-10-CM | POA: Insufficient documentation

## 2023-09-05 DIAGNOSIS — R2689 Other abnormalities of gait and mobility: Secondary | ICD-10-CM | POA: Insufficient documentation

## 2023-09-05 DIAGNOSIS — R2681 Unsteadiness on feet: Secondary | ICD-10-CM | POA: Insufficient documentation

## 2023-09-05 DIAGNOSIS — M5459 Other low back pain: Secondary | ICD-10-CM | POA: Insufficient documentation

## 2023-09-05 NOTE — Therapy (Signed)
 OUTPATIENT PHYSICAL THERAPY THORACOLUMBAR EVALUATION   Patient Name: Adrian Alexander MRN: [id number] DOB:[birth year], 63 y.o., male Today's Date: 09/05/2023  END OF SESSION:  PT End of Session - 09/05/23 1234     Visit Number 1    Number of Visits 5   plus eval   Date for PT Re-Evaluation 10/10/23    Authorization Type [employer]    PT Start Time 1230    PT Stop Time 1255    PT Time Calculation (min) 25 min    Activity Tolerance Patient tolerated treatment well    Behavior During Therapy WFL for tasks assessed/performed          Past Medical History:  Diagnosis Date   Anxiety    Bipolar affective disorder, depressed (HCC)    Hypertension    Schizo-affective schizophrenia (HCC)    Schizophrenia, schizo-affective (HCC)    Seizure (HCC)    Seizures (HCC)    Last month. gets a sensation   Past Surgical History:  Procedure Laterality Date   ANKLE SURGERY     CLOSED REDUCTION WITH HUMER PIN INSERTION     left ankle 1998   HERNIA REPAIR     while in prison   HERNIA REPAIR     Patient Active Problem List   Diagnosis Date Noted   Elevated troponin 06/29/2022   Hypertensive urgency 06/29/2022   Hypokalemia 06/29/2022   Hypocalcemia 06/29/2022   Neuropathy 06/29/2022   Marijuana abuse 06/29/2022   Pain due to onychomycosis of toenails of both feet 05/10/2021   MDD (major depressive disorder), recurrent episode, severe (HCC) 01/29/2020   Hepatitis C antibody test positive 05/21/2019   Mood disorder (HCC) 05/21/2019   Gastrointestinal hemorrhage 08/15/2017   Chest pain 03/29/2017   Gastritis 03/29/2017   Hematochezia 03/29/2017   Hematemesis 03/29/2017   Acute low back pain 12/16/2016   Dyspnea on exertion 10/03/2016   Essential hypertension 10/03/2016   External hemorrhoids 10/03/2016   Seizure disorder (HCC) 10/03/2016   Alcohol  dependence with withdrawal, uncomplicated (HCC) 01/25/2015   Post traumatic stress disorder (PTSD) 01/25/2015   Schizoaffective disorder  (HCC) 01/25/2015   Cocaine abuse with cocaine-induced mood disorder (HCC) 01/25/2015   MDD (major depressive disorder) 01/24/2015   Substance induced mood disorder (HCC) 09/10/2011    PCP: Nohemi Batters, MD  REFERRING PROVIDER: Nohemi Batters, MD  REFERRING DIAG: M54.50 (ICD-10-CM) - Low back pain, unspecified  Rationale for Evaluation and Treatment: Rehabilitation  THERAPY DIAG:  Muscle weakness (generalized)  Other low back pain  Other abnormalities of gait and mobility  Unsteadiness on feet  ONSET DATE: 08/19/2023 (referral)   SUBJECTIVE:  SUBJECTIVE STATEMENT: Pt presents w/SPC. States he has not had any falls recently because he is very careful with what shoes he wears and does not leave the house. States his back is not hurting, just a constant soreness. Wants to work on his balance.   PERTINENT HISTORY:  Anxiety, Bipolar affective disorder, depressed (HCC),Hypertension, Schizophrenia, schizo-affective (HCC),Seizure (HCC)  PAIN:  Are you having pain? No  PRECAUTIONS: Fall  RED FLAGS: None   WEIGHT BEARING RESTRICTIONS: No  FALLS:  Has patient fallen in last 6 months? No  LIVING ENVIRONMENT: Lives with: lives with an adult companion Lives in: House/apartment Stairs: No Has following equipment at home: Single point cane  OCCUPATION: On disability   PLOF: Requires assistive device for independence  PATIENT GOALS:  to try to get my legs strong and keep them going   NEXT MD VISIT: Has visit w/PCP today (6/19)  OBJECTIVE:  Note: Objective measures were completed at Evaluation unless otherwise noted.  DIAGNOSTIC FINDINGS:  X-ray of lumbar spine from 06/2022  IMPRESSION: 1. Unchanged moderate degenerative disc disease at L4-L5 and L5-S1.  PATIENT SURVEYS:   ABC scale 32.5%  COGNITION: Overall cognitive status: Impaired     SENSATION: Pt reports peripheral neuropathy in both feet   POSTURE: rounded shoulders, forward head, and increased thoracic kyphosis  LUMBAR ROM:   AROM eval  Flexion   Extension   Right lateral flexion   Left lateral flexion   Right rotation   Left rotation    (Blank rows = not tested)  LOWER EXTREMITY ROM:     Active  Right eval Left eval  Hip flexion    Hip extension    Hip abduction    Hip adduction    Hip internal rotation    Hip external rotation    Knee flexion    Knee extension    Ankle dorsiflexion    Ankle plantarflexion    Ankle inversion    Ankle eversion     (Blank rows = not tested)  LOWER EXTREMITY MMT:    MMT Right eval Left eval  Hip flexion    Hip extension    Hip abduction    Hip adduction    Hip internal rotation    Hip external rotation    Knee flexion    Knee extension    Ankle dorsiflexion    Ankle plantarflexion    Ankle inversion    Ankle eversion     (Blank rows = not tested)  FUNCTIONAL TESTS:   [facility] PT Assessment - 09/05/23 1251       Transfers   Five time sit to stand comments  23.32s no UE support   RPE of 7/10. Poor eccentric control     Ambulation/Gait   Gait velocity 32.8' over 12.34s = 2.65 ft/s w/SPC   Pt reports he walked more quickly than usual because he was trying to impress therapist          GAIT: Distance walked: various clinic distances  Assistive device utilized: Single point cane Level of assistance: Modified independence Comments: Pt ambulates w/crouched posture and shuffles feet. R knee extension thrust   VITALS  Vitals:   09/05/23 1239  BP: (!) 142/86  Pulse: 68     TREATMENT:  Self-care Pt inquiring about getting a referral to psychology. Informed pt to speak to PCP about this at  appointment today and also provided resources to behavioral health  Assessed vitals (See above) and WNL Provided copy of HEP from previous POC (see below)      PATIENT EDUCATION:  Education details: POC, eval findings  Person educated: Patient Education method: Explanation Education comprehension: verbalized understanding and needs further education  HOME EXERCISE PROGRAM: To be reviewed from previous POC: [id number]  ASSESSMENT:  CLINICAL IMPRESSION: Patient is a 63 year old male referred to Neuro OPPT for low back pain. Pt's PMH is significant for: Anxiety, Bipolar affective disorder, depressed (HCC),Hypertension, Schizophrenia, schizo-affective (HCC),Seizure (HCC). The following deficits were present during the exam: impaired strength, postural deficits, impaired sensation, increased fall risk per his score on ABC scale and decreased endurance per his time on 5x STS. Pt would benefit from skilled PT to address these impairments and functional limitations to maximize functional mobility independence.    OBJECTIVE IMPAIRMENTS: Abnormal gait, decreased activity tolerance, decreased balance, decreased cognition, decreased coordination, decreased endurance, decreased knowledge of use of DME, decreased mobility, difficulty walking, decreased strength, impaired sensation, improper body mechanics, postural dysfunction, and pain  ACTIVITY LIMITATIONS: carrying, lifting, standing, squatting, stairs, bed mobility, and locomotion level  PARTICIPATION LIMITATIONS: meal prep, cleaning, laundry, interpersonal relationship, driving, shopping, community activity, and yard work  PERSONAL FACTORS: Age, Behavior pattern, Past/current experiences, and 1 comorbidity: chronic low back pain are also affecting patient's functional outcome.   REHAB POTENTIAL: Fair due to history of noncompliance   CLINICAL DECISION MAKING: Evolving/moderate complexity  EVALUATION COMPLEXITY: Moderate   GOALS: Goals  reviewed with patient? Yes  STG = LTG due to POC length    LONG TERM GOALS: Target date: 10/03/2023   Pt will be independent with final HEP for improved strength, balance, transfers and gait.  Baseline:  Goal status: INITIAL  2.  Pt will score >/= 50% on ABC scale for reduced fall risk and improved functional mobility  Baseline: 32.5%  Goal status: INITIAL  3.  Pt will improve 5 x STS to less than or equal to 18 seconds to demonstrate improved functional strength and transfer efficiency.   Baseline: 23.32s no UE support  Goal status: INITIAL   PLAN:  PT FREQUENCY: 1x/week scheduling 1 visit at a time  PT DURATION: 4 weeks  PLANNED INTERVENTIONS: 97164- PT Re-evaluation, 97750- Physical Performance Testing, 97110-Therapeutic exercises, 97530- Therapeutic activity, 97112- Neuromuscular re-education, 97535- Self Care, 16109- Manual therapy, [phone]- Gait training, [phone]- Aquatic Therapy, [phone]- Electrical stimulation (manual), Patient/Family education, Balance training, Stair training, Joint mobilization, Spinal mobilization, Vestibular training, and DME instructions.  PLAN FOR NEXT SESSION: review HEP from previous POC and update (provided new printout of it on eval). Only scheduling 1 visit at a time due to history of no shows. May be better to treat in private room. Global strength and conditioning    Check all possible CPT codes: See Planned Interventions List for Planned CPT Codes    Check all conditions that are expected to impact treatment: Psychological or psychiatric disorders   If treatment provided at initial evaluation, no treatment charged due to lack of authorization.    Shahid Flori E Alesi Zachery, PT, DPT 09/05/2023, 1:08 PM

## 2023-09-27 ENCOUNTER — Ambulatory Visit: Payer: MEDICAID | Attending: Internal Medicine

## 2023-09-27 VITALS — BP 182/110

## 2023-09-27 DIAGNOSIS — R2681 Unsteadiness on feet: Secondary | ICD-10-CM | POA: Diagnosis present

## 2023-09-27 DIAGNOSIS — M5459 Other low back pain: Secondary | ICD-10-CM | POA: Insufficient documentation

## 2023-09-27 DIAGNOSIS — R2689 Other abnormalities of gait and mobility: Secondary | ICD-10-CM | POA: Insufficient documentation

## 2023-09-27 DIAGNOSIS — M6281 Muscle weakness (generalized): Secondary | ICD-10-CM | POA: Insufficient documentation

## 2023-09-27 NOTE — Therapy (Signed)
 OUTPATIENT PHYSICAL THERAPY THORACOLUMBAR EVALUATION   Patient Name: Adrian Alexander MRN: [id number] DOB:June 16, 1960, 63 y.o., male Today's Date: 09/27/2023  END OF SESSION:  PT End of Session - 09/27/23 1145     Visit Number 2    Number of Visits 5   plus eval   Date for PT Re-Evaluation 10/10/23    Authorization Type [employer]    PT Start Time 1145    PT Stop Time 1238    PT Time Calculation (min) 53 min    Activity Tolerance Patient tolerated treatment well    Behavior During Therapy WFL for tasks assessed/performed           Past Medical History:  Diagnosis Date   Anxiety    Bipolar affective disorder, depressed (HCC)    Hypertension    Schizo-affective schizophrenia (HCC)    Schizophrenia, schizo-affective (HCC)    Seizure (HCC)    Seizures (HCC)    Last month. gets a sensation   Past Surgical History:  Procedure Laterality Date   ANKLE SURGERY     CLOSED REDUCTION WITH HUMER PIN INSERTION     left ankle 1998   HERNIA REPAIR     while in prison   HERNIA REPAIR     Patient Active Problem List   Diagnosis Date Noted   Elevated troponin 06/29/2022   Hypertensive urgency 06/29/2022   Hypokalemia 06/29/2022   Hypocalcemia 06/29/2022   Neuropathy 06/29/2022   Marijuana abuse 06/29/2022   Pain due to onychomycosis of toenails of both feet 05/10/2021   MDD (major depressive disorder), recurrent episode, severe (HCC) 01/29/2020   Hepatitis C antibody test positive 05/21/2019   Mood disorder (HCC) 05/21/2019   Gastrointestinal hemorrhage 08/15/2017   Chest pain 03/29/2017   Gastritis 03/29/2017   Hematochezia 03/29/2017   Hematemesis 03/29/2017   Acute low back pain 12/16/2016   Dyspnea on exertion 10/03/2016   Essential hypertension 10/03/2016   External hemorrhoids 10/03/2016   Seizure disorder (HCC) 10/03/2016   Alcohol  dependence with withdrawal, uncomplicated (HCC) 01/25/2015   Post traumatic stress disorder (PTSD) 01/25/2015   Schizoaffective disorder  (HCC) 01/25/2015   Cocaine abuse with cocaine-induced mood disorder (HCC) 01/25/2015   MDD (major depressive disorder) 01/24/2015   Substance induced mood disorder (HCC) 09/10/2011    PCP: Roanna Ezekiel NOVAK, MD  REFERRING PROVIDER: Roanna Ezekiel NOVAK, MD  REFERRING DIAG: M54.50 (ICD-10-CM) - Low back pain, unspecified  Rationale for Evaluation and Treatment: Rehabilitation  THERAPY DIAG:  Muscle weakness (generalized)  Other low back pain  Other abnormalities of gait and mobility  Unsteadiness on feet  ONSET DATE: 08/19/2023 (referral)   SUBJECTIVE:  SUBJECTIVE STATEMENT: Pt present with one walking stick set at very low height. When first asked on how long he has back pain, he said was 2-3 years but pt was asked based on chart review he had mentioned back pain has been going on for 10+ years in past. Patient mentioned that he thinks he had stroke but there is no documentation of that in chart.   PERTINENT HISTORY:  Anxiety, Bipolar affective disorder, depressed (HCC),Hypertension, Schizophrenia, schizo-affective (HCC),Seizure (HCC)  PAIN:  Are you having pain? Yes: NPRS scale: 7-8 Pain location: lower back Pain description: sharp, tightness, radiating Aggravating factors: standing, walking, bending, twisting Relieving factors: none  PRECAUTIONS: Fall  RED FLAGS: None   WEIGHT BEARING RESTRICTIONS: No  FALLS:  Has patient fallen in last 6 months? No  LIVING ENVIRONMENT: Lives with: lives with an adult companion Lives in: House/apartment Stairs: No Has following equipment at home: Single point cane  OCCUPATION: On disability   PLOF: Requires assistive device for independence  PATIENT GOALS:  to try to get my legs strong and keep them going   NEXT MD VISIT: Has visit w/PCP  today (6/19)  OBJECTIVE:  Note: Objective measures were completed at Evaluation unless otherwise noted.  DIAGNOSTIC FINDINGS:  X-ray of lumbar spine from 06/2022  IMPRESSION: 1. Unchanged moderate degenerative disc disease at L4-L5 and L5-S1.  PATIENT SURVEYS:  ABC scale 32.5%  COGNITION: Overall cognitive status: Impaired     SENSATION: Pt reports peripheral neuropathy in both feet   POSTURE: rounded shoulders, forward head, and increased thoracic kyphosis  LUMBAR ROM:   AROM eval  Flexion   Extension   Right lateral flexion   Left lateral flexion   Right rotation   Left rotation    (Blank rows = not tested)  LOWER EXTREMITY ROM:     Active  Right eval Left eval  Hip flexion    Hip extension    Hip abduction    Hip adduction    Hip internal rotation    Hip external rotation    Knee flexion    Knee extension    Ankle dorsiflexion    Ankle plantarflexion    Ankle inversion    Ankle eversion     (Blank rows = not tested)  LOWER EXTREMITY MMT:    MMT Right 09/27/23 Left 09/27/23  Hip flexion 2+ 2+  Hip extension    Hip abduction 2+ 2+  Hip adduction    Hip internal rotation 2+ 2+  Hip external rotation 3+ 3+  Knee flexion 4 4  Knee extension 4 4  Ankle dorsiflexion 3+ 3+  Ankle plantarflexion 2+ 2+  Ankle inversion    Ankle eversion     (Blank rows = not tested)  FUNCTIONAL TESTS:      GAIT: Distance walked: various clinic distances  Assistive device utilized: Single point cane Level of assistance: Modified independence Comments: Pt ambulates w/crouched posture and shuffles feet. R knee extension thrust   VITALS  Vitals:   09/27/23 1232  BP: (!) 182/110      TREATMENT:  Self-care Reviewed patient's history. We discussed that his back pain has been going on for long time and progression of bil LE weakness  has been ongoing for ~5+ years with worsening in last 3 years. Patient doesn't have any other neurological conditions that can contribute to her bil LE weakness. Reviewed MRI from 2021 which showed some DJD and DDD at L4-S1. Patient was educated that he should contact back specialist for consultation and possibly to get updated HEP as I am suspecting his Bil LE weakness may be due to lumbar radiculopathy in nature. Pt wanted PT to follow up with PCP. Pt was educated on 3 more sessions of PT for getting an updated HEP. Pt educated on importance of BP management as his BP was very high (measured at end of the session) Pt was asking for referral to fitness center and patient was advised that insurance companies do not pay for fitness clubs. Pt currently has [employer] and PT was not aware of any incentives from that company.   TherEx: Following exercises performed with patient. Pt educated on improtance of compliance with HEP. Exercises - Supine Lower Trunk Rotation  - 1 x daily - 7 x weekly - 2 sets - 10 reps - Supine Heel Slide  - 1 x daily - 7 x weekly - 2 sets - 10 reps - Supine Active Straight Leg Raise  - 1 x daily - 7 x weekly - 2 sets - 10 reps - Supine Bridge  - 1 x daily - 7 x weekly - 2 sets - 10 reps - Clamshell  - 1 x daily - 7 x weekly - 2 sets - 10 reps    PATIENT EDUCATION:  Education details: POC, eval findings  Person educated: Patient Education method: Explanation Education comprehension: verbalized understanding and needs further education  HOME EXERCISE PROGRAM: Access Code: [email or web address] URL: [email or web address] Date: 09/27/2023 Prepared by: Raj Blanch  Exercises - Supine Lower Trunk Rotation  - 1 x daily - 7 x weekly - 2 sets - 10 reps - Supine Heel Slide  - 1 x daily - 7 x weekly - 2 sets - 10 reps - Supine Active Straight Leg Raise  - 1 x daily - 7 x weekly - 2 sets - 10 reps - Supine Bridge  - 1 x daily - 7 x weekly - 2 sets - 10 reps - Clamshell  -  1 x daily - 7 x weekly - 2 sets - 10 reps  ASSESSMENT:  CLINICAL IMPRESSION: Patient demonstrates significant weakness in proximal and distal LE with constant sensory changes. Suspecting severe LE radiculopathy at this time and pt was advised to follow up with PCP for referral to back specialist. Pt was given HEP to initiate self management. Pt will benefit from continued skilled PT through initial POC to establish HEP program and to ensure patient is taking proper steps to diagnose his LE weakness.   OBJECTIVE IMPAIRMENTS: Abnormal gait, decreased activity tolerance, decreased balance, decreased cognition, decreased coordination, decreased endurance, decreased knowledge of use of DME, decreased mobility, difficulty walking, decreased strength, impaired sensation, improper body mechanics, postural dysfunction, and pain  ACTIVITY LIMITATIONS: carrying, lifting, standing, squatting, stairs, bed mobility, and locomotion level  PARTICIPATION LIMITATIONS: meal prep, cleaning, laundry, interpersonal relationship, driving, shopping, community activity, and yard work  PERSONAL FACTORS: Age, Behavior pattern, Past/current experiences, and 1 comorbidity: chronic low back pain are also affecting patient's functional outcome.   REHAB POTENTIAL: Fair due to history of noncompliance   CLINICAL DECISION  MAKING: Evolving/moderate complexity  EVALUATION COMPLEXITY: Moderate   GOALS: Goals reviewed with patient? Yes  STG = LTG due to POC length    LONG TERM GOALS: Target date: 10/03/2023   Pt will be independent with final HEP for improved strength, balance, transfers and gait.  Baseline:  Goal status: INITIAL  2.  Pt will score >/= 50% on ABC scale for reduced fall risk and improved functional mobility  Baseline: 32.5%  Goal status: INITIAL  3.  Pt will improve 5 x STS to less than or equal to 18 seconds to demonstrate improved functional strength and transfer efficiency.   Baseline: 23.32s  no UE support  Goal status: INITIAL   PLAN:  PT FREQUENCY: 1x/week scheduling 1 visit at a time  PT DURATION: 4 weeks  PLANNED INTERVENTIONS: 97164- PT Re-evaluation, 97750- Physical Performance Testing, 97110-Therapeutic exercises, 97530- Therapeutic activity, 97112- Neuromuscular re-education, 97535- Self Care, 02859- Manual therapy, [phone]- Gait training, [phone]- Aquatic Therapy, [phone]- Electrical stimulation (manual), Patient/Family education, Balance training, Stair training, Joint mobilization, Spinal mobilization, Vestibular training, and DME instructions.  PLAN FOR NEXT SESSION: review HEP from previous POC and update (provided new printout of it on eval). Only scheduling 1 visit at a time due to history of no shows. May be better to treat in private room. Global strength and conditioning    Check all possible CPT codes: See Planned Interventions List for Planned CPT Codes    Check all conditions that are expected to impact treatment: Psychological or psychiatric disorders   If treatment provided at initial evaluation, no treatment charged due to lack of authorization.    Raj LOISE Blanch, PT, DPT 09/27/2023, 2:35 PM

## 2023-10-02 ENCOUNTER — Ambulatory Visit: Payer: MEDICAID

## 2023-10-02 NOTE — Therapy (Incomplete)
 OUTPATIENT PHYSICAL THERAPY THORACOLUMBAR EVALUATION   Patient Name: Adrian Alexander MRN: [id number] DOB:12/10/1960, 63 y.o., male Today's Date: 10/02/2023  END OF SESSION:     Past Medical History:  Diagnosis Date   Anxiety    Bipolar affective disorder, depressed (HCC)    Hypertension    Schizo-affective schizophrenia (HCC)    Schizophrenia, schizo-affective (HCC)    Seizure (HCC)    Seizures (HCC)    Last month. gets a sensation   Past Surgical History:  Procedure Laterality Date   ANKLE SURGERY     CLOSED REDUCTION WITH HUMER PIN INSERTION     left ankle 1998   HERNIA REPAIR     while in prison   HERNIA REPAIR     Patient Active Problem List   Diagnosis Date Noted   Elevated troponin 06/29/2022   Hypertensive urgency 06/29/2022   Hypokalemia 06/29/2022   Hypocalcemia 06/29/2022   Neuropathy 06/29/2022   Marijuana abuse 06/29/2022   Pain due to onychomycosis of toenails of both feet 05/10/2021   MDD (major depressive disorder), recurrent episode, severe (HCC) 01/29/2020   Hepatitis C antibody test positive 05/21/2019   Mood disorder (HCC) 05/21/2019   Gastrointestinal hemorrhage 08/15/2017   Chest pain 03/29/2017   Gastritis 03/29/2017   Hematochezia 03/29/2017   Hematemesis 03/29/2017   Acute low back pain 12/16/2016   Dyspnea on exertion 10/03/2016   Essential hypertension 10/03/2016   External hemorrhoids 10/03/2016   Seizure disorder (HCC) 10/03/2016   Alcohol  dependence with withdrawal, uncomplicated (HCC) 01/25/2015   Post traumatic stress disorder (PTSD) 01/25/2015   Schizoaffective disorder (HCC) 01/25/2015   Cocaine abuse with cocaine-induced mood disorder (HCC) 01/25/2015   MDD (major depressive disorder) 01/24/2015   Substance induced mood disorder (HCC) 09/10/2011    PCP: Roanna Ezekiel NOVAK, MD  REFERRING PROVIDER: Roanna Ezekiel NOVAK, MD  REFERRING DIAG: M54.50 (ICD-10-CM) - Low back pain, unspecified  Rationale for Evaluation and  Treatment: Rehabilitation  THERAPY DIAG:  No diagnosis found.  ONSET DATE: 08/19/2023 (referral)   SUBJECTIVE:                                                                                                                                                                                           SUBJECTIVE STATEMENT: Pt present with one walking stick set at very low height. When first asked on how long he has back pain, he said was 2-3 years but pt was asked based on chart review he had mentioned back pain has been going on for 10+ years in past. Patient mentioned that he thinks he had stroke but there is no documentation of that in  chart.   PERTINENT HISTORY:  Anxiety, Bipolar affective disorder, depressed (HCC),Hypertension, Schizophrenia, schizo-affective (HCC),Seizure (HCC)  PAIN:  Are you having pain? Yes: NPRS scale: 7-8 Pain location: lower back Pain description: sharp, tightness, radiating Aggravating factors: standing, walking, bending, twisting Relieving factors: none  PRECAUTIONS: Fall  RED FLAGS: None   WEIGHT BEARING RESTRICTIONS: No  FALLS:  Has patient fallen in last 6 months? No  LIVING ENVIRONMENT: Lives with: lives with an adult companion Lives in: House/apartment Stairs: No Has following equipment at home: Single point cane  OCCUPATION: On disability   PLOF: Requires assistive device for independence  PATIENT GOALS:  to try to get my legs strong and keep them going   NEXT MD VISIT: Has visit w/PCP today (6/19)  OBJECTIVE:  Note: Objective measures were completed at Evaluation unless otherwise noted.  DIAGNOSTIC FINDINGS:  X-ray of lumbar spine from 06/2022  IMPRESSION: 1. Unchanged moderate degenerative disc disease at L4-L5 and L5-S1.  PATIENT SURVEYS:  ABC scale 32.5%  COGNITION: Overall cognitive status: Impaired     SENSATION: Pt reports peripheral neuropathy in both feet   POSTURE: rounded shoulders, forward head, and  increased thoracic kyphosis  LUMBAR ROM:   AROM eval  Flexion   Extension   Right lateral flexion   Left lateral flexion   Right rotation   Left rotation    (Blank rows = not tested)  LOWER EXTREMITY ROM:     Active  Right eval Left eval  Hip flexion    Hip extension    Hip abduction    Hip adduction    Hip internal rotation    Hip external rotation    Knee flexion    Knee extension    Ankle dorsiflexion    Ankle plantarflexion    Ankle inversion    Ankle eversion     (Blank rows = not tested)  LOWER EXTREMITY MMT:    MMT Right 09/27/23 Left 09/27/23  Hip flexion 2+ 2+  Hip extension    Hip abduction 2+ 2+  Hip adduction    Hip internal rotation 2+ 2+  Hip external rotation 3+ 3+  Knee flexion 4 4  Knee extension 4 4  Ankle dorsiflexion 3+ 3+  Ankle plantarflexion 2+ 2+  Ankle inversion    Ankle eversion     (Blank rows = not tested)  FUNCTIONAL TESTS:      GAIT: Distance walked: various clinic distances  Assistive device utilized: Single point cane Level of assistance: Modified independence Comments: Pt ambulates w/crouched posture and shuffles feet. R knee extension thrust   VITALS  There were no vitals filed for this visit.     TREATMENT:                                                                                                                               Self-care Reviewed patient's history. We discussed that his back pain has been going on for long  time and progression of bil LE weakness has been ongoing for ~5+ years with worsening in last 3 years. Patient doesn't have any other neurological conditions that can contribute to her bil LE weakness. Reviewed MRI from 2021 which showed some DJD and DDD at L4-S1. Patient was educated that he should contact back specialist for consultation and possibly to get updated HEP as I am suspecting his Bil LE weakness may be due to lumbar radiculopathy in nature. Pt wanted PT to follow up with  PCP. Pt was educated on 3 more sessions of PT for getting an updated HEP. Pt educated on importance of BP management as his BP was very high (measured at end of the session) Pt was asking for referral to fitness center and patient was advised that insurance companies do not pay for fitness clubs. Pt currently has [employer] and PT was not aware of any incentives from that company.   TherEx: Following exercises performed with patient. Pt educated on improtance of compliance with HEP. Exercises - Supine Lower Trunk Rotation  - 1 x daily - 7 x weekly - 2 sets - 10 reps - Supine Heel Slide  - 1 x daily - 7 x weekly - 2 sets - 10 reps - Supine Active Straight Leg Raise  - 1 x daily - 7 x weekly - 2 sets - 10 reps - Supine Bridge  - 1 x daily - 7 x weekly - 2 sets - 10 reps - Clamshell  - 1 x daily - 7 x weekly - 2 sets - 10 reps    PATIENT EDUCATION:  Education details: POC, eval findings  Person educated: Patient Education method: Explanation Education comprehension: verbalized understanding and needs further education  HOME EXERCISE PROGRAM: Access Code: [email or web address] URL: [email or web address] Date: 09/27/2023 Prepared by: Raj Blanch  Exercises - Supine Lower Trunk Rotation  - 1 x daily - 7 x weekly - 2 sets - 10 reps - Supine Heel Slide  - 1 x daily - 7 x weekly - 2 sets - 10 reps - Supine Active Straight Leg Raise  - 1 x daily - 7 x weekly - 2 sets - 10 reps - Supine Bridge  - 1 x daily - 7 x weekly - 2 sets - 10 reps - Clamshell  - 1 x daily - 7 x weekly - 2 sets - 10 reps  ASSESSMENT:  CLINICAL IMPRESSION: Patient demonstrates significant weakness in proximal and distal LE with constant sensory changes. Suspecting severe LE radiculopathy at this time and pt was advised to follow up with PCP for referral to back specialist. Pt was given HEP to initiate self management. Pt will benefit from continued skilled PT through initial POC to establish HEP program and to ensure  patient is taking proper steps to diagnose his LE weakness.   OBJECTIVE IMPAIRMENTS: Abnormal gait, decreased activity tolerance, decreased balance, decreased cognition, decreased coordination, decreased endurance, decreased knowledge of use of DME, decreased mobility, difficulty walking, decreased strength, impaired sensation, improper body mechanics, postural dysfunction, and pain  ACTIVITY LIMITATIONS: carrying, lifting, standing, squatting, stairs, bed mobility, and locomotion level  PARTICIPATION LIMITATIONS: meal prep, cleaning, laundry, interpersonal relationship, driving, shopping, community activity, and yard work  PERSONAL FACTORS: Age, Behavior pattern, Past/current experiences, and 1 comorbidity: chronic low back pain are also affecting patient's functional outcome.   REHAB POTENTIAL: Fair due to history of noncompliance   CLINICAL DECISION MAKING: Evolving/moderate complexity  EVALUATION COMPLEXITY: Moderate   GOALS: Goals reviewed with patient? Yes  STG = LTG due to POC length    LONG TERM GOALS: Target date: 10/03/2023   Pt will be independent with final HEP for improved strength, balance, transfers and gait.  Baseline:  Goal status: INITIAL  2.  Pt will score >/= 50% on ABC scale for reduced fall risk and improved functional mobility  Baseline: 32.5%  Goal status: INITIAL  3.  Pt will improve 5 x STS to less than or equal to 18 seconds to demonstrate improved functional strength and transfer efficiency.   Baseline: 23.32s no UE support  Goal status: INITIAL   PLAN:  PT FREQUENCY: 1x/week scheduling 1 visit at a time  PT DURATION: 4 weeks  PLANNED INTERVENTIONS: 97164- PT Re-evaluation, 97750- Physical Performance Testing, 97110-Therapeutic exercises, 97530- Therapeutic activity, 97112- Neuromuscular re-education, 97535- Self Care, [id number]- Manual therapy, [phone]- Gait training, [phone]- Aquatic Therapy, [phone]- Electrical stimulation (manual), Patient/Family  education, Balance training, Stair training, Joint mobilization, Spinal mobilization, Vestibular training, and DME instructions.  PLAN FOR NEXT SESSION: review HEP from previous POC and update (provided new printout of it on eval). Only scheduling 1 visit at a time due to history of no shows. May be better to treat in private room. Global strength and conditioning    Check all possible CPT codes: See Planned Interventions List for Planned CPT Codes    Check all conditions that are expected to impact treatment: Psychological or psychiatric disorders   If treatment provided at initial evaluation, no treatment charged due to lack of authorization.    Raj LOISE Blanch, PT, DPT 10/02/2023, 8:19 AM

## 2023-10-21 ENCOUNTER — Other Ambulatory Visit: Payer: Self-pay | Admitting: Internal Medicine

## 2023-10-21 DIAGNOSIS — R29898 Other symptoms and signs involving the musculoskeletal system: Secondary | ICD-10-CM

## 2023-11-03 ENCOUNTER — Other Ambulatory Visit: Payer: MEDICAID

## 2023-11-22 ENCOUNTER — Ambulatory Visit
Admission: RE | Admit: 2023-11-22 | Discharge: 2023-11-22 | Disposition: A | Discharge status: Home / Self Care | Payer: MEDICAID | Source: Ambulatory Visit | Attending: Internal Medicine | Admitting: Internal Medicine

## 2023-11-22 DIAGNOSIS — R29898 Other symptoms and signs involving the musculoskeletal system: Secondary | ICD-10-CM
